# Patient Record
Sex: Male | Born: 1955 | Race: Black or African American | Hispanic: No | Smoking: Current every day smoker
Health system: Southern US, Community
[De-identification: ages and names within clinical notes are randomized; demographics above are authoritative.]

## PROBLEM LIST (undated history)

## (undated) DIAGNOSIS — I509 Heart failure, unspecified: Secondary | ICD-10-CM

## (undated) DIAGNOSIS — I10 Essential (primary) hypertension: Secondary | ICD-10-CM

## (undated) DIAGNOSIS — E119 Type 2 diabetes mellitus without complications: Secondary | ICD-10-CM

## (undated) HISTORY — PX: CARDIAC VALVE REPLACEMENT: SHX585

---

## 2017-05-26 ENCOUNTER — Encounter (HOSPITAL_COMMUNITY): Payer: Self-pay

## 2017-05-26 ENCOUNTER — Other Ambulatory Visit: Payer: Self-pay

## 2017-05-26 ENCOUNTER — Emergency Department (HOSPITAL_COMMUNITY)
Admission: EM | Admit: 2017-05-26 | Discharge: 2017-05-26 | Disposition: A | Discharge status: Home / Self Care | Payer: Medicare Other | Attending: Emergency Medicine | Admitting: Emergency Medicine

## 2017-05-26 DIAGNOSIS — R51 Headache: Secondary | ICD-10-CM | POA: Insufficient documentation

## 2017-05-26 DIAGNOSIS — R112 Nausea with vomiting, unspecified: Secondary | ICD-10-CM | POA: Insufficient documentation

## 2017-05-26 DIAGNOSIS — R197 Diarrhea, unspecified: Secondary | ICD-10-CM | POA: Diagnosis not present

## 2017-05-26 DIAGNOSIS — E876 Hypokalemia: Secondary | ICD-10-CM | POA: Insufficient documentation

## 2017-05-26 DIAGNOSIS — F17210 Nicotine dependence, cigarettes, uncomplicated: Secondary | ICD-10-CM | POA: Diagnosis not present

## 2017-05-26 DIAGNOSIS — E871 Hypo-osmolality and hyponatremia: Secondary | ICD-10-CM

## 2017-05-26 DIAGNOSIS — E119 Type 2 diabetes mellitus without complications: Secondary | ICD-10-CM | POA: Insufficient documentation

## 2017-05-26 DIAGNOSIS — I10 Essential (primary) hypertension: Secondary | ICD-10-CM | POA: Insufficient documentation

## 2017-05-26 DIAGNOSIS — R252 Cramp and spasm: Secondary | ICD-10-CM | POA: Diagnosis not present

## 2017-05-26 DIAGNOSIS — N289 Disorder of kidney and ureter, unspecified: Secondary | ICD-10-CM

## 2017-05-26 DIAGNOSIS — I509 Heart failure, unspecified: Secondary | ICD-10-CM | POA: Insufficient documentation

## 2017-05-26 HISTORY — DX: Heart failure, unspecified: I50.9

## 2017-05-26 HISTORY — DX: Type 2 diabetes mellitus without complications: E11.9

## 2017-05-26 HISTORY — DX: Essential (primary) hypertension: I10

## 2017-05-26 LAB — BASIC METABOLIC PANEL
ANION GAP: 12 (ref 5–15)
BUN: 61 mg/dL — AB (ref 6–20)
CHLORIDE: 92 mmol/L — AB (ref 101–111)
CO2: 25 mmol/L (ref 22–32)
Calcium: 9.7 mg/dL (ref 8.9–10.3)
Creatinine, Ser: 2.37 mg/dL — ABNORMAL HIGH (ref 0.61–1.24)
GFR, EST AFRICAN AMERICAN: 32 mL/min — AB (ref >60)
GFR, EST NON AFRICAN AMERICAN: 28 mL/min — AB (ref >60)
Glucose, Bld: 209 mg/dL — ABNORMAL HIGH (ref 65–99)
POTASSIUM: 5.1 mmol/L (ref 3.5–5.1)
SODIUM: 129 mmol/L — AB (ref 135–145)

## 2017-05-26 LAB — CBC WITH DIFFERENTIAL/PLATELET
BASOS ABS: 0 10*3/uL (ref 0.0–0.1)
Basophils Relative: 0 %
EOS PCT: 3 %
Eosinophils Absolute: 0.2 10*3/uL (ref 0.0–0.7)
HCT: 50 % (ref 39.0–52.0)
HEMOGLOBIN: 17.2 g/dL — AB (ref 13.0–17.0)
LYMPHS PCT: 16 %
Lymphs Abs: 1.3 10*3/uL (ref 0.7–4.0)
MCH: 27.3 pg (ref 26.0–34.0)
MCHC: 34.4 g/dL (ref 30.0–36.0)
MCV: 79.2 fL (ref 78.0–100.0)
Monocytes Absolute: 0.5 10*3/uL (ref 0.1–1.0)
Monocytes Relative: 6 %
NEUTROS PCT: 75 %
Neutro Abs: 6.1 10*3/uL (ref 1.7–7.7)
PLATELETS: 251 10*3/uL (ref 150–400)
RBC: 6.31 MIL/uL — AB (ref 4.22–5.81)
RDW: 14.3 % (ref 11.5–15.5)
WBC: 8.1 10*3/uL (ref 4.0–10.5)

## 2017-05-26 MED ORDER — SODIUM CHLORIDE 0.9 % IV BOLUS
500.0000 mL | Freq: Once | INTRAVENOUS | Status: AC
  Administered 2017-05-26: 500 mL via INTRAVENOUS

## 2017-05-26 MED ORDER — ACETAMINOPHEN 500 MG PO TABS
1000.0000 mg | ORAL_TABLET | Freq: Once | ORAL | Status: AC
  Administered 2017-05-26: 1000 mg via ORAL
  Filled 2017-05-26: qty 2

## 2017-05-26 NOTE — Discharge Instructions (Addendum)
Your sodium was low at 129.  Your creatinine (kidney function) was elevated at 2.37.  You need to make an appointment within the next few days to follow up with your primary care provider at the [facility] and you will need to have these labs rechecked.  Return here if you have any worsening symptoms.

## 2017-05-26 NOTE — ED Triage Notes (Signed)
Per EMS- patient c/o bilateral feet, leg, and hand cramping. Patient had called EMS earlier today and decided not to come to the ED at that time.

## 2017-05-26 NOTE — ED Provider Notes (Signed)
[facility] [place] [place] DEPT Provider Note   CSN: [id number] Arrival date & time: 05/26/17  [place]     History   Chief Complaint Chief Complaint  Patient presents with  . leg cramping    HPI Kyle Newton is a 62 y.o. male.  Patient is a 62 year old male with a history of diabetes, hypertension and CHF who presents with muscle cramping.  He reports a 1 day history of cramping in his lower legs and both of his hands.  He states he has had this before but does not know what led to it.  He denies any leg swelling.  No chest pain or shortness of breath.  He states he had one episode of vomiting and one episode of diarrhea earlier today but no ongoing symptoms.  No fevers.  No cough or cold symptoms.  No abdominal pain.  He does say he has a bifrontal headache which also started today.  He denies any URI symptoms.  No dizziness.  He has not taken anything at home for the pain     Past Medical History:  Diagnosis Date  . CHF (congestive heart failure) ([place])   . Diabetes mellitus without complication ([place])   . Hypertension     There are no active problems to display for this patient.   Past Surgical History:  Procedure Laterality Date  . CARDIAC SURGERY          Home Medications    Prior to Admission medications   Not on File    Family History Family History  Problem Relation Age of Onset  . Heart failure Mother   . Diabetes Mother   . Diabetes Father     Social History Social History   Tobacco Use  . Smoking status: Current Every Day Smoker    Packs/day: 1.00    Types: Cigarettes  . Smokeless tobacco: Never Used  Substance Use Topics  . Alcohol use: Not Currently  . Drug use: Not Currently    Types: Marijuana, Cocaine     Allergies   Patient has no known allergies.   Review of Systems Review of Systems  Constitutional: Negative for chills, diaphoresis, fatigue and fever.  HENT: Negative for congestion, rhinorrhea and sneezing.     Eyes: Negative.   Respiratory: Negative for cough, chest tightness and shortness of breath.   Cardiovascular: Negative for chest pain and leg swelling.  Gastrointestinal: Positive for diarrhea, nausea and vomiting. Negative for abdominal pain and blood in stool.  Genitourinary: Negative for difficulty urinating, flank pain, frequency and hematuria.  Musculoskeletal: Positive for myalgias. Negative for arthralgias and back pain.  Skin: Negative for rash.  Neurological: Positive for headaches. Negative for dizziness, speech difficulty, weakness and numbness.     Physical Exam Updated Vital Signs BP (!) 127/96   Pulse (!) 59   Temp 98 F (36.7 C) (Oral)   Resp 14   Ht 5\' 9"  (1.753 m)   Wt 73.9 kg (163 lb)   SpO2 100%   BMI 24.07 kg/m   Physical Exam  Constitutional: He is oriented to person, place, and time. He appears well-developed and well-nourished.  HENT:  Head: Normocephalic and atraumatic.  Eyes: Pupils are equal, round, and reactive to light.  Neck: Normal range of motion. Neck supple.  Cardiovascular: Normal rate, regular rhythm and normal heart sounds.  Pulmonary/Chest: Effort normal and breath sounds normal. No respiratory distress. He has no wheezes. He has no rales. He exhibits no tenderness.  Abdominal: Soft. Bowel  sounds are normal. There is no tenderness. There is no rebound and no guarding.  Musculoskeletal: Normal range of motion. He exhibits no edema.  Normal color, pedal pulses intact  Lymphadenopathy:    He has no cervical adenopathy.  Neurological: He is alert and oriented to person, place, and time. He has normal strength. No sensory deficit.  Skin: Skin is warm and dry. No rash noted.  Psychiatric: He has a normal mood and affect.     ED Treatments / Results  Labs (all labs ordered are listed, but only abnormal results are displayed) Labs Reviewed  BASIC METABOLIC PANEL - Abnormal; Notable for the following components:      Result Value   Sodium  129 (*)    Chloride 92 (*)    Glucose, Bld 209 (*)    BUN 61 (*)    Creatinine, Ser 2.37 (*)    GFR calc non Af Amer 28 (*)    GFR calc Af Amer 32 (*)    All other components within normal limits  CBC WITH DIFFERENTIAL/PLATELET - Abnormal; Notable for the following components:   RBC 6.31 (*)    Hemoglobin 17.2 (*)    All other components within normal limits    EKG None  Radiology No results found.  Procedures Procedures (including critical care time)  Medications Ordered in ED Medications  sodium chloride 0.9 % bolus 500 mL (0 mLs Intravenous Stopped 05/26/17 2304)  acetaminophen (TYLENOL) tablet 1,000 mg (1,000 mg Oral Given 05/26/17 2123)     Initial Impression / Assessment and Plan / ED Course  I have reviewed the triage vital signs and the nursing notes.  Pertinent labs & imaging results that were available during my care of the patient were reviewed by me and considered in my medical decision making (see chart for details).     Patient is a 62 year old male who presents with cramping of his hands and legs as well as a bifrontal type headache.  His headache completely resolved with a dose of Tylenol.  He was given 500 cc of normal saline.  He does not know what medications that he is on other than Lasix.  He does have hyponatremia with a sodium of 129 and an elevated creatinine at 2.37.  I do not know his baseline values are as he gets all of his care through the [facility] hospital.  Currently he is completely asymptomatic.  He states he only had one episode of vomiting and diarrhea earlier today.  Given this, I would doubt that he has an acute kidney injury.  He does not have a headache anymore.  He denies any recent head trauma.  He is neurologically intact.  He was discharged home in good condition.  He was encouraged to have close follow-up with his PCP.  I did write his lab values down on his discharge papers and advised that he will need to have his closely follow.  Return  precautions were given.  Final Clinical Impressions(s) / ED Diagnoses   Final diagnoses:  Muscle cramps  Hyponatremia  Renal insufficiency    ED Discharge Orders    None       Malvin Johns, MD 05/26/17 2325

## 2017-06-17 ENCOUNTER — Emergency Department (HOSPITAL_COMMUNITY): Payer: Medicare Other

## 2017-06-17 ENCOUNTER — Encounter (HOSPITAL_COMMUNITY): Payer: Self-pay | Admitting: Emergency Medicine

## 2017-06-17 ENCOUNTER — Inpatient Hospital Stay (HOSPITAL_COMMUNITY)
Admission: EM | Admit: 2017-06-17 | Discharge: 2017-06-21 | DRG: 638 | Disposition: A | Discharge status: Home / Self Care | Payer: Medicare Other | Attending: Student in an Organized Health Care Education/Training Program | Admitting: Student in an Organized Health Care Education/Training Program

## 2017-06-17 DIAGNOSIS — R1013 Epigastric pain: Secondary | ICD-10-CM

## 2017-06-17 DIAGNOSIS — R739 Hyperglycemia, unspecified: Secondary | ICD-10-CM | POA: Diagnosis present

## 2017-06-17 DIAGNOSIS — J9811 Atelectasis: Secondary | ICD-10-CM | POA: Diagnosis present

## 2017-06-17 DIAGNOSIS — R1084 Generalized abdominal pain: Secondary | ICD-10-CM

## 2017-06-17 DIAGNOSIS — I4892 Unspecified atrial flutter: Secondary | ICD-10-CM | POA: Diagnosis present

## 2017-06-17 DIAGNOSIS — I110 Hypertensive heart disease with heart failure: Secondary | ICD-10-CM | POA: Diagnosis present

## 2017-06-17 DIAGNOSIS — E1165 Type 2 diabetes mellitus with hyperglycemia: Secondary | ICD-10-CM | POA: Diagnosis not present

## 2017-06-17 DIAGNOSIS — Z79899 Other long term (current) drug therapy: Secondary | ICD-10-CM

## 2017-06-17 DIAGNOSIS — Z7901 Long term (current) use of anticoagulants: Secondary | ICD-10-CM

## 2017-06-17 DIAGNOSIS — I509 Heart failure, unspecified: Secondary | ICD-10-CM | POA: Diagnosis present

## 2017-06-17 DIAGNOSIS — Z950 Presence of cardiac pacemaker: Secondary | ICD-10-CM

## 2017-06-17 DIAGNOSIS — B86 Scabies: Secondary | ICD-10-CM

## 2017-06-17 DIAGNOSIS — Z8249 Family history of ischemic heart disease and other diseases of the circulatory system: Secondary | ICD-10-CM

## 2017-06-17 DIAGNOSIS — G40909 Epilepsy, unspecified, not intractable, without status epilepticus: Secondary | ICD-10-CM | POA: Diagnosis present

## 2017-06-17 DIAGNOSIS — Z794 Long term (current) use of insulin: Secondary | ICD-10-CM

## 2017-06-17 DIAGNOSIS — F17210 Nicotine dependence, cigarettes, uncomplicated: Secondary | ICD-10-CM | POA: Diagnosis present

## 2017-06-17 DIAGNOSIS — E860 Dehydration: Secondary | ICD-10-CM | POA: Diagnosis present

## 2017-06-17 DIAGNOSIS — I4430 Unspecified atrioventricular block: Secondary | ICD-10-CM | POA: Diagnosis present

## 2017-06-17 DIAGNOSIS — L299 Pruritus, unspecified: Secondary | ICD-10-CM | POA: Diagnosis present

## 2017-06-17 DIAGNOSIS — Z590 Homelessness: Secondary | ICD-10-CM

## 2017-06-17 DIAGNOSIS — Z952 Presence of prosthetic heart valve: Secondary | ICD-10-CM

## 2017-06-17 DIAGNOSIS — Z833 Family history of diabetes mellitus: Secondary | ICD-10-CM

## 2017-06-17 DIAGNOSIS — R935 Abnormal findings on diagnostic imaging of other abdominal regions, including retroperitoneum: Secondary | ICD-10-CM

## 2017-06-17 LAB — CBC WITH DIFFERENTIAL/PLATELET
ABS IMMATURE GRANULOCYTES: 0 10*3/uL (ref 0.0–0.1)
Basophils Absolute: 0 10*3/uL (ref 0.0–0.1)
Basophils Relative: 1 %
EOS PCT: 3 %
Eosinophils Absolute: 0.1 10*3/uL (ref 0.0–0.7)
HCT: 51.7 % (ref 39.0–52.0)
HEMOGLOBIN: 17.2 g/dL — AB (ref 13.0–17.0)
Immature Granulocytes: 0 %
LYMPHS PCT: 25 %
Lymphs Abs: 1.2 10*3/uL (ref 0.7–4.0)
MCH: 25.7 pg — AB (ref 26.0–34.0)
MCHC: 33.3 g/dL (ref 30.0–36.0)
MCV: 77.3 fL — ABNORMAL LOW (ref 78.0–100.0)
MONO ABS: 0.5 10*3/uL (ref 0.1–1.0)
MONOS PCT: 11 %
NEUTROS ABS: 2.8 10*3/uL (ref 1.7–7.7)
Neutrophils Relative %: 60 %
Platelets: 193 10*3/uL (ref 150–400)
RBC: 6.69 MIL/uL — ABNORMAL HIGH (ref 4.22–5.81)
RDW: 15.7 % — ABNORMAL HIGH (ref 11.5–15.5)
WBC: 4.7 10*3/uL (ref 4.0–10.5)

## 2017-06-17 LAB — COMPREHENSIVE METABOLIC PANEL
ALK PHOS: 195 U/L — AB (ref 38–126)
ALT: 21 U/L (ref 17–63)
AST: 34 U/L (ref 15–41)
Albumin: 3.2 g/dL — ABNORMAL LOW (ref 3.5–5.0)
Anion gap: 9 (ref 5–15)
BUN: 45 mg/dL — ABNORMAL HIGH (ref 6–20)
CALCIUM: 9.3 mg/dL (ref 8.9–10.3)
CO2: 26 mmol/L (ref 22–32)
CREATININE: 2.51 mg/dL — AB (ref 0.61–1.24)
Chloride: 88 mmol/L — ABNORMAL LOW (ref 101–111)
GFR, EST AFRICAN AMERICAN: 30 mL/min — AB (ref >60)
GFR, EST NON AFRICAN AMERICAN: 26 mL/min — AB (ref >60)
Glucose, Bld: 669 mg/dL (ref 65–99)
Potassium: 5.2 mmol/L — ABNORMAL HIGH (ref 3.5–5.1)
Sodium: 123 mmol/L — ABNORMAL LOW (ref 135–145)
TOTAL PROTEIN: 8.2 g/dL — AB (ref 6.5–8.1)
Total Bilirubin: 0.8 mg/dL (ref 0.3–1.2)

## 2017-06-17 LAB — CBG MONITORING, ED
GLUCOSE-CAPILLARY: 505 mg/dL — AB (ref 65–99)
Glucose-Capillary: 181 mg/dL — ABNORMAL HIGH (ref 65–99)
Glucose-Capillary: 212 mg/dL — ABNORMAL HIGH (ref 65–99)
Glucose-Capillary: 235 mg/dL — ABNORMAL HIGH (ref 65–99)
Glucose-Capillary: 335 mg/dL — ABNORMAL HIGH (ref 65–99)
Glucose-Capillary: 447 mg/dL — ABNORMAL HIGH (ref 65–99)
Glucose-Capillary: >600 mg/dL (ref 65–99)

## 2017-06-17 LAB — BASIC METABOLIC PANEL
Anion gap: 10 (ref 5–15)
BUN: 40 mg/dL — ABNORMAL HIGH (ref 6–20)
CALCIUM: 9.1 mg/dL (ref 8.9–10.3)
CHLORIDE: 95 mmol/L — AB (ref 101–111)
CO2: 22 mmol/L (ref 22–32)
Creatinine, Ser: 2.04 mg/dL — ABNORMAL HIGH (ref 0.61–1.24)
GFR calc Af Amer: 39 mL/min — ABNORMAL LOW (ref >60)
GFR calc non Af Amer: 33 mL/min — ABNORMAL LOW (ref >60)
GLUCOSE: 344 mg/dL — AB (ref 65–99)
Potassium: 4 mmol/L (ref 3.5–5.1)
Sodium: 127 mmol/L — ABNORMAL LOW (ref 135–145)

## 2017-06-17 LAB — MAGNESIUM: Magnesium: 2.2 mg/dL (ref 1.7–2.4)

## 2017-06-17 LAB — I-STAT CHEM 8, ED
BUN: 43 mg/dL — ABNORMAL HIGH (ref 6–20)
CALCIUM ION: 1.13 mmol/L — AB (ref 1.15–1.40)
CHLORIDE: 88 mmol/L — AB (ref 101–111)
CREATININE: 2.2 mg/dL — AB (ref 0.61–1.24)
GLUCOSE: 649 mg/dL — AB (ref 65–99)
HCT: 57 % — ABNORMAL HIGH (ref 39.0–52.0)
Hemoglobin: 19.4 g/dL — ABNORMAL HIGH (ref 13.0–17.0)
POTASSIUM: 5.2 mmol/L — AB (ref 3.5–5.1)
Sodium: 125 mmol/L — ABNORMAL LOW (ref 135–145)
TCO2: 25 mmol/L (ref 22–32)

## 2017-06-17 LAB — RAPID HIV SCREEN (HIV 1/2 AB+AG)
HIV 1/2 Antibodies: NONREACTIVE
HIV-1 P24 ANTIGEN - HIV24: NONREACTIVE

## 2017-06-17 LAB — LIPASE, BLOOD: Lipase: 109 U/L — ABNORMAL HIGH (ref 11–51)

## 2017-06-17 LAB — I-STAT TROPONIN, ED: TROPONIN I, POC: 0 ng/mL (ref 0.00–0.08)

## 2017-06-17 MED ORDER — ACETAMINOPHEN 650 MG RE SUPP: 650.0000 mg | Freq: Four times a day (QID) | RECTAL | Status: DC | PRN

## 2017-06-17 MED ORDER — SODIUM CHLORIDE 0.9 % IV SOLN
INTRAVENOUS | Status: DC
  Administered 2017-06-17: 4.5 [IU]/h via INTRAVENOUS
  Filled 2017-06-17: qty 1

## 2017-06-17 MED ORDER — HYDROCORTISONE 1 % EX CREA
1.0000 | TOPICAL_CREAM | Freq: Every evening | CUTANEOUS | Status: DC | PRN
Start: 2017-06-18 — End: 2017-06-21
  Filled 2017-06-17: qty 28

## 2017-06-17 MED ORDER — ESCITALOPRAM OXALATE 10 MG PO TABS
10.0000 mg | ORAL_TABLET | Freq: Every day | ORAL | Status: DC
  Administered 2017-06-18 – 2017-06-21 (×4): 10 mg via ORAL
  Filled 2017-06-17 (×4): qty 1

## 2017-06-17 MED ORDER — ACETAMINOPHEN 325 MG PO TABS
650.0000 mg | ORAL_TABLET | Freq: Four times a day (QID) | ORAL | Status: DC | PRN
  Administered 2017-06-19 – 2017-06-20 (×2): 650 mg via ORAL
  Filled 2017-06-17 (×2): qty 2

## 2017-06-17 MED ORDER — SODIUM CHLORIDE 0.9 % IV BOLUS
500.0000 mL | Freq: Once | INTRAVENOUS | Status: AC
  Administered 2017-06-17: 500 mL via INTRAVENOUS

## 2017-06-17 MED ORDER — INSULIN GLARGINE 100 UNIT/ML ~~LOC~~ SOLN
10.0000 [IU] | Freq: Every day | SUBCUTANEOUS | Status: DC
  Administered 2017-06-18 (×2): 10 [IU] via SUBCUTANEOUS
  Filled 2017-06-17 (×3): qty 0.1

## 2017-06-17 MED ORDER — LEVETIRACETAM 500 MG PO TABS
500.0000 mg | ORAL_TABLET | Freq: Every day | ORAL | Status: DC
  Administered 2017-06-18 – 2017-06-21 (×4): 500 mg via ORAL
  Filled 2017-06-17 (×4): qty 1

## 2017-06-17 MED ORDER — DIPHENHYDRAMINE HCL 25 MG PO CAPS
25.0000 mg | ORAL_CAPSULE | Freq: Every day | ORAL | Status: DC | PRN
  Administered 2017-06-20: 25 mg via ORAL
  Filled 2017-06-17: qty 1

## 2017-06-17 MED ORDER — SODIUM CHLORIDE 0.9 % IV SOLN: INTRAVENOUS | Status: DC

## 2017-06-17 MED ORDER — SODIUM CHLORIDE 0.9 % IV BOLUS
1000.0000 mL | Freq: Once | INTRAVENOUS | Status: AC
  Administered 2017-06-17: 1000 mL via INTRAVENOUS

## 2017-06-17 MED ORDER — RIVAROXABAN 15 MG PO TABS
15.0000 mg | ORAL_TABLET | Freq: Every day | ORAL | Status: DC
  Administered 2017-06-18 – 2017-06-20 (×4): 15 mg via ORAL
  Filled 2017-06-17 (×4): qty 1

## 2017-06-17 MED ORDER — SENNOSIDES-DOCUSATE SODIUM 8.6-50 MG PO TABS
1.0000 | ORAL_TABLET | Freq: Every evening | ORAL | Status: DC | PRN
Start: 2017-06-17 — End: 2017-06-21

## 2017-06-17 MED ORDER — IOPAMIDOL (ISOVUE-300) INJECTION 61%
INTRAVENOUS | Status: AC
  Filled 2017-06-17: qty 30

## 2017-06-17 NOTE — ED Notes (Signed)
Dr.Long made aware of pt chem8 lab results. ED-Lab.,

## 2017-06-17 NOTE — H&P (Addendum)
Date: 06/17/2017               Patient Name:  Kyle Newton MRN: [id number]  DOB: [birth year] Age / Sex: 62 y.o., male   PCP: Patient, No Pcp Per         Medical Service: Internal Medicine Teaching Service         Attending Physician: Dr. Evette Doffing, Mallie Mussel, *    First Contact: Dr. Maricela Bo Pager: [phone]  Second Contact: Dr. Philipp Ovens Pager: [phone]       After Hours (After 5p/  First Contact Pager: [phone]  weekends / holidays): Second Contact Pager: [phone]   Chief Complaint: Fatigue, Abdominal Discomfort, Elevated Glucose  History of Present Illness: Kyle Newton is a 62 yo M with Hx of CHF, Diabetes, HTN, Seizure,  And S/P Pacemaker placement who presented with 1 day of fatigue, abdominal discomfort and 1 week of hyperglycemia from his home at [facility]. Patient states that he has run out of his Insulin and has not taken this since last Friday. His blood sugar had been in the 300s-400s over the past week and his meter read "high" since yesterday. He also has been experiencing 1 day of fatigue and abdominal discomfort. His pain is described as a waxing and waning, 4-8/10, crampy, non-radiating, periumbilical pain. The pain is relieved following sleep. The pain is not associated with eating. Patient endorses intermittent nausea for the past couple of days with an episode of vomiting yesterday; he also endorses muscle cramps for the past 2 days, and mild intermittent shortness of breath. He denies fevers, chills, chest pain. Of, note patient evidently receives treatment at [facility], in case records need to be obtained.  In the ED, Patient's vitals were stable. CBC showed Hgb 17.2 (same as 3 weeks prior); BMP showed Na 123 (corrects to 132), Cl 88, BUN 45, Cr 2.51, and Glucose 669; Lipase elevated at 109; Mg WNL, HIVE negative, and Troponin Negative. EKG showed Atrial flutter with varied AV block, Nonspecific IVCD, Abnormal T-waves, No previous ECGs available. CXR showed Small right pleural  effusion w/ associated atelectasis. CT showed R pleural effusion (loculated), Heterogenous pancreatic head, and duodenitis. GI was consulted and in the ED and will be seeing the patient in the morning. Patient recieved 1.5L IVF and was placed on Insulin gtt. He is to be admitted for further work up and care.  Meds:  Current Meds  Medication Sig  . atorvastatin (LIPITOR) 10 MG tablet Take 10 mg by mouth at bedtime.  . carvedilol (COREG) 12.5 MG tablet Take 6.25 mg by mouth 2 (two) times daily with a meal.  . diphenhydrAMINE (BENADRYL) 25 MG tablet Take 25 mg by mouth daily as needed for itching.  . escitalopram (LEXAPRO) 10 MG tablet Take 10 mg by mouth daily.  . furosemide (LASIX) 80 MG tablet Take 80 mg by mouth 2 (two) times daily.  . hydrocortisone cream 1 % Apply 1 application topically at bedtime as needed for itching.  . insulin aspart (NOVOLOG FLEXPEN) 100 UNIT/ML FlexPen Inject 4 Units into the skin 3 (three) times daily with meals.  . Insulin Glargine (LANTUS SOLOSTAR) 100 UNIT/ML Solostar Pen Inject 10 Units into the skin at bedtime.  . isosorbide-hydrALAZINE (BIDIL) 20-37.5 MG tablet Take 1 tablet by mouth 3 (three) times daily.  Marland Kitchen levETIRAcetam (KEPPRA) 500 MG tablet Take 500 mg by mouth daily.  . Rivaroxaban (XARELTO) 15 MG TABS tablet Take 15 mg by mouth at bedtime.   Allergies: Allergies  as of 06/17/2017  . (No Known Allergies)   Past Medical History:  Diagnosis Date  . CHF (congestive heart failure) ([place])   . Diabetes mellitus without complication ([place])   . Hypertension     Family History: Family History  Problem Relation Age of Onset  . Heart failure Mother   . Diabetes Mother   . Diabetes Father   - Reviewed on admission  Social History: Social History   Tobacco Use  . Smoking status: Current Every Day Smoker    Packs/day: 1.00    Types: Cigarettes  . Smokeless tobacco: Never Used  Substance Use Topics  . Alcohol use: Not Currently  . Drug use: Not  Currently    Types: Marijuana, Cocaine  - Reviewed on admission  Review of Systems: A complete ROS was negative except as per HPI.  Physical Exam: Blood pressure 109/89, pulse 60, temperature 97.8 F (36.6 C), temperature source Oral, resp. rate 13, SpO2 98 %. Physical Exam  Constitutional: He is oriented to person, place, and time. He appears well-developed and well-nourished.  Mildly distress due to cramping  HENT:  Head: Normocephalic and atraumatic.  Eyes: EOM are normal. Right eye exhibits no discharge. Left eye exhibits no discharge.  Cardiovascular: Normal rate, regular rhythm and intact distal pulses.  Systolic murmur  Pulmonary/Chest: Effort normal. No respiratory distress.  Decreased breath sounds Right Base  Abdominal: Soft. Bowel sounds are normal. He exhibits no distension.  Tender to palpation of periumbilical region  Musculoskeletal: He exhibits no edema or deformity.  Neurological: He is alert and oriented to person, place, and time.  Skin: Skin is warm and dry.  Chronic excoriations of trunk and arms    EKG:   EKG Interpretation  Date/Time:  Saturday Jun 17 2017 14:18:57 EDT Ventricular Rate:  63 PR Interval:    QRS Duration: 187 QT Interval:  478 QTC Calculation: 490 R Axis:   -96 Text Interpretation:  Atrial flutter with varied AV block, Nonspecific IVCD with LAD Consider left ventricular hypertrophy Abnormal T, probable ischemia, lateral leads No previous ECGs available Confirmed by Wandra Arthurs [phone]) on 06/17/2017 2:21:39 PM Also confirmed by Wandra Arthurs [phone]), editor Philomena Doheny [phone])  on 06/17/2017 4:39:38 PM      CXR: personally reviewed my interpretation is R pleural effusion with associated atelectasis, Pacemaker in place, s/p aortic valve replacement.  CT Abdomen/Pelvis: IMPRESSION: - Right lower lobe atelectasis and bronchiectasis with loculated right pleural effusion. - Mild cholelithiasis. - Heterogeneous appearance of the head of  the pancreas. Correlation with serum pancreatic enzymes is recommended. - Irregular mucosal thickening of the second and third portions of the duodenum. This may represent segmental duodenitis, however malignancy cannot be excluded. GI consult may be considered.   Assessment & Plan by Problem: Active Problems:   Hyperglycemia  HHS Diabetes: Patient presented having been off his insulin for 1 week with glucose in the 300-400s in that time with his meter reading "high" for the past 2 days. Endorses increased urination. Has been experiencing cramps for 2 days, likely 2/2 dehydration. In the ED patient was found to have Glucose of 669 and was placed on Insulin gtt and given 1.5L of IVF. He was not acidotic with Bicarb of 26 and has a normal qanion gap at 9.  - Continue Insulin gtt - IVF @ 100cc/hr, want to replete carefully given patient's history of heart failure (and no echo on file) - Once BS < 250, will give home Lantus 10U and  continue gtt for additional hour then discontinue - CBG monitoring - AM CBC  Abdominal Pain: Patient present with periumbilical pain and 2 day hist of nausea with 1 episode of vomiting. He was found to have heterogenous appreance pancreatic head on CT along with mild chololithiasis and Lipase moderately elevated in the ED to 109 (2x ULN). Possibly experiencing mild pancreatitis given lipase and CT findings, though pain is located at umbilicus and is not associated with eating. CT also noted mucosal thickening at duodenum that may represent duodenitis, but malignancy cannot be excluded. - GI consulted in the ED reviewed chart and will see patient in AM. - IVF - Continue to monitor  Pleural Effusion: Patient endorsed recent mild shortness of breath. Saturating well on room air in ED. Effusion noted on CXR and reported as loculated on CT.  Muscle Cramping: Likely 2/2 dehydration from hyperglycemia. Though patient did present with cramps on 5/3 prior to stopping his  insulin.  S/P Aortic valve Replacement: Per patient, valve replaced in 2006 or 2007 at [facility]. He believe the valve is metal - Continue home Xarelto  CHF: History of CHF, no Echo on file here. On Coreg 6.25 BID, Lasix 80 BID, and BiDil 20-37.5 TID at home. - Holding Lasix, Coreg and Bidil in the setting of HHS and Dehydration   HTN: Patient Normotensive here. Takes lasix, BiDil and Coreg at home for CHF. - Holding home meds  Hx of Seizure: Patient reports history of seizure, on Keppra at home. - Continue home Keppra 500mg  Daily  Chronic Itching: - Continue home benadryl and hydrocortisone cream  FEN: 100cc/hr, CM diet VTE ppx: Lovenox Code Status: FULL  Dispo: Admit patient to Observation with expected length of stay less than 2 midnights.  Signed: Neva Seat, MD 06/17/2017, 9:58 PM  Pager: [phone]

## 2017-06-17 NOTE — ED Notes (Signed)
Ordered carb mod tray

## 2017-06-17 NOTE — ED Notes (Signed)
CALLED [place] TO ASK ABOUT DINNER TRAY

## 2017-06-17 NOTE — ED Notes (Signed)
Pt is requesting something to eat

## 2017-06-17 NOTE — ED Provider Notes (Signed)
Emergency Department Provider Note   I have reviewed the triage vital signs and the nursing notes.   HISTORY  Chief Complaint Hyperglycemia and Fatigue   HPI Kyle Newton is a 62 y.o. male with PMH of CHF, pacemaker in place, DM, and HTN resents to the emergency department for evaluation of fatigue with abdominal discomfort, and hyperglycemia.  The patient has been compliant with his insulin except for today.  Patient states he did not take it because he woke up feeling fatigued.  He does notice some mild abdominal discomfort which is worse with pressing in the area.  No change with eating.  No radiation of symptoms.  No other modifying factors.  Patient denies any chest pain.  He has ongoing dyspnea which is not worse than normal.  He denies any unilateral numbness or weakness.   Past Medical History:  Diagnosis Date  . CHF (congestive heart failure) ([place])   . Diabetes mellitus without complication ([place])   . Hypertension     There are no active problems to display for this patient.   Past Surgical History:  Procedure Laterality Date  . CARDIAC SURGERY        Allergies Patient has no known allergies.  Family History  Problem Relation Age of Onset  . Heart failure Mother   . Diabetes Mother   . Diabetes Father     Social History Social History   Tobacco Use  . Smoking status: Current Every Day Smoker    Packs/day: 1.00    Types: Cigarettes  . Smokeless tobacco: Never Used  Substance Use Topics  . Alcohol use: Not Currently  . Drug use: Not Currently    Types: Marijuana, Cocaine    Review of Systems  Constitutional: No fever/chills. Positive generalized fatigue.  Eyes: No visual changes. ENT: No sore throat. Cardiovascular: Denies chest pain. Respiratory: Positive chronic shortness of breath. Gastrointestinal: Positive abdominal pain.  No nausea, no vomiting.  No diarrhea.  No constipation. Genitourinary: Negative for dysuria. Musculoskeletal:  Negative for back pain. Skin: Negative for rash. Neurological: Negative for headaches, focal weakness or numbness.  10-point ROS otherwise negative.  ____________________________________________   PHYSICAL EXAM:  VITAL SIGNS: ED Triage Vitals  Enc Vitals Group     BP 06/17/17 1430 105/84     Pulse Rate 06/17/17 1430 61     Resp 06/17/17 1430 11     Temp 06/17/17 1436 97.8 F (36.6 C)     Temp Source 06/17/17 1436 Oral     SpO2 06/17/17 1430 99 %     Pain Score 06/17/17 1436 0   Constitutional: Alert and oriented. Well appearing but cachectic.  Eyes: Conjunctivae are normal.  Head: Atraumatic. Nose: No congestion/rhinnorhea. Mouth/Throat: Mucous membranes are dry.  Neck: No stridor.   Cardiovascular: Normal rate, regular rhythm. Good peripheral circulation. Grossly normal heart sounds.   Respiratory: Normal respiratory effort.  No retractions. Lungs CTAB. Gastrointestinal: Soft and nontender. No distention.  Musculoskeletal: No lower extremity tenderness nor edema. No gross deformities of extremities. Neurologic:  Normal speech and language. No gross focal neurologic deficits are appreciated.  Skin:  Skin is warm, dry and intact. No rash noted.  ____________________________________________   LABS (all labs ordered are listed, but only abnormal results are displayed)  Labs Reviewed  CBC WITH DIFFERENTIAL/PLATELET - Abnormal; Notable for the following components:      Result Value   RBC 6.69 (*)    Hemoglobin 17.2 (*)    MCV 77.3 (*)  MCH 25.7 (*)    RDW 15.7 (*)    All other components within normal limits  COMPREHENSIVE METABOLIC PANEL - Abnormal; Notable for the following components:   Sodium 123 (*)    Potassium 5.2 (*)    Chloride 88 (*)    Glucose, Bld 669 (*)    BUN 45 (*)    Creatinine, Ser 2.51 (*)    Total Protein 8.2 (*)    Albumin 3.2 (*)    Alkaline Phosphatase 195 (*)    GFR calc non Af Amer 26 (*)    GFR calc Af Amer 30 (*)    All other  components within normal limits  LIPASE, BLOOD - Abnormal; Notable for the following components:   Lipase 109 (*)    All other components within normal limits  I-STAT CHEM 8, ED - Abnormal; Notable for the following components:   Sodium 125 (*)    Potassium 5.2 (*)    Chloride 88 (*)    BUN 43 (*)    Creatinine, Ser 2.20 (*)    Glucose, Bld 649 (*)    Calcium, Ion 1.13 (*)    Hemoglobin 19.4 (*)    HCT 57.0 (*)    All other components within normal limits  CBG MONITORING, ED - Abnormal; Notable for the following components:   Glucose-Capillary >600 (*)    All other components within normal limits  CBG MONITORING, ED - Abnormal; Notable for the following components:   Glucose-Capillary 505 (*)    All other components within normal limits  CBG MONITORING, ED - Abnormal; Notable for the following components:   Glucose-Capillary 447 (*)    All other components within normal limits  MAGNESIUM  RAPID HIV SCREEN (HIV 1/2 AB+AG)  BASIC METABOLIC PANEL  I-STAT TROPONIN, ED   ____________________________________________  EKG   EKG Interpretation  Date/Time:  Saturday Jun 17 2017 14:18:57 EDT Ventricular Rate:  63 PR Interval:    QRS Duration: 187 QT Interval:  478 QTC Calculation: 490 R Axis:   -96 Text Interpretation:  Atrial flutter with varied AV block, Nonspecific IVCD with LAD Consider left ventricular hypertrophy Abnormal T, probable ischemia, lateral leads No previous ECGs available Confirmed by Wandra Arthurs [phone]) on 06/17/2017 2:21:39 PM Also confirmed by Wandra Arthurs [phone]), editor Philomena Doheny [phone])  on 06/17/2017 4:39:38 PM       ____________________________________________  RADIOLOGY  Ct Abdomen Pelvis Wo Contrast  Result Date: 06/17/2017 CLINICAL DATA:  Feeling lethargic.  Congestive heart failure. EXAM: CT ABDOMEN AND PELVIS WITHOUT CONTRAST TECHNIQUE: Multidetector CT imaging of the abdomen and pelvis was performed following the standard protocol without  IV contrast. COMPARISON:  None. FINDINGS: Lower chest: Enlarged heart. Calcific atherosclerotic disease of the coronary arteries. Mitral valve annular calcifications. Partially visualized cardiac pacemaker lead. Right lower lobe bronchiectasis and atelectatic changes. Loculated right pleural effusion Hepatobiliary: Right hepatic lobe cyst. Mild cholelithiasis. No biliary ductal dilation. Pancreas: Heterogeneous appearance of the head of the pancreas. No pancreatic ductal dilatation or surrounding inflammatory changes. Spleen: Normal in size without focal abnormality. Adrenals/Urinary Tract: Adrenal glands are unremarkable. Kidneys are normal, without renal calculi, focal lesion, or hydronephrosis. Bladder is unremarkable. Stomach/Bowel: The stomach is decompressed and grossly unremarkable. There is an irregular mucosal thickening of the second and third portions of the duodenum. Normal appearance of the remainder of the small bowel and colon. Vascular/Lymphatic: Aortic atherosclerosis. No enlarged abdominal or pelvic lymph nodes. Reproductive: Mild enlargement of the prostate gland. Other: Small periumbilical  fat containing anterior abdominal wall hernia. Musculoskeletal: Lumbosacral spine spondylosis. IMPRESSION: Right lower lobe atelectasis and bronchiectasis with loculated right pleural effusion. Mild cholelithiasis. Heterogeneous appearance of the head of the pancreas. Correlation with serum pancreatic enzymes is recommended. Irregular mucosal thickening of the second and third portions of the duodenum. This may represent segmental duodenitis, however malignancy cannot be excluded. GI consult may be considered. Electronically Signed   By: Fidela Salisbury M.D.   On: 06/17/2017 18:41   Dg Chest 2 View  Result Date: 06/17/2017 CLINICAL DATA:  Chest pain, shortness of breath. EXAM: CHEST - 2 VIEW COMPARISON:  None. FINDINGS: The heart size and mediastinal contours are within normal limits. Status post aortic  valve replacement. Left-sided pacemaker is unchanged in position. Left lung is clear. Small right pleural effusion is noted with associated atelectasis or scarring. The visualized skeletal structures are unremarkable. IMPRESSION: Small right pleural effusion is noted with associated subsegmental atelectasis or scarring. Electronically Signed   By: Marijo Conception, M.D.   On: 06/17/2017 14:54    ____________________________________________   PROCEDURES  Procedure(s) performed:   .Critical Care Performed by: Margette Fast, MD Authorized by: Margette Fast, MD   Critical care provider statement:    Critical care time (minutes):  35   Critical care time was exclusive of:  Separately billable procedures and treating other patients and teaching time   Critical care was necessary to treat or prevent imminent or life-threatening deterioration of the following conditions:  Metabolic crisis   Critical care was time spent personally by me on the following activities:  Blood draw for specimens, development of treatment plan with patient or surrogate, discussions with consultants, evaluation of patient's response to treatment, examination of patient, obtaining history from patient or surrogate, ordering and performing treatments and interventions, ordering and review of laboratory studies, ordering and review of radiographic studies, pulse oximetry, re-evaluation of patient's condition and review of old charts   I assumed direction of critical care for this patient from another provider in my specialty: no     ____________________________________________   INITIAL IMPRESSION / [place] / ED COURSE  Pertinent labs & imaging results that were available during my care of the patient were reviewed by me and considered in my medical decision making (see chart for details).  Patient presents to the emergency department for evaluation of generalized fatigue with abdominal discomfort/cramping.   Is describing some dyspnea which is chronic.  Blood sugar has been reading "high" at SNF.  Afebrile here with largely unremarkable vital signs. CXR and CT abdomen/pelvis ordered prior to my evaluation.   Patient with worsening creatinine and hyperkalemia. No DKA but will start IVF and insulin drip at this time for tight glucose control given symptoms. CT imaging reviewed which shows heterogenous area over the pancreas head and duodenal mucosal thickening. Spoke with Dr. Michail Sermon who will consult in the AM but doubts inpatient EGD. CXR and EKG reviewed with no acute findings.   Discussed patient's case with Internal Medicine teaching service to request admission. Patient and family (if present) updated with plan. Care transferred to medicine service.  I reviewed all nursing notes, vitals, pertinent old records, EKGs, labs, imaging (as available).  ____________________________________________  FINAL CLINICAL IMPRESSION(S) / ED DIAGNOSES  Final diagnoses:  Hyperglycemia  Epigastric pain    MEDICATIONS GIVEN DURING THIS VISIT:  Medications  iopamidol (ISOVUE-300) 61 % injection (has no administration in time range)  insulin regular (NOVOLIN R,HUMULIN R) 100 Units in sodium  chloride 0.9 % 100 mL (1 Units/mL) infusion (7.7 Units/hr Intravenous Rate/Dose Change 06/17/17 1920)  sodium chloride 0.9 % bolus 1,000 mL (0 mLs Intravenous Stopped 06/17/17 1543)  sodium chloride 0.9 % bolus 500 mL (0 mLs Intravenous Stopped 06/17/17 1825)    Note:  This document was prepared using Dragon voice recognition software and may include unintentional dictation errors.  Nanda Quinton, MD Emergency Medicine    Afreen Siebels, [facility], MD 06/17/17 2018

## 2017-06-17 NOTE — ED Triage Notes (Signed)
Patient arrived from [facility]). He reports feeling lethargic since last night. His Blood sugar was reading "high" according to house members-per EMS. EMS gave 171ml NS, CGB=353.  Patient appears sleeping but participated in full assessment

## 2017-06-17 NOTE — ED Provider Notes (Signed)
MSE was initiated and I personally evaluated the patient and placed orders (if any) at  2:30 PM on Jun 17, 2017.  The patient appears stable so that the remainder of the MSE may be completed by another provider.  Patient hx of DM, uncompliant with meds, here with weakness, fatigue. Patient states that has been more tired for the last week or so and has been urinating very frequently.  He states that he did not take his insulin regularly and his sugar has been running between 200-300s.  He also has some abdominal pain as well.  Patient was seen in the ED several weeks ago for muscle cramps and was noted to have a creatinine of 2.3 but has no baseline in our system. He appears chronically ill and dehydrated. I ordered CBC, CMP, magnesium, CXR, CT ab/pel. I ordered HIV as well as he appears cachetic    Drenda Freeze, MD 06/17/17 1435

## 2017-06-18 DIAGNOSIS — I4430 Unspecified atrioventricular block: Secondary | ICD-10-CM | POA: Diagnosis present

## 2017-06-18 DIAGNOSIS — E1165 Type 2 diabetes mellitus with hyperglycemia: Principal | ICD-10-CM

## 2017-06-18 DIAGNOSIS — I4892 Unspecified atrial flutter: Secondary | ICD-10-CM | POA: Diagnosis present

## 2017-06-18 DIAGNOSIS — Z79899 Other long term (current) drug therapy: Secondary | ICD-10-CM

## 2017-06-18 DIAGNOSIS — I110 Hypertensive heart disease with heart failure: Secondary | ICD-10-CM | POA: Diagnosis present

## 2017-06-18 DIAGNOSIS — R1013 Epigastric pain: Secondary | ICD-10-CM | POA: Diagnosis not present

## 2017-06-18 DIAGNOSIS — I509 Heart failure, unspecified: Secondary | ICD-10-CM

## 2017-06-18 DIAGNOSIS — Z794 Long term (current) use of insulin: Secondary | ICD-10-CM | POA: Diagnosis not present

## 2017-06-18 DIAGNOSIS — J90 Pleural effusion, not elsewhere classified: Secondary | ICD-10-CM | POA: Diagnosis not present

## 2017-06-18 DIAGNOSIS — F17210 Nicotine dependence, cigarettes, uncomplicated: Secondary | ICD-10-CM

## 2017-06-18 DIAGNOSIS — J9811 Atelectasis: Secondary | ICD-10-CM | POA: Diagnosis present

## 2017-06-18 DIAGNOSIS — Z8249 Family history of ischemic heart disease and other diseases of the circulatory system: Secondary | ICD-10-CM | POA: Diagnosis not present

## 2017-06-18 DIAGNOSIS — B86 Scabies: Secondary | ICD-10-CM | POA: Diagnosis present

## 2017-06-18 DIAGNOSIS — G40909 Epilepsy, unspecified, not intractable, without status epilepticus: Secondary | ICD-10-CM | POA: Diagnosis present

## 2017-06-18 DIAGNOSIS — R935 Abnormal findings on diagnostic imaging of other abdominal regions, including retroperitoneum: Secondary | ICD-10-CM | POA: Diagnosis not present

## 2017-06-18 DIAGNOSIS — E860 Dehydration: Secondary | ICD-10-CM | POA: Diagnosis present

## 2017-06-18 DIAGNOSIS — Z590 Homelessness: Secondary | ICD-10-CM | POA: Diagnosis not present

## 2017-06-18 DIAGNOSIS — Z950 Presence of cardiac pacemaker: Secondary | ICD-10-CM | POA: Diagnosis not present

## 2017-06-18 DIAGNOSIS — R1084 Generalized abdominal pain: Secondary | ICD-10-CM

## 2017-06-18 DIAGNOSIS — L299 Pruritus, unspecified: Secondary | ICD-10-CM | POA: Diagnosis present

## 2017-06-18 DIAGNOSIS — Z952 Presence of prosthetic heart valve: Secondary | ICD-10-CM | POA: Diagnosis not present

## 2017-06-18 DIAGNOSIS — Z7901 Long term (current) use of anticoagulants: Secondary | ICD-10-CM | POA: Diagnosis not present

## 2017-06-18 DIAGNOSIS — Z833 Family history of diabetes mellitus: Secondary | ICD-10-CM | POA: Diagnosis not present

## 2017-06-18 LAB — BASIC METABOLIC PANEL
ANION GAP: 9 (ref 5–15)
Anion gap: 10 (ref 5–15)
BUN: 41 mg/dL — ABNORMAL HIGH (ref 6–20)
BUN: 40 mg/dL — AB (ref 6–20)
CHLORIDE: 102 mmol/L (ref 101–111)
CHLORIDE: 99 mmol/L — AB (ref 101–111)
CO2: 23 mmol/L (ref 22–32)
CO2: 22 mmol/L (ref 22–32)
Calcium: 9.2 mg/dL (ref 8.9–10.3)
Calcium: 8.9 mg/dL (ref 8.9–10.3)
Creatinine, Ser: 2.15 mg/dL — ABNORMAL HIGH (ref 0.61–1.24)
Creatinine, Ser: 2.11 mg/dL — ABNORMAL HIGH (ref 0.61–1.24)
GFR calc Af Amer: 36 mL/min — ABNORMAL LOW (ref >60)
GFR calc Af Amer: 37 mL/min — ABNORMAL LOW (ref >60)
GFR calc non Af Amer: 31 mL/min — ABNORMAL LOW (ref >60)
GFR calc non Af Amer: 32 mL/min — ABNORMAL LOW (ref >60)
GLUCOSE: 127 mg/dL — AB (ref 65–99)
Glucose, Bld: 303 mg/dL — ABNORMAL HIGH (ref 65–99)
POTASSIUM: 4.4 mmol/L (ref 3.5–5.1)
POTASSIUM: 5.1 mmol/L (ref 3.5–5.1)
SODIUM: 131 mmol/L — AB (ref 135–145)
Sodium: 134 mmol/L — ABNORMAL LOW (ref 135–145)

## 2017-06-18 LAB — GLUCOSE, CAPILLARY
GLUCOSE-CAPILLARY: 414 mg/dL — AB (ref 65–99)
GLUCOSE-CAPILLARY: 209 mg/dL — AB (ref 65–99)
Glucose-Capillary: 137 mg/dL — ABNORMAL HIGH (ref 65–99)

## 2017-06-18 LAB — CBC
HEMATOCRIT: 49 % (ref 39.0–52.0)
Hemoglobin: 16.2 g/dL (ref 13.0–17.0)
MCH: 26.2 pg (ref 26.0–34.0)
MCHC: 33.1 g/dL (ref 30.0–36.0)
MCV: 79.2 fL (ref 78.0–100.0)
Platelets: 179 10*3/uL (ref 150–400)
RBC: 6.19 MIL/uL — ABNORMAL HIGH (ref 4.22–5.81)
RDW: 15.8 % — ABNORMAL HIGH (ref 11.5–15.5)
WBC: 6.1 10*3/uL (ref 4.0–10.5)

## 2017-06-18 LAB — CBG MONITORING, ED
GLUCOSE-CAPILLARY: 139 mg/dL — AB (ref 65–99)
GLUCOSE-CAPILLARY: 169 mg/dL — AB (ref 65–99)
GLUCOSE-CAPILLARY: 294 mg/dL — AB (ref 65–99)
Glucose-Capillary: 87 mg/dL (ref 65–99)
Glucose-Capillary: 448 mg/dL — ABNORMAL HIGH (ref 65–99)

## 2017-06-18 MED ORDER — IVERMECTIN 3 MG PO TABS
200.0000 ug/kg | ORAL_TABLET | Freq: Once | ORAL | Status: AC
  Administered 2017-06-18: 15000 ug via ORAL
  Filled 2017-06-18 (×2): qty 5

## 2017-06-18 MED ORDER — INSULIN ASPART 100 UNIT/ML ~~LOC~~ SOLN: 0.0000 [IU] | Freq: Every day | SUBCUTANEOUS | Status: DC

## 2017-06-18 MED ORDER — INSULIN ASPART 100 UNIT/ML ~~LOC~~ SOLN
5.0000 [IU] | Freq: Once | SUBCUTANEOUS | Status: DC
  Filled 2017-06-18: qty 1

## 2017-06-18 MED ORDER — SODIUM CHLORIDE 0.9 % IV BOLUS
500.0000 mL | Freq: Once | INTRAVENOUS | Status: AC
  Administered 2017-06-18: 500 mL via INTRAVENOUS

## 2017-06-18 MED ORDER — INSULIN ASPART 100 UNIT/ML ~~LOC~~ SOLN: 0.0000 [IU] | Freq: Three times a day (TID) | SUBCUTANEOUS | Status: DC

## 2017-06-18 MED ORDER — INSULIN ASPART 100 UNIT/ML ~~LOC~~ SOLN
0.0000 [IU] | Freq: Three times a day (TID) | SUBCUTANEOUS | Status: DC
  Administered 2017-06-18: 15 [IU] via SUBCUTANEOUS

## 2017-06-18 MED ORDER — INSULIN ASPART 100 UNIT/ML ~~LOC~~ SOLN
3.0000 [IU] | Freq: Three times a day (TID) | SUBCUTANEOUS | Status: DC
  Administered 2017-06-18 – 2017-06-21 (×9): 3 [IU] via SUBCUTANEOUS
  Filled 2017-06-18: qty 1

## 2017-06-18 MED ORDER — SODIUM CHLORIDE 0.9 % IV SOLN
INTRAVENOUS | Status: AC
  Administered 2017-06-18: 13:00:00 via INTRAVENOUS

## 2017-06-18 MED ORDER — NICOTINE 21 MG/24HR TD PT24
21.0000 mg | MEDICATED_PATCH | Freq: Every day | TRANSDERMAL | Status: DC
  Administered 2017-06-18 – 2017-06-21 (×4): 21 mg via TRANSDERMAL
  Filled 2017-06-18 (×4): qty 1

## 2017-06-18 MED ORDER — INSULIN ASPART 100 UNIT/ML ~~LOC~~ SOLN
0.0000 [IU] | SUBCUTANEOUS | Status: DC
  Administered 2017-06-18: 2 [IU] via SUBCUTANEOUS
  Administered 2017-06-18: 15 [IU] via SUBCUTANEOUS
  Administered 2017-06-18: 2 [IU] via SUBCUTANEOUS
  Administered 2017-06-18: 5 [IU] via SUBCUTANEOUS
  Administered 2017-06-19: 2 [IU] via SUBCUTANEOUS

## 2017-06-18 NOTE — Progress Notes (Signed)
Patient arrived to 26m08, telebox in place, patient oriented to room, contact precaution initiated. See flow sheet for assessment.

## 2017-06-18 NOTE — ED Notes (Signed)
Pt ate entire breakfast tray.  ?

## 2017-06-18 NOTE — Progress Notes (Signed)
   Subjective: Kyle Newton was seen resting in his bed this morning stating that he was doing well. He was able to eat breakfast without any difficulty. He denied any nausea or vomiting.   Objective:  Vital signs in last 24 hours: Vitals:   06/18/17 0630 06/18/17 0715 06/18/17 0730 06/18/17 0832  BP: 106/83 (!) 145/87 (!) 141/93 115/86  Pulse: (!) 59 (!) 59 61 62  Resp: 16 17  16   Temp:    98.1 F (36.7 C)  TempSrc:    Oral  SpO2: 96% 97% 98% 100%  Weight:   163 lb 2.3 oz (74 kg)   Height:    5\' 9"  (1.753 m)   Physical Exam  Constitutional: He appears well-developed and well-nourished.  HENT:  Head: Normocephalic and atraumatic.  Eyes: Conjunctivae are normal.  Cardiovascular: Normal rate, regular rhythm and normal heart sounds.  Respiratory: Effort normal. No respiratory distress. He has no wheezes. He has rales (left lower lung field).  GI: Soft. Bowel sounds are normal. He exhibits no distension. There is no tenderness.  Musculoskeletal: He exhibits no edema.  Neurological: He is alert.  Skin: Rash (diffuse across body. Upper chest had areas of linear burrowing. Small 1cm raised bumps.) noted.  Psychiatric: He has a normal mood and affect. His behavior is normal. Judgment and thought content normal.   Assessment/Plan:  Ms. [place] is a 62 y.o male with CHF, pacemaker in place, diabetes mellitus, HTN who presented with 1 day of fatigue, abdominal pain, and hyperglycemia to 600s after running out of insulin.   HHS The patient states that he was diagnosed with diabetes mellitus in 1990s and has been on insulin and oral medications. He cannot recall what oral medication he was on in the past. The patient is on lantus 10uqhs and aspart 4u tid at home.   The patient ate breakfast this morning and denies nausea and vomiting. The patient's blood glucose this morning has increased 294-448. Changed from SSI tidwc to SSIiq4hrs.  -Lantus 10u qhs -SSI q4hrs -Aspart 3u tid -Normal saline  49ml/hr for 10hrs, will reassess need for fluids after this infusion  Hypertension  The patient's blood pressure over the past 24 hrs has been ranging 115-145/86-87.   -Holding home coreg 6.25mg  bid  Likely Scabies The patient has a several raised bumps throughout trunk and upper extremities and also several linear areas of burrowing suggestive of a scabies rash. Treating with ivermectin.  -Ivermectin 15,000 mcg  S/p Aortic valve replacement  -Continue xarelto  Seizure disorder -continue keppra 500mg  qd  Dispo: Anticipated discharge in approximately 1-2 day(s).   Lars Mage, MD 06/18/2017, 9:56 AM Pager: [phone]

## 2017-06-18 NOTE — Consult Note (Signed)
Referring Provider: Dr. Evette Doffing Primary Care Physician:  Patient, No Pcp Per Primary Gastroenterologist:  Althia Forts  Reason for Consultation:  Abdominal pain; Abnormal CT  HPI: Kyle Newton is a 62 y.o. male admitted for hyperglycemia, weakness, and abdominal pain and being treated for scabies with Ivermectin. A NON-contrast CT showed a heterogeneous appearance of the head of the pancreas and irregular mucosal thickening of the second and third portion of the duodenum. He reported periumbilical pain and 2 days of N/V to the admitting doctor but he denies any abd pain/N/V to me. He has been eating a large amount of solid food without any difficulty. Denies melena, hematochezia, hematemesis. Glucose 649. Hgb 17.2. ALP 195, Lipase 109, TB/AST/ALT within normal limits. On Xarelto for AVR. Feels fine.  Past Medical History:  Diagnosis Date  . CHF (congestive heart failure) ([place])   . Diabetes mellitus without complication ([place])   . Hypertension     Past Surgical History:  Procedure Laterality Date  . CARDIAC VALVE REPLACEMENT      Prior to Admission medications   Medication Sig Start Date End Date Taking? Authorizing Provider  atorvastatin (LIPITOR) 10 MG tablet Take 10 mg by mouth at bedtime.   Yes [provider]  carvedilol (COREG) 12.5 MG tablet Take 6.25 mg by mouth 2 (two) times daily with a meal.   Yes [provider]  diphenhydrAMINE (BENADRYL) 25 MG tablet Take 25 mg by mouth daily as needed for itching.   Yes [provider]  escitalopram (LEXAPRO) 10 MG tablet Take 10 mg by mouth daily.   Yes [provider]  furosemide (LASIX) 80 MG tablet Take 80 mg by mouth 2 (two) times daily.   Yes [provider]  hydrocortisone cream 1 % Apply 1 application topically at bedtime as needed for itching.   Yes [provider]  insulin aspart (NOVOLOG FLEXPEN) 100 UNIT/ML FlexPen Inject 4 Units into the skin 3 (three) times daily with meals.    Yes [provider]  Insulin Glargine (LANTUS SOLOSTAR) 100 UNIT/ML Solostar Pen Inject 10 Units into the skin at bedtime.   Yes [provider]  isosorbide-hydrALAZINE (BIDIL) 20-37.5 MG tablet Take 1 tablet by mouth 3 (three) times daily.   Yes [provider]  levETIRAcetam (KEPPRA) 500 MG tablet Take 500 mg by mouth daily.   Yes [provider]  Rivaroxaban (XARELTO) 15 MG TABS tablet Take 15 mg by mouth at bedtime.   Yes [provider]    Scheduled Meds: . escitalopram  10 mg Oral Daily  . insulin aspart  0-15 Units Subcutaneous Q4H  . insulin aspart  3 Units Subcutaneous TID WC  . insulin aspart  5 Units Subcutaneous Once  . insulin glargine  10 Units Subcutaneous QHS  . levETIRAcetam  500 mg Oral Daily  . nicotine  21 mg Transdermal Daily  . Rivaroxaban  15 mg Oral QHS   Continuous Infusions: . sodium chloride 75 mL/hr at 06/18/17 1255   PRN Meds:.acetaminophen **OR** acetaminophen, diphenhydrAMINE, hydrocortisone cream, senna-docusate  Allergies as of 06/17/2017  . (No Known Allergies)    Family History  Problem Relation Age of Onset  . Heart failure Mother   . Diabetes Mother   . Diabetes Father     Social History   Socioeconomic History  . Marital status: Widowed    Spouse name: Not on file  . Number of children: Not on file  . Years of education: Not on file  . Highest education  level: Not on file  Occupational History  . Not on file  Social Needs  . Financial resource strain: Not on file  . Food insecurity:    Worry: Not on file    Inability: Not on file  . Transportation needs:    Medical: Not on file    Non-medical: Not on file  Tobacco Use  . Smoking status: Current Every Day Smoker    Packs/day: 1.00    Years: 44.00    Pack years: 44.00    Types: Cigarettes  . Smokeless tobacco: Never Used  Substance and Sexual Activity  . Alcohol use: Not Currently  . Drug use: Not Currently    Types:  Marijuana, Cocaine  . Sexual activity: Not on file  Lifestyle  . Physical activity:    Days per week: Not on file    Minutes per session: Not on file  . Stress: Not on file  Relationships  . Social connections:    Talks on phone: Not on file    Gets together: Not on file    Attends religious service: Not on file    Active member of club or organization: Not on file    Attends meetings of clubs or organizations: Not on file    Relationship status: Not on file  . Intimate partner violence:    Fear of current or ex partner: Not on file    Emotionally abused: Not on file    Physically abused: Not on file    Forced sexual activity: Not on file  Other Topics Concern  . Not on file  Social History Narrative  . Not on file    Review of Systems: All negative except as stated above in HPI.  Physical Exam: Vital signs: Vitals:   06/18/17 0730 06/18/17 0832  BP: (!) 141/93 115/86  Pulse: 61 62  Resp:  16  Temp:  98.1 F (36.7 C)  SpO2: 98% 100%     General:   Lethargic, disheveled, thin, no acute distress Head: normocephalic, atraumatic Eyes: anicteric sclera ENT: oropharynx clear Neck: supple, nontender Lungs:  Clear throughout to auscultation.   No wheezes, crackles, or rhonchi. No acute distress. Heart:  Regular rate and rhythm; no murmurs, clicks, rubs,  or gallops. Abdomen: soft, nontender, nondistended, +BS  Rectal:  Deferred Ext: no edema Skin: excoriations on extremities and trunk  GI:  Lab Results: Recent Labs    06/17/17 1502 06/17/17 1521 06/18/17 0500  WBC 4.7  --  6.1  HGB 17.2* 19.4* 16.2  HCT 51.7 57.0* 49.0  PLT 193  --  179   BMET Recent Labs    06/17/17 2030 06/18/17 0050 06/18/17 0500  NA 127* 134* 131*  K 4.0 4.4 5.1  CL 95* 102 99*  CO2 22 23 22   GLUCOSE 344* 127* 303*  BUN 40* 40* 41*  CREATININE 2.04* 2.15* 2.11*  CALCIUM 9.1 9.2 8.9   LFT Recent Labs    06/17/17 1502  PROT 8.2*  ALBUMIN 3.2*  AST 34  ALT 21  ALKPHOS  195*  BILITOT 0.8   PT/INR No results for input(s): LABPROT, INR in the last 72 hours.   Studies/Results: Ct Abdomen Pelvis Wo Contrast  Result Date: 06/17/2017 CLINICAL DATA:  Feeling lethargic.  Congestive heart failure. EXAM: CT ABDOMEN AND PELVIS WITHOUT CONTRAST TECHNIQUE: Multidetector CT imaging of the abdomen and pelvis was performed following the standard protocol without IV contrast. COMPARISON:  None. FINDINGS: Lower chest: Enlarged heart. Calcific atherosclerotic disease of the  coronary arteries. Mitral valve annular calcifications. Partially visualized cardiac pacemaker lead. Right lower lobe bronchiectasis and atelectatic changes. Loculated right pleural effusion Hepatobiliary: Right hepatic lobe cyst. Mild cholelithiasis. No biliary ductal dilation. Pancreas: Heterogeneous appearance of the head of the pancreas. No pancreatic ductal dilatation or surrounding inflammatory changes. Spleen: Normal in size without focal abnormality. Adrenals/Urinary Tract: Adrenal glands are unremarkable. Kidneys are normal, without renal calculi, focal lesion, or hydronephrosis. Bladder is unremarkable. Stomach/Bowel: The stomach is decompressed and grossly unremarkable. There is an irregular mucosal thickening of the second and third portions of the duodenum. Normal appearance of the remainder of the small bowel and colon. Vascular/Lymphatic: Aortic atherosclerosis. No enlarged abdominal or pelvic lymph nodes. Reproductive: Mild enlargement of the prostate gland. Other: Small periumbilical fat containing anterior abdominal wall hernia. Musculoskeletal: Lumbosacral spine spondylosis. IMPRESSION: Right lower lobe atelectasis and bronchiectasis with loculated right pleural effusion. Mild cholelithiasis. Heterogeneous appearance of the head of the pancreas. Correlation with serum pancreatic enzymes is recommended. Irregular mucosal thickening of the second and third portions of the duodenum. This may represent  segmental duodenitis, however malignancy cannot be excluded. GI consult may be considered. Electronically Signed   By: Fidela Salisbury M.D.   On: 06/17/2017 18:41   Dg Chest 2 View  Result Date: 06/17/2017 CLINICAL DATA:  Chest pain, shortness of breath. EXAM: CHEST - 2 VIEW COMPARISON:  None. FINDINGS: The heart size and mediastinal contours are within normal limits. Status post aortic valve replacement. Left-sided pacemaker is unchanged in position. Left lung is clear. Small right pleural effusion is noted with associated atelectasis or scarring. The visualized skeletal structures are unremarkable. IMPRESSION: Small right pleural effusion is noted with associated subsegmental atelectasis or scarring. Electronically Signed   By: Marijo Conception, M.D.   On: 06/17/2017 14:54    Impression/Plan: 62 yo with hyperglycemia on treatment for scabies seen because of an abnormal CT scan of pancreas and duodenum. CT was non-contrast and no sign of duodenal obstruction with him tolerating POs without any issues. Has a pacemaker so cannot do MRCP to better evaluate pancreas. UGIS to evaluate duodenum closer and if abnormal may need an EGD. Will need a contrasted CT of pancreas when renal function will allow. NPO p MN for UGIS.    LOS: 0 days   [place] C.  06/18/2017, 1:10 PM  Questions please call [phone]

## 2017-06-18 NOTE — ED Notes (Signed)
Pt given sandwich and something to drink per request

## 2017-06-18 NOTE — ED Notes (Signed)
Checked CBG 169, RN Tray informed

## 2017-06-18 NOTE — ED Notes (Signed)
RN attempted to get blood x2

## 2017-06-18 NOTE — ED Notes (Signed)
Carb mod ordered

## 2017-06-18 NOTE — ED Notes (Signed)
Spoke with Admitting MD requested patient Bed be changed to Tele now that he is off insulin drip. Reports she will change the bed request.

## 2017-06-19 ENCOUNTER — Other Ambulatory Visit: Payer: Self-pay

## 2017-06-19 LAB — BASIC METABOLIC PANEL
Anion gap: 9 (ref 5–15)
BUN: 34 mg/dL — ABNORMAL HIGH (ref 6–20)
CHLORIDE: 103 mmol/L (ref 101–111)
CO2: 24 mmol/L (ref 22–32)
Calcium: 8.9 mg/dL (ref 8.9–10.3)
Creatinine, Ser: 1.76 mg/dL — ABNORMAL HIGH (ref 0.61–1.24)
GFR calc Af Amer: 46 mL/min — ABNORMAL LOW (ref >60)
GFR calc non Af Amer: 40 mL/min — ABNORMAL LOW (ref >60)
GLUCOSE: 89 mg/dL (ref 65–99)
POTASSIUM: 4.3 mmol/L (ref 3.5–5.1)
Sodium: 136 mmol/L (ref 135–145)

## 2017-06-19 LAB — HEMOGLOBIN A1C
Hgb A1c MFr Bld: 16.3 % — ABNORMAL HIGH (ref 4.8–5.6)
Mean Plasma Glucose: 421.11 mg/dL

## 2017-06-19 LAB — GLUCOSE, CAPILLARY
GLUCOSE-CAPILLARY: 79 mg/dL (ref 65–99)
GLUCOSE-CAPILLARY: 275 mg/dL — AB (ref 65–99)
Glucose-Capillary: 136 mg/dL — ABNORMAL HIGH (ref 65–99)
Glucose-Capillary: 165 mg/dL — ABNORMAL HIGH (ref 65–99)
Glucose-Capillary: 260 mg/dL — ABNORMAL HIGH (ref 65–99)
Glucose-Capillary: 344 mg/dL — ABNORMAL HIGH (ref 65–99)
Glucose-Capillary: 57 mg/dL — ABNORMAL LOW (ref 65–99)
Glucose-Capillary: 76 mg/dL (ref 65–99)

## 2017-06-19 MED ORDER — DEXTROSE 50 % IV SOLN
INTRAVENOUS | Status: AC
  Administered 2017-06-19: 50 mL
  Filled 2017-06-19: qty 50

## 2017-06-19 MED ORDER — INSULIN ASPART 100 UNIT/ML ~~LOC~~ SOLN
0.0000 [IU] | Freq: Three times a day (TID) | SUBCUTANEOUS | Status: DC
  Administered 2017-06-19: 8 [IU] via SUBCUTANEOUS
  Administered 2017-06-19: 11 [IU] via SUBCUTANEOUS

## 2017-06-19 MED ORDER — INSULIN GLARGINE 100 UNIT/ML ~~LOC~~ SOLN
6.0000 [IU] | Freq: Every day | SUBCUTANEOUS | Status: DC
  Administered 2017-06-19: 6 [IU] via SUBCUTANEOUS
  Filled 2017-06-19 (×2): qty 0.06

## 2017-06-19 MED ORDER — INSULIN ASPART 100 UNIT/ML ~~LOC~~ SOLN
0.0000 [IU] | SUBCUTANEOUS | Status: DC
  Administered 2017-06-19 – 2017-06-20 (×2): 5 [IU] via SUBCUTANEOUS
  Administered 2017-06-20: 1 [IU] via SUBCUTANEOUS
  Administered 2017-06-20: 9 [IU] via SUBCUTANEOUS

## 2017-06-19 MED ORDER — INSULIN ASPART 100 UNIT/ML ~~LOC~~ SOLN: 0.0000 [IU] | SUBCUTANEOUS | Status: DC

## 2017-06-19 NOTE — Discharge Summary (Signed)
Name: Kyle Newton MRN: [id number] DOB: Feb 13, 1955 62 y.o. PCP: Follows at [facility]  Date of Admission: 06/17/2017  2:01 PM Date of Discharge:  06/21/17 Attending Physician: Axel Filler, *  Discharge Diagnosis:  Hyperglycemic hyperosmotic syndrome: Scabies Abdominal pain  Discharge Medications: Allergies as of 06/21/2017   No Known Allergies     Medication List    TAKE these medications   atorvastatin 10 MG tablet Commonly known as:  LIPITOR Take 1 tablet (10 mg total) by mouth at bedtime.   carvedilol 12.5 MG tablet Commonly known as:  COREG Take 6.25 mg by mouth 2 (two) times daily with a meal.   citalopram 40 MG tablet Commonly known as:  CELEXA Take 20 mg by mouth daily.   diphenhydrAMINE 25 MG tablet Commonly known as:  BENADRYL Take 25 mg by mouth daily as needed for itching.   escitalopram 10 MG tablet Commonly known as:  LEXAPRO Take 10 mg by mouth daily.   furosemide 80 MG tablet Commonly known as:  LASIX Take 80 mg by mouth 2 (two) times daily.   hydrocortisone cream 1 % Apply 1 application topically at bedtime as needed for itching.   insulin aspart 100 UNIT/ML FlexPen Commonly known as:  NOVOLOG FLEXPEN Inject 4 Units into the skin 3 (three) times daily with meals.   Insulin Glargine 100 UNIT/ML Solostar Pen Commonly known as:  LANTUS SOLOSTAR Inject 20 Units into the skin at bedtime. What changed:  how much to take   isosorbide-hydrALAZINE 20-37.5 MG tablet Commonly known as:  BIDIL Take 1 tablet by mouth 3 (three) times daily.   levETIRAcetam 500 MG tablet Commonly known as:  KEPPRA Take 1 tablet (500 mg total) by mouth daily.   Rivaroxaban 15 MG Tabs tablet Commonly known as:  XARELTO Take 15 mg by mouth at bedtime.   traZODone 50 MG tablet Commonly known as:  DESYREL Take 50 mg by mouth at bedtime.       Disposition and follow-up:   [name] was discharged from [facility] in  stable condition.  At the hospital follow up visit please address:  1.  HHS: please ensure that the patient is taking Lantus 20u qhs and novolog 4u three times daily with meals. Please review the patient's home blood glucose measurements and ensure that he has not been having any hypoglycemic episodes.   Abdominal pain: Please ensure that the patient gets follow up endoscopy and CT abdomen.   Scabies: please ensure that the patient continues to not have any pruritis and that he is no longer contagious.  2.  Labs / imaging needed at time of follow-up: endoscopy and CT abdomen   3.  Pending labs/ test needing follow-up: none  Follow-up Appointments: Follow-up Information    Cl[facility].   Contact information: [place] [address] [phone]           Hospital Course by problem list:   Hyperglycemic hyperosmotic syndrome: The patient presented with abdominal discomfort, fatigue, and blood glucose in 600s. The patient's HbA1c=16.3 and he did not have anion gap or ketosis. The patient was treated for hyperglycemic hyperosmotic syndrome which was thought to be due to medication noncompliance. The patient was placed on an insulin drip, receiving IV fluids, and close cbg monitoring. The patient's blood glucose normalized. His insulin dose was adjusted to ensure that he is well controlled. He was discharged with lantus 20u qhs and novolog 4u three times  daily with meals and told to follow with his primary care physician at [facility].   Abdominal pain The patient presented with periumbilical abdominal pain, nausea, and fatigue for 1 day. His lipase was 109, normal liver enzymes, elevaed alp 195, t bilirubin of 0.8. CT abdomen showed a heterogenous appearance of head of pancreas and irregular mucosal thickening in the duodenum that prompted upper GI series to be performed. Upper GI series showed smoothly marginated well circumscribed area of  filling defect in second portion of duodenum. GI felt that the patient likely has benign polyps, but recommended endoscopy as an outpatient. He should also get repeat Ct imaging of abdomen ina few weeks.  Scabies The patient presented with diffuse pruritic hyperpigmented areas with excoriations and areas of linear erosions throughout his body which caused concern for scabies. There was not any significant area of scales. The patient was treated with two doses [place] [address]94 mcg ivermectin. The second dose of ivermectin was given earlier than the usual 7-14 day requirement as the patient is homeless and there was concern about whether the patient would get adequate therapy.  Discharge Vitals:   BP 111/83 (BP Location: Left Arm)   Pulse 65   Temp 98.1 F (36.7 C) (Oral)   Resp 18   Ht 5\' 9"  (1.753 m)   Wt 163 lb 2.3 oz (74 kg)   SpO2 98%   BMI 24.09 kg/m   Pertinent Labs, Studies, and Procedures:   CMP Latest Ref Rng & Units 06/20/2017 06/19/2017 06/18/2017  Glucose 65 - 99 mg/dL 168(H) 89 303(H)  BUN 6 - 20 mg/dL 27(H) 34(H) 41(H)  Creatinine 0.61 - 1.24 mg/dL 1.64(H) 1.76(H) 2.11(H)  Sodium 135 - 145 mmol/L 137 136 131(L)  Potassium 3.5 - 5.1 mmol/L 4.4 4.3 5.1  Chloride 101 - 111 mmol/L 110 103 99(L)  CO2 22 - 32 mmol/L 21(L) 24 22  Calcium 8.9 - 10.3 mg/dL 8.8(L) 8.9 8.9  Total Protein 6.5 - 8.1 g/dL - - -  Total Bilirubin 0.3 - 1.2 mg/dL - - -  Alkaline Phos 38 - 126 U/L - - -  AST 15 - 41 U/L - - -  ALT 17 - 63 U/L - - -   CBC Latest Ref Rng & Units 06/18/2017 06/17/2017 06/17/2017  WBC 4.0 - 10.5 K/uL 6.1 - 4.7  Hemoglobin 13.0 - 17.0 g/dL 16.2 19.4(H) 17.2(H)  Hematocrit 39.0 - 52.0 % 49.0 57.0(H) 51.7  Platelets 150 - 400 K/uL 179 - 193   CBG (last 3)  Recent Labs    06/20/17 2227 06/21/17 0740 06/21/17 1131  GLUCAP 387* 184* 225*    Upper GI series (06/20/17): Smoothly marginated and well circumscribed somewhat deformable filling defect in second portion of duodenum. Not  a lipoma, could be a benign polyp. Suggested endoscopic evaluation. Normal appearance of esophagus with small sliding hiatal hernia.   CT Abdomen (06/17/17): Heterogenous appearance of head of pancreas. Irregular mucosal thickening of second and third portions of duodenum. Represents segmental duodenitis, malignancy cannot be excluded.  Discharge Instructions: Discharge Instructions    (HEART FAILURE PATIENTS) Call MD:  Anytime you have any of the following symptoms: 1) 3 pound weight gain in 24 hours or 5 pounds in 1 week 2) shortness of breath, with or without a dry hacking cough 3) swelling in the hands, feet or stomach 4) if you have to sleep on extra pillows at night in order to breathe.   Complete by:  As directed    Call  MD for:  difficulty breathing, headache or visual disturbances   Complete by:  As directed    Call MD for:  extreme fatigue   Complete by:  As directed    Call MD for:  persistant dizziness or light-headedness   Complete by:  As directed    Diet - low sodium heart healthy   Complete by:  As directed    Discharge instructions   Complete by:  As directed    It was a pleasure to take care of you Mr. Jenifer. During your hospitalization you were taken care of for your high blood glucose readings. You were also treated for scabies. Please note that your Lantus dose has been increased from 10 units daily to 20 units daily. Please record your blood sugar levels before and after meals.   You should follow up with gastroenterology regarding your abdominal pain. Please also follow up with your primary care physician regarding your diabetes. Thank you!   Increase activity slowly   Complete by:  As directed       Signed: Lars Mage, MD 06/21/2017, 11:40 AM   Pager: [phone]

## 2017-06-19 NOTE — Progress Notes (Addendum)
   Subjective: Kyle Newton was seen resting in his bed this morning doing well. He denied abdominal pain, nausea, or vomiting.    Objective:  Vital signs in last 24 hours: Vitals:   06/18/17 0832 06/18/17 1851 06/18/17 2039 06/19/17 0418  BP: 115/86 101/75 120/85 134/90  Pulse: 62 61 61 62  Resp: 16 18 16 16   Temp: 98.1 F (36.7 C)  98 F (36.7 C) 97.7 F (36.5 C)  TempSrc: Oral     SpO2: 100% 100% 100% 100%  Weight:      Height: 5\' 9"  (1.753 m)      Physical Exam  Constitutional: He appears well-developed and well-nourished. No distress.  HENT:  Head: Normocephalic and atraumatic.  Eyes: Conjunctivae are normal.  Cardiovascular: Normal rate, regular rhythm and normal heart sounds.  Respiratory: Effort normal and breath sounds normal. No respiratory distress. He has no wheezes.  GI: Soft. Bowel sounds are normal.  Musculoskeletal: He exhibits no edema.  Neurological: He is alert.  Skin: Rash (throughout upper trunk with linear burrows) noted. He is not diaphoretic. No erythema.  Psychiatric: He has a normal mood and affect. His behavior is normal. Judgment and thought content normal.   Assessment/Plan:  Ms. Arther is a 62 y.o male with CHF, pacemaker in place, diabetes mellitus, HTN who presented with 1 day of fatigue, abdominal pain, and hyperglycemia to 600s after running out of insulin.   HHS The patient's blood glucose measurements over the past 24 hrs have been ranging 76-137. The patient was made npo in anticipation of upper GI series today 06/19/17.  -SSI q4hrs changed form moderate to sensitive -Lantus 10u qhs -HbA1c pending -Aspart 3u tid -Attempting to obtain records from [place]  Abdominal pain The patient presented with 2 day history of periumbilical pain along with nausea and vomiting. The patient's lipase level was 109 on admission with normal transaminases. Ct abdomen showed a heterogenous appearance of the head of the pancreas and irregular mucosal thickening  of the 2nd and 3rd portions of the duodenum.   GI evaluated patient and recommended UGIS to examine duodenum. Patient cannot get MRCP due to pacemaker.   -UGIS rescheduled for 5/28 -NPO at midnight 5/28, will adjust insulin to ssi sensitive and decrease lantus to 6u -Appreciate GI following  Hypertension  The patient's blood pressure over the past 24 hrs has been ranging 101-145/75-87  -continue holding home coreg 6.25mg  bid  Likely Scabies The patient has a several raised bumps throughout trunk and upper extremities and also several linear areas of burrowing suggestive of a scabies rash. Treated with ivermectin.  -Ivermectin 15,000 mcg given 5/26 -contact precautions  S/p Aortic valve replacement  -Continue xarelto  Seizure disorder -continue keppra 500mg  qd   Dispo: Anticipated discharge in approximately 1-2 day(s). [facility] but they mentioned to call back tomorrow 5/28 as it is federal holiday.   Lars Mage, MD 06/19/2017, 7:09 AM Pager: [phone]

## 2017-06-19 NOTE — Progress Notes (Signed)
Notification from radiology that patient will not have procedure today. Page to Dr. Ronalee Red for notification with call back. Patient to resume diet and return to NPO p MN tonight. Dorthey Sawyer, RN

## 2017-06-20 ENCOUNTER — Inpatient Hospital Stay (HOSPITAL_COMMUNITY): Payer: Medicare Other

## 2017-06-20 LAB — GLUCOSE, CAPILLARY
GLUCOSE-CAPILLARY: 191 mg/dL — AB (ref 65–99)
GLUCOSE-CAPILLARY: 124 mg/dL — AB (ref 65–99)
GLUCOSE-CAPILLARY: 283 mg/dL — AB (ref 65–99)
GLUCOSE-CAPILLARY: 387 mg/dL — AB (ref 65–99)
Glucose-Capillary: 114 mg/dL — ABNORMAL HIGH (ref 65–99)
Glucose-Capillary: 259 mg/dL — ABNORMAL HIGH (ref 65–99)
Glucose-Capillary: 479 mg/dL — ABNORMAL HIGH (ref 65–99)

## 2017-06-20 LAB — BASIC METABOLIC PANEL
Anion gap: 6 (ref 5–15)
BUN: 27 mg/dL — ABNORMAL HIGH (ref 6–20)
CALCIUM: 8.8 mg/dL — AB (ref 8.9–10.3)
CHLORIDE: 110 mmol/L (ref 101–111)
CO2: 21 mmol/L — AB (ref 22–32)
CREATININE: 1.64 mg/dL — AB (ref 0.61–1.24)
GFR calc non Af Amer: 43 mL/min — ABNORMAL LOW (ref >60)
GFR, EST AFRICAN AMERICAN: 50 mL/min — AB (ref >60)
GLUCOSE: 168 mg/dL — AB (ref 65–99)
Potassium: 4.4 mmol/L (ref 3.5–5.1)
Sodium: 137 mmol/L (ref 135–145)

## 2017-06-20 MED ORDER — INSULIN GLARGINE 100 UNIT/ML ~~LOC~~ SOLN
10.0000 [IU] | Freq: Every day | SUBCUTANEOUS | Status: DC
  Administered 2017-06-20: 10 [IU] via SUBCUTANEOUS
  Filled 2017-06-20 (×2): qty 0.1

## 2017-06-20 MED ORDER — INSULIN ASPART 100 UNIT/ML ~~LOC~~ SOLN: 0.0000 [IU] | Freq: Every day | SUBCUTANEOUS | Status: DC

## 2017-06-20 MED ORDER — INSULIN ASPART 100 UNIT/ML ~~LOC~~ SOLN: 0.0000 [IU] | Freq: Three times a day (TID) | SUBCUTANEOUS | Status: DC

## 2017-06-20 NOTE — Progress Notes (Signed)
Inpatient Diabetes Program Recommendations  AACE/ADA: New Consensus Statement on Inpatient Glycemic Control (2015)  Target Ranges:  Prepandial:   less than 140 mg/dL      Peak postprandial:   less than 180 mg/dL (1-2 hours)      Critically ill patients:  140 - 180 mg/dL   Spoke with patient about diabetes and home regimen for diabetes control. Patient reports that he is able to get his medications and supplies. Patient reports not remembering  To take his medication. When he would check his glucose it was very high. Patient reports having a smart phone but not knowing how to work it.  This Coordinator went to patient's room and programed his smart phone with alarms for 8am meds and insulin, 12 insulin, 6 insulin, 9 meds and insulin to help with compliance.   Spoke with patient about A1c levels this admission 16.3%, and glucose and A1c goals. Patient says he checks his glucose frequently every day. Discussed importance of checking CBGs and maintaining good CBG control to prevent long-term and short-term complications.  Patient verbalized understanding of information discussed and he states that he has no further questions at this time related to diabetes.   Thanks,  Tama Headings RN, MSN, [facility] Inpatient Diabetes Coordinator Team Pager [phone] (8a-5p)

## 2017-06-20 NOTE — Progress Notes (Signed)
   Subjective: [name] was seen resting in his bed this morning doing well. He states that his itching has decreased.   Objective:  Vital signs in last 24 hours: Vitals:   06/19/17 0849 06/19/17 1651 06/19/17 2026 06/20/17 0444  BP: (!) 120/94 (!) 117/98 119/76 96/72  Pulse: 62 64 61 61  Resp: 16 18 18 16   Temp: (!) 97.5 F (36.4 C) 98 F (36.7 C) 98.5 F (36.9 C) 98.5 F (36.9 C)  TempSrc: Oral Oral Oral   SpO2: 100% 100% 100% 99%  Weight:   163 lb (73.9 kg)   Height:       Physical Exam  Constitutional: He appears well-nourished. No distress.  HENT:  Head: Normocephalic and atraumatic.  Eyes: Conjunctivae are normal.  Cardiovascular: Normal rate, regular rhythm and normal heart sounds.  Respiratory: Effort normal and breath sounds normal. No respiratory distress. He has no wheezes.  GI: Soft. Bowel sounds are normal. He exhibits no distension. There is no tenderness.  Musculoskeletal: He exhibits no edema.  Neurological: He is alert.  Skin: Rash (linear areas of hyperpigmentation with burrowing of skin) noted. He is not diaphoretic.  Psychiatric: He has a normal mood and affect. His behavior is normal. Judgment and thought content normal.   Assessment/Plan:  Kyle Newton is a 62 y.o male with CHF, pacemaker in place, diabetes mellitus, HTN who presented with 1 day of fatigue, abdominal pain, and hyperglycemia to 600s after running out of insulin.   HHS The patient's blood glucose measurements over the past 24 hrs have been ranging 124-259. His A1c=16.3. Will educate and counsel patient on importance of medication compliance.   -SSI q4hrs changed form moderate to sensitive -Lantus 10u qhs -Aspart 3u tid  Abdominal pain Ct abdomen showed a heterogenous appearance of the head of the pancreas and irregular mucosal thickening of the 2nd and 3rd portions of the duodenum. Lipase was 109. GI following patient and recommended UGIS. The patient's renal function continues to  improve-cr=1.64 today 5/28.  -UGIS to be done today -Appreciate GI following -faxed records request from [place]  Hypertension  The patient's blood pressure over the past 24 hrs has been ranging 96-120/72-94.  -continue holding home coreg 6.25mg  bid due to blood pressure being low  Scabies The patient has a several raised bumps throughout trunk and upper extremities and also several linear areas of burrowing suggestive of a scabies rash. Treated with ivermectin.  -Ivermectin 15,000 mcg given 5/26 -contact precautions  S/p Aortic valve replacement  -Continue xarelto  Seizure disorder -continue keppra 500mg  qd  Dispo: Anticipated discharge in approximately 1-2 day(s).   Lars Mage, MD 06/20/2017, 11:15 AM Pager: [phone]

## 2017-06-20 NOTE — Care Management Note (Addendum)
Case Management Note  Patient Details  Name: Kyle Newton MRN: [id number] Date of Birth: 02-01-1955  Subjective/Objective: History of CHF, pacemaker in place, diabetes mellitus, HTN; admitted for Hyperglycemia (A1c=16.3) and abdominal pain.  No PCP noted, but patient states he goes to [facility]; PCP is Dr. Allene Dillon, fax [phone].  Action/Plan: Inpatient Diabetes Program-consulted.  GI consulted-Ct abdomen showed a heterogenous appearance of the head of the pancreas and irregular mucosal thickening of the 2nd and 3rd portions of the duodenum.  UGIS to be done today 06/20/17.  NCM will continue to follow for discharge transition needs.  Expected Discharge Date:    06/23/2017              Expected Discharge Plan:  Home/Self Care  In-House Referral:    Inpatient Diabetes Program   Discharge planning Services  CM Consult  Status of Service:  In process, will continue to follow  Kristen Cardinal, RN 06/20/2017, 2:47 PM

## 2017-06-20 NOTE — Plan of Care (Signed)
UGI series reviewed.  Region in small bowel appears benign polyp, does not need urgent endoscopy.  No evidence of bowel obstruction.  Would advise treatment of scabies and advance diet as tolerated.  Will need repeat CT with contrast in the next several weeks, and can consider endoscopy at that time; there is no urgency to this, and would be better from public health perspective to have the scabies eradicated before bring patient down to endoscopy unit for a non-urgent procedure, for fear of exposing other patients and staff.  Patient can have outpatient follow-up arranged with Dr. Michail Sermon or myself once patient is no longer felt to be contagious from his scabies, and we can make these outpatient CT +/- endoscopy arrangements.  If something more urgent comes up, please contact us in the interim.  Thanks for the consultation.   [facility] will sign off.

## 2017-06-20 NOTE — Progress Notes (Signed)
UGI series pending.  So long as no obvious obstruction or significant findings on UGI series, would advise completion of treatment of scabies then repeat his CT once he can receive contrast; if the follow-up CT with contrast shows persistent findings, then would consider endoscopy.  [facility] will follow at a distance.

## 2017-06-21 DIAGNOSIS — B86 Scabies: Secondary | ICD-10-CM

## 2017-06-21 LAB — GLUCOSE, CAPILLARY
GLUCOSE-CAPILLARY: 184 mg/dL — AB (ref 65–99)
Glucose-Capillary: 225 mg/dL — ABNORMAL HIGH (ref 65–99)

## 2017-06-21 MED ORDER — ATORVASTATIN CALCIUM 10 MG PO TABS: 10.0000 mg | ORAL_TABLET | Freq: Every day | ORAL | 0 refills | Status: DC

## 2017-06-21 MED ORDER — IVERMECTIN 3 MG PO TABS
200.0000 ug/kg | ORAL_TABLET | Freq: Once | ORAL | Status: AC
  Administered 2017-06-21: 15000 ug via ORAL
  Filled 2017-06-21: qty 5

## 2017-06-21 MED ORDER — INSULIN ASPART 100 UNIT/ML FLEXPEN: 4.0000 [IU] | PEN_INJECTOR | Freq: Three times a day (TID) | SUBCUTANEOUS | 1 refills | Status: DC

## 2017-06-21 MED ORDER — ISOSORB DINITRATE-HYDRALAZINE 20-37.5 MG PO TABS: 1.0000 | ORAL_TABLET | Freq: Three times a day (TID) | ORAL | 0 refills | Status: DC

## 2017-06-21 MED ORDER — INSULIN ASPART 100 UNIT/ML ~~LOC~~ SOLN: 0.0000 [IU] | Freq: Every day | SUBCUTANEOUS | Status: DC

## 2017-06-21 MED ORDER — INSULIN GLARGINE 100 UNIT/ML SOLOSTAR PEN: 20.0000 [IU] | PEN_INJECTOR | Freq: Every day | SUBCUTANEOUS | 1 refills | Status: DC

## 2017-06-21 MED ORDER — INSULIN ASPART 100 UNIT/ML ~~LOC~~ SOLN
0.0000 [IU] | Freq: Three times a day (TID) | SUBCUTANEOUS | Status: DC
  Administered 2017-06-21: 3 [IU] via SUBCUTANEOUS
  Administered 2017-06-21: 5 [IU] via SUBCUTANEOUS

## 2017-06-21 MED ORDER — LEVETIRACETAM 500 MG PO TABS: 500.0000 mg | ORAL_TABLET | Freq: Every day | ORAL | 0 refills | Status: DC

## 2017-06-21 NOTE — Progress Notes (Signed)
   Subjective:  Kyle Newton was seen resting in his bed this morning. He stated that he is doing well and he denies nausea, vomiting, or abdominal pain.   Objective:  Vital signs in last 24 hours: Vitals:   06/20/17 1636 06/20/17 2042 06/21/17 0326 06/21/17 0741  BP: (!) 110/52 111/80 (!) 128/94 111/83  Pulse: 73 67 70 65  Resp: 20 16 18 18   Temp: 99 F (37.2 C) 98.5 F (36.9 C) 98.4 F (36.9 C) 98.1 F (36.7 C)  TempSrc: Oral Oral Oral Oral  SpO2: 98% 98% 99% 98%  Weight:  163 lb 2.3 oz (74 kg)    Height:       Physical Exam  Constitutional: He appears well-developed and well-nourished. No distress.  HENT:  Head: Normocephalic and atraumatic.  Eyes: Conjunctivae are normal.  Cardiovascular: Normal rate, regular rhythm and normal heart sounds.  Respiratory: Effort normal and breath sounds normal. No respiratory distress. He has no wheezes.  GI: Soft. Bowel sounds are normal. He exhibits no distension. There is no tenderness.  Musculoskeletal: He exhibits no edema.  Neurological: He is alert.  Skin: Rash (hyperpigmented areas throughout body with areas of linear erosion and burrowing) noted. He is not diaphoretic. No erythema.  Psychiatric: He has a normal mood and affect. His behavior is normal. Judgment and thought content normal.   Assessment/Plan:  Kyle Newton is a 62 y.o male with CHF, pacemaker in place, diabetes mellitus, HTN who presented with 1 day of fatigue, abdominal pain, and hyperglycemia to 600s after running out of insulin.   HHS The patient's blood glucose measurements over the past 24 hrs have not been controlled and ranging 300-400s. The patient has been eating 100% of his meals. He received 21u of aspart and 10u glargine yesterday.   -Increased ssi from sensitive to moderate  -Will discharge patient with lantus 20u qhs and novolog 4u during meals.  Abdominal pain Upper GI series showed smoothly marginated and well circumscribed deformable filling defect  in the second portion of the duodenum that is likely indicative of a benign polyp. GI recommended endoscopy outpatient and repeat CT in a few weeks.   Hypertension  The patient's blood pressure over the past 24 hrs has been ranging 110-128/52-94  -Will resume coreg 6.25mg  bid on discharge  Scabies The patient has a several raised bumps throughout trunk and upper extremities and also several linear areas of burrowing suggestive of a scabies rash.   -Ivermectin 15,000 mcggiven 5/26. The patient will get another dose of ivermectin 15055mcg today earlier than the usual due to the patient being homeless and concern for him not getting his medication. -contact precautions  S/p Aortic valve replacement  -Continue xarelto, have not received cardiology records from [place] yet. [facility].  Seizure disorder -continue keppra 500mg  qd  Dispo: Anticipated discharge today.   Lars Mage, MD 06/21/2017, 10:41 AM Pager: [phone]

## 2017-06-21 NOTE — Progress Notes (Signed)
Discharge Summary faxed to Patient primary care provider Dr. Allene Dillon.  NCM received successful confirmation.  Montel Culver, RN Nurse Case Manager

## 2017-06-21 NOTE — Progress Notes (Signed)
Inpatient Diabetes Program Recommendations  AACE/ADA: New Consensus Statement on Inpatient Glycemic Control (2015)  Target Ranges:  Prepandial:   less than 140 mg/dL      Peak postprandial:   less than 180 mg/dL (1-2 hours)      Critically ill patients:  140 - 180 mg/dL   Results for TENG, DECOU (MRN [id number]) as of 06/21/2017 08:03  Ref. Range 06/20/2017 08:19 06/20/2017 12:03 06/20/2017 16:14 06/20/2017 20:42 06/20/2017 22:27 06/21/2017 07:40  Glucose-Capillary Latest Ref Range: 65 - 99 mg/dL 124 (H) 114 (H) 283 (H) 479 (H) 387 (H) 184 (H)   Review of Glycemic Control  Diabetes history: DM 2 Outpatient Diabetes medications: Lantus 10 units qhs, Novolog 4 units tid with meals Current orders for Inpatient glycemic control: Lantus 10 units qhs, Novolog Moderate Correction 0-15 units tid + Novolog HS scale 0-5 units + Novolog 3 units tid meal coverage  Inpatient Diabetes Program Recommendations:    Patient's glucose increased after meals into the 400 range. Consider increasing Novolog meal coverage to 5 units tid if patient consumes at least 50% of meals.  Thanks,  Tama Headings RN, MSN, BC-ADM, [facility] Inpatient Diabetes Coordinator Team Pager [phone] (8a-5p)

## 2017-06-21 NOTE — Progress Notes (Signed)
Patient discharged to [facility] house. Patient AVS reviewed and signed. Patient capable re-verbalizing medications and follow-up appointments. IV removed. Patient belongings sent with patient. Patient educated to return to the ED in the event of SOB, chest pain or dizziness.   De Nurse, RN

## 2017-06-21 NOTE — Discharge Instructions (Signed)
Hyperglycemic Hyperosmolar State Hyperglycemic hyperosmolar state is a serious condition in which you experience an extreme increase in your blood sugar (glucose) level. This makes your body become extremely dehydrated, which can be life-threatening. This condition is a result of uncontrolled or undiagnosed diabetes. It occurs most often in people who have type 2 diabetes (type 2 diabetes mellitus). Certain hormones (insulin and glucagon) control the level of glucose that is in the blood. Insulin lowers blood glucose, and glucagon increases blood glucose. Hyperglycemia can result from having too little insulin in the bloodstream, or from the body not responding normally to insulin. Normally, the body gets rid of excess glucose through urine. If you do not drink enough fluids, or if you drink fluids that contain sugar, your body cannot get rid of excess glucose. This can result in hyperglycemic hyperosmolar state. What are the causes? This condition may be caused by:  Infection.  Medicines that cause you to become dehydrated or cause you to lose fluid.  Certain illnesses.  Not taking your diabetes medicine.  New onset or diagnosis of diabetes.  Cardiovascular disease (CVD).  What increases the risk? The following factors make you more likely to develop this condition:  Older age.  Poor management of diabetes.  Inability to eat or drink normally.  Heart failure.  Infection.  Surgery.  Illness.  What are the signs or symptoms? Symptoms of this condition include:  Extreme or increased thirst. This symptom may gradually disappear.  Needing to urinate more often than usual.  Dry mouth.  Warm, dry skin that does not sweat even in high temperatures.  High fever.  Sleepiness or confusion.  Vision problems or vision loss.  Seeing, hearing, tasting, smelling, or feeling things that are not real (hallucinations).  Weakness.  Weight loss.  Vomiting.  How is this  diagnosed? Hyperglycemic hyperosmolar state is diagnosed based on your medical history, your symptoms, and a blood test to measure your blood glucose level. How is this treated? This condition is treated in the hospital. The goals of treatment are:  To correct dehydration by replacing fluids that you have lost. Fluids will be given through an IV tube.  To improve blood sugar levels using insulin or other medicines as needed.  To treat the cause of hyperglycemia, such as an infection, illness, or newly diagnosed diabetes.  Follow these instructions at home: General instructions  Take over-the-counter and prescription medicines only as told by your health care provider.  Do not use any products that contain nicotine or tobacco, such as cigarettes and e-cigarettes. If you need help quitting, ask your health care provider.  Limit alcohol intake to no more than 1 drink a day for nonpregnant women and 2 drinks a day for men. One drink equals 12 oz of beer, 5 oz of wine, or 1 oz of hard liquor.  Stay hydrated, especially when you exercise, when you get sick, or when you spend time in hot temperatures.  Learn to manage stress. If you need help with this, ask your health care provider.  Keep all follow-up visits as told by your health care provider. This is important. Eating and drinking  Maintain a healthy weight.  Exercise regularly, as directed by your health care provider.  Eat healthy foods, such as: ? Lean proteins. ? Complex carbohydrates. ? Fresh fruits and vegetables. ? Low-fat dairy products. ? Healthy fats.  Drink enough fluid to keep your urine clear or pale yellow. If You Have Diabetes:   Make sure you know  the early signs and symptoms of hyperglycemia.  Follow your diabetes management plan, as told by your health care provider. Make sure you: ? Take your insulin and medicines as directed. ? Follow your exercise plan. ? Follow your meal plan. Eat on time, and do  not skip meals. ? Check your blood glucose as often as directed. Make sure to check your blood glucose before and after exercise. If you exercise longer or in a different way than usual, check your blood glucose more often. ? Follow your sick day plan whenever you cannot eat or drink normally. Make this plan in advance with your health care provider.  Share your diabetes management plan with people in your workplace, school, and household.  Check your urine for ketones when you are ill and as often as told by your health care provider.  Carry a medical alert card or wear medical alert jewelry. Contact a health care provider if:  You cannot eat or drink without throwing up.  You develop a fever. Get help right away if:  You develop symptoms of hyperglycemic hyperosmolar state. These symptoms may represent a serious problem that is an emergency. Do not wait to see if the symptoms will go away. Get medical help right away. Call your local emergency services (911 in the [place] Do not drive yourself to the hospital. Summary  Hyperglycemic hyperosmolar state is a serious condition in which you experience an extreme increase in your blood sugar (glucose) level. This makes your body become extremely dehydrated, which can be life-threatening.  This condition is a result of uncontrolled or undiagnosed diabetes. It occurs most often in people who have type 2 diabetes (type 2 diabetes mellitus).  This condition is treated in the hospital. Treatment may include fluids given through an IV tube and other medicines.  Make sure you know the early signs and symptoms of hyperglycemia.  Follow your diabetes management plan, as told by your health care provider. This information is not intended to replace advice given to you by your health care provider. Make sure you discuss any questions you have with your health care provider. Document Released: 11/13/2003 Document Revised: 01/04/2016 Document Reviewed:  01/04/2016 Elsevier Interactive Patient Education  2018 [place], Adult Scabies is a skin condition that happens when very small insects get under the skin (infestation). This causes a rash and severe itchiness. Scabies can spread from person to person (is contagious). If you get scabies, it is common for others in your household to get scabies too. With proper treatment, symptoms usually go away in 2-4 weeks. Scabies usually does not cause lasting problems. What are the causes? This condition is caused by mites (Sarcoptes scabiei, or human itch mites) that can only be seen with a microscope. The mites get into the top layer of skin and lay eggs. Scabies can spread from person to person through:  Close contact with a person who has scabies.  Contact with infested items, such as towels, bedding, or clothing.  What increases the risk? This condition is more likely to develop in:  People who live in nursing homes and other extended-care facilities.  People who have sexual contact with a partner who has scabies.  Young children who attend child care facilities.  People who care for others who are at increased risk for scabies.  What are the signs or symptoms? Symptoms of this condition may include:  Severe itchiness. This is often worse at night.  A rash that includes tiny  red bumps or blisters. The rash commonly occurs on the wrist, elbow, armpit, fingers, waist, groin, or buttocks. Bumps may form a line (burrow) in some areas.  Skin irritation. This can include scaly patches or sores.  How is this diagnosed? This condition is diagnosed with a physical exam. Your health care provider will look closely at your skin. In some cases, your health care provider may take a sample of your affected skin (skin scraping) and have it examined under a microscope. How is this treated? This condition may be treated with:  Medicated cream or lotion that kills the mites. This is  spread on the entire body and left on for several hours. Usually, one treatment with medicated cream or lotion is enough to kill all of the mites. In severe cases, the treatment may be repeated.  Medicated cream that relieves itching.  Medicines that help to relieve itching.  Medicines that kill the mites. This treatment is rarely used.  Follow these instructions at home:  Medicines  Take or apply over-the-counter and prescription medicines as told by your health care provider.  Apply medicated cream or lotion as told by your health care provider.  Do not wash off the medicated cream or lotion until the necessary amount of time has passed. Skin Care  Avoid scratching your affected skin.  Keep your fingernails closely trimmed to reduce injury from scratching.  Take cool baths or apply cool washcloths to help reduce itching. General instructions  Clean all items that you recently had contact with, including bedding, clothing, and furniture. Do this on the same day that your treatment starts. ? Use hot water when you wash items. ? Place unwashable items into closed, airtight plastic bags for at least 3 days. The mites cannot live for more than 3 days away from human skin. ? Vacuum furniture and mattresses that you use.  Make sure that other people who may have been infested are examined by a health care provider. These include members of your household and anyone who may have had contact with infested items.  Keep all follow-up visits as told by your health care provider. This is important. Contact a health care provider if:  You have itching that does not go away after 4 weeks of treatment.  You continue to develop new bumps or burrows.  You have redness, swelling, or pain in your rash area after treatment.  You have fluid, blood, or pus coming from your rash. This information is not intended to replace advice given to you by your health care provider. Make sure you discuss  any questions you have with your health care provider. Document Released: 10/01/2014 Document Revised: 06/18/2015 Document Reviewed: 08/12/2014 Elsevier Interactive Patient Education  [employer].

## 2017-06-23 ENCOUNTER — Observation Stay (HOSPITAL_COMMUNITY)
Admission: EM | Admit: 2017-06-23 | Discharge: 2017-06-25 | Disposition: A | Discharge status: Home / Self Care | Payer: Medicare Other | Attending: Internal Medicine | Admitting: Internal Medicine

## 2017-06-23 ENCOUNTER — Encounter (HOSPITAL_COMMUNITY): Payer: Self-pay | Admitting: Emergency Medicine

## 2017-06-23 ENCOUNTER — Emergency Department (HOSPITAL_COMMUNITY): Payer: Medicare Other

## 2017-06-23 ENCOUNTER — Other Ambulatory Visit: Payer: Self-pay

## 2017-06-23 DIAGNOSIS — Z79899 Other long term (current) drug therapy: Secondary | ICD-10-CM | POA: Diagnosis not present

## 2017-06-23 DIAGNOSIS — I959 Hypotension, unspecified: Principal | ICD-10-CM | POA: Insufficient documentation

## 2017-06-23 DIAGNOSIS — I110 Hypertensive heart disease with heart failure: Secondary | ICD-10-CM | POA: Diagnosis not present

## 2017-06-23 DIAGNOSIS — E1165 Type 2 diabetes mellitus with hyperglycemia: Secondary | ICD-10-CM | POA: Diagnosis not present

## 2017-06-23 DIAGNOSIS — F17210 Nicotine dependence, cigarettes, uncomplicated: Secondary | ICD-10-CM | POA: Insufficient documentation

## 2017-06-23 DIAGNOSIS — Z7901 Long term (current) use of anticoagulants: Secondary | ICD-10-CM | POA: Insufficient documentation

## 2017-06-23 DIAGNOSIS — I509 Heart failure, unspecified: Secondary | ICD-10-CM | POA: Insufficient documentation

## 2017-06-23 DIAGNOSIS — E875 Hyperkalemia: Secondary | ICD-10-CM | POA: Diagnosis not present

## 2017-06-23 DIAGNOSIS — R2681 Unsteadiness on feet: Secondary | ICD-10-CM | POA: Insufficient documentation

## 2017-06-23 DIAGNOSIS — R55 Syncope and collapse: Secondary | ICD-10-CM | POA: Diagnosis present

## 2017-06-23 DIAGNOSIS — R739 Hyperglycemia, unspecified: Secondary | ICD-10-CM | POA: Diagnosis not present

## 2017-06-23 DIAGNOSIS — Z794 Long term (current) use of insulin: Secondary | ICD-10-CM | POA: Insufficient documentation

## 2017-06-23 LAB — BASIC METABOLIC PANEL
ANION GAP: 9 (ref 5–15)
ANION GAP: 7 (ref 5–15)
BUN: 31 mg/dL — ABNORMAL HIGH (ref 6–20)
BUN: 32 mg/dL — ABNORMAL HIGH (ref 6–20)
CALCIUM: 8.9 mg/dL (ref 8.9–10.3)
CALCIUM: 9.4 mg/dL (ref 8.9–10.3)
CHLORIDE: 100 mmol/L — AB (ref 101–111)
CO2: 21 mmol/L — AB (ref 22–32)
CO2: 26 mmol/L (ref 22–32)
Chloride: 99 mmol/L — ABNORMAL LOW (ref 101–111)
Creatinine, Ser: 1.98 mg/dL — ABNORMAL HIGH (ref 0.61–1.24)
Creatinine, Ser: 1.84 mg/dL — ABNORMAL HIGH (ref 0.61–1.24)
GFR calc Af Amer: 40 mL/min — ABNORMAL LOW (ref >60)
GFR calc Af Amer: 44 mL/min — ABNORMAL LOW (ref >60)
GFR calc non Af Amer: 38 mL/min — ABNORMAL LOW (ref >60)
GFR, EST NON AFRICAN AMERICAN: 34 mL/min — AB (ref >60)
GLUCOSE: 413 mg/dL — AB (ref 65–99)
Glucose, Bld: 291 mg/dL — ABNORMAL HIGH (ref 65–99)
Potassium: 5.7 mmol/L — ABNORMAL HIGH (ref 3.5–5.1)
Potassium: 6.2 mmol/L — ABNORMAL HIGH (ref 3.5–5.1)
Sodium: 129 mmol/L — ABNORMAL LOW (ref 135–145)
Sodium: 133 mmol/L — ABNORMAL LOW (ref 135–145)

## 2017-06-23 LAB — CBC
HEMATOCRIT: 43.6 % (ref 39.0–52.0)
HEMOGLOBIN: 13.8 g/dL (ref 13.0–17.0)
MCH: 25.6 pg — AB (ref 26.0–34.0)
MCHC: 31.7 g/dL (ref 30.0–36.0)
MCV: 80.9 fL (ref 78.0–100.0)
Platelets: 195 10*3/uL (ref 150–400)
RBC: 5.39 MIL/uL (ref 4.22–5.81)
RDW: 16.1 % — AB (ref 11.5–15.5)
WBC: 4.4 10*3/uL (ref 4.0–10.5)

## 2017-06-23 LAB — TROPONIN I
Troponin I: 0.07 ng/mL (ref <0.03)
Troponin I: 0.05 ng/mL (ref <0.03)

## 2017-06-23 LAB — URINALYSIS, ROUTINE W REFLEX MICROSCOPIC
BACTERIA UA: NONE SEEN
Bilirubin Urine: NEGATIVE
Glucose, UA: >=500 mg/dL — AB
Ketones, ur: NEGATIVE mg/dL
Leukocytes, UA: NEGATIVE
NITRITE: NEGATIVE
PROTEIN: 30 mg/dL — AB
SPECIFIC GRAVITY, URINE: 1.007 (ref 1.005–1.030)
pH: 5 (ref 5.0–8.0)

## 2017-06-23 LAB — HEPATIC FUNCTION PANEL
ALBUMIN: 2.9 g/dL — AB (ref 3.5–5.0)
ALT: 41 U/L (ref 17–63)
AST: 43 U/L — AB (ref 15–41)
Alkaline Phosphatase: 157 U/L — ABNORMAL HIGH (ref 38–126)
Bilirubin, Direct: 0.1 mg/dL (ref 0.1–0.5)
Indirect Bilirubin: 0.6 mg/dL (ref 0.3–0.9)
TOTAL PROTEIN: 7.2 g/dL (ref 6.5–8.1)
Total Bilirubin: 0.7 mg/dL (ref 0.3–1.2)

## 2017-06-23 LAB — CBG MONITORING, ED
GLUCOSE-CAPILLARY: 415 mg/dL — AB (ref 65–99)
GLUCOSE-CAPILLARY: 316 mg/dL — AB (ref 65–99)

## 2017-06-23 LAB — GLUCOSE, CAPILLARY: Glucose-Capillary: 439 mg/dL — ABNORMAL HIGH (ref 65–99)

## 2017-06-23 LAB — PROTIME-INR
INR: 1.43
PROTHROMBIN TIME: 17.3 s — AB (ref 11.4–15.2)

## 2017-06-23 LAB — APTT: APTT: 45 s — AB (ref 24–36)

## 2017-06-23 LAB — BRAIN NATRIURETIC PEPTIDE: B Natriuretic Peptide: 315.5 pg/mL — ABNORMAL HIGH (ref 0.0–100.0)

## 2017-06-23 MED ORDER — INSULIN ASPART 100 UNIT/ML ~~LOC~~ SOLN
0.0000 [IU] | Freq: Every day | SUBCUTANEOUS | Status: DC
  Administered 2017-06-24: 3 [IU] via SUBCUTANEOUS

## 2017-06-23 MED ORDER — RIVAROXABAN 15 MG PO TABS
15.0000 mg | ORAL_TABLET | Freq: Every day | ORAL | Status: DC
  Administered 2017-06-24 (×2): 15 mg via ORAL
  Filled 2017-06-23 (×2): qty 1

## 2017-06-23 MED ORDER — ACETAMINOPHEN 325 MG PO TABS: 650.0000 mg | ORAL_TABLET | Freq: Four times a day (QID) | ORAL | Status: DC | PRN

## 2017-06-23 MED ORDER — SENNOSIDES-DOCUSATE SODIUM 8.6-50 MG PO TABS: 1.0000 | ORAL_TABLET | Freq: Every evening | ORAL | Status: DC | PRN

## 2017-06-23 MED ORDER — SODIUM CHLORIDE 0.9 % IV SOLN
INTRAVENOUS | Status: AC
  Administered 2017-06-24: 01:00:00 via INTRAVENOUS

## 2017-06-23 MED ORDER — SODIUM CHLORIDE 0.9 % IV SOLN
Freq: Once | INTRAVENOUS | Status: AC
  Administered 2017-06-23: 14:00:00 via INTRAVENOUS

## 2017-06-23 MED ORDER — INSULIN ASPART 100 UNIT/ML ~~LOC~~ SOLN
10.0000 [IU] | Freq: Once | SUBCUTANEOUS | Status: AC
  Administered 2017-06-23: 10 [IU] via SUBCUTANEOUS

## 2017-06-23 MED ORDER — LEVETIRACETAM 500 MG PO TABS
500.0000 mg | ORAL_TABLET | Freq: Every day | ORAL | Status: DC
  Administered 2017-06-24 – 2017-06-25 (×2): 500 mg via ORAL
  Filled 2017-06-23 (×2): qty 1

## 2017-06-23 MED ORDER — ACETAMINOPHEN 650 MG RE SUPP: 650.0000 mg | Freq: Four times a day (QID) | RECTAL | Status: DC | PRN

## 2017-06-23 MED ORDER — INSULIN GLARGINE 100 UNIT/ML ~~LOC~~ SOLN
15.0000 [IU] | Freq: Every day | SUBCUTANEOUS | Status: DC
  Administered 2017-06-23: 15 [IU] via SUBCUTANEOUS
  Filled 2017-06-23 (×3): qty 0.15

## 2017-06-23 MED ORDER — INSULIN ASPART 100 UNIT/ML ~~LOC~~ SOLN
0.0000 [IU] | Freq: Three times a day (TID) | SUBCUTANEOUS | Status: DC
  Administered 2017-06-24: 8 [IU] via SUBCUTANEOUS
  Administered 2017-06-24: 11 [IU] via SUBCUTANEOUS
  Administered 2017-06-25: 2 [IU] via SUBCUTANEOUS

## 2017-06-23 MED ORDER — ATORVASTATIN CALCIUM 10 MG PO TABS
10.0000 mg | ORAL_TABLET | Freq: Every day | ORAL | Status: DC
  Administered 2017-06-24 (×2): 10 mg via ORAL
  Filled 2017-06-23 (×2): qty 1

## 2017-06-23 MED ORDER — INSULIN ASPART 100 UNIT/ML ~~LOC~~ SOLN
4.0000 [IU] | Freq: Three times a day (TID) | SUBCUTANEOUS | Status: DC
  Administered 2017-06-24 – 2017-06-25 (×5): 4 [IU] via SUBCUTANEOUS

## 2017-06-23 MED ORDER — INSULIN ASPART 100 UNIT/ML ~~LOC~~ SOLN
4.0000 [IU] | Freq: Once | SUBCUTANEOUS | Status: AC
  Administered 2017-06-23: 4 [IU] via SUBCUTANEOUS
  Filled 2017-06-23: qty 1

## 2017-06-23 NOTE — ED Triage Notes (Signed)
Pt BIB EMS from [facility] for near syncopal episode. Pt reports feeling dizzy when he fell in grass, denies hitting head, injury, or LOC. On blood thinners. CBG 384 and BP 72/54 pta. Pt A&Ox4, NAD.  Hx CHF, 500 cc bolus started. EDP made aware. Pt has pacemaker and in paced rhythm.

## 2017-06-23 NOTE — ED Notes (Signed)
ED Provider at bedside. 

## 2017-06-23 NOTE — ED Notes (Signed)
Pt does not remember pacemaker brand. [employer] used for interrogation. Transmission went through at this time. Will reattempt if interrogation unsuccessful.

## 2017-06-23 NOTE — ED Provider Notes (Signed)
[facility] EMERGENCY DEPARTMENT Provider Note   CSN: [id number] Arrival date & time: 06/23/17  1141     History   Chief Complaint Chief Complaint  Patient presents with  . Near Syncope  . Hypotension  . Hyperglycemia    HPI Kyle Newton is a 62 y.o. male presenting for evaluation of dizziness and hyperglycemia.  Patient states that when he woke up this morning, he felt very dizzy whenever he stood up.  He describes it as feeling off balance, it caused him to fall once, landing face first on the grass.  He denies injury or loss of consciousness from this.  He is on blood thinners (?xarelto vs warfarin).  Additionally, patient states that this morning his blood sugars were elevated in the 300s.  This is abnormal for him.  He has not had his insulin today, but he is also not had anything to eat.  His blood sugar was normal last night.  He denies fevers, chills, cough, chest pain, shortness of breath, nausea, vomiting, abdominal pain, urinary symptoms, abnormal bowel movements.  He denies leg pain or swelling.  He states he thinks medications were changed when he was in the hospital 6 days ago, but he is not sure which ones.  Per chart review, patient is on multiple blood pressure lowering medications including carvedilol, Lasix, trazodone, BiDil.  Medical history significant for diabetes, HTN, CHF with pacemaker, aortic valve replacement. He was diagnosed with scabies while in the hospital 6 days ago.   Patient is unsure when his last echo was, states he gets his care from the [facility].  HPI  Past Medical History:  Diagnosis Date  . CHF (congestive heart failure) ([place])   . Diabetes mellitus without complication ([place])   . Hypertension     Patient Active Problem List   Diagnosis Date Noted  . Scabies 06/21/2017  . Hyperglycemia 06/17/2017    Past Surgical History:  Procedure Laterality Date  . CARDIAC VALVE REPLACEMENT          Home Medications    Prior to  Admission medications   Medication Sig Start Date End Date Taking? Authorizing Provider  atorvastatin (LIPITOR) 10 MG tablet Take 1 tablet (10 mg total) by mouth at bedtime. 06/21/17 07/21/17  Lars Mage, MD  carvedilol (COREG) 12.5 MG tablet Take 6.25 mg by mouth 2 (two) times daily with a meal.    [provider]  citalopram (CELEXA) 40 MG tablet Take 20 mg by mouth daily.    [provider]  diphenhydrAMINE (BENADRYL) 25 MG tablet Take 25 mg by mouth daily as needed for itching.    [provider]  escitalopram (LEXAPRO) 10 MG tablet Take 10 mg by mouth daily.    [provider]  furosemide (LASIX) 80 MG tablet Take 80 mg by mouth 2 (two) times daily.    [provider]  hydrocortisone cream 1 % Apply 1 application topically at bedtime as needed for itching.    [provider]  insulin aspart (NOVOLOG FLEXPEN) 100 UNIT/ML FlexPen Inject 4 Units into the skin 3 (three) times daily with meals. 06/21/17   Chundi, Verne Spurr, MD  Insulin Glargine (LANTUS SOLOSTAR) 100 UNIT/ML Solostar Pen Inject 20 Units into the skin at bedtime. 06/21/17 07/21/17  Chundi, Verne Spurr, MD  isosorbide-hydrALAZINE (BIDIL) 20-37.5 MG tablet Take 1 tablet by mouth 3 (three) times daily. 06/21/17 07/21/17  Lars Mage, MD  levETIRAcetam (KEPPRA) 500 MG tablet Take 1 tablet (500 mg total) by mouth  daily. 06/21/17 07/21/17  Lars Mage, MD  Rivaroxaban (XARELTO) 15 MG TABS tablet Take 15 mg by mouth at bedtime.    [provider]  traZODone (DESYREL) 50 MG tablet Take 50 mg by mouth at bedtime.    [provider]    Family History Family History  Problem Relation Age of Onset  . Heart failure Mother   . Diabetes Mother   . Diabetes Father     Social History Social History   Tobacco Use  . Smoking status: Current Every Day Smoker    Packs/day: 1.00    Years: 44.00    Pack years: 44.00    Types: Cigarettes  . Smokeless tobacco: Never Used    Substance Use Topics  . Alcohol use: Not Currently  . Drug use: Not Currently    Types: Marijuana, Cocaine     Allergies   Patient has no known allergies.   Review of Systems Review of Systems  Endocrine: Negative for polydipsia, polyphagia and polyuria.  Neurological: Positive for light-headedness.  Hematological: Bruises/bleeds easily.  All other systems reviewed and are negative.    Physical Exam Updated Vital Signs BP 128/87   Pulse 60   Temp (!) 97.5 F (36.4 C) (Oral)   Resp 15   SpO2 96%   Physical Exam  Constitutional: He is oriented to person, place, and time. He appears well-developed. No distress.  Chronically ill-appearing male in no distress  HENT:  Head: Normocephalic and atraumatic.  Right Ear: Tympanic membrane, external ear and ear canal normal.  Left Ear: Tympanic membrane, external ear and ear canal normal.  Nose: Nose normal.  Mouth/Throat: Uvula is midline, oropharynx is clear and moist and mucous membranes are normal.  Eyes: Pupils are equal, round, and reactive to light. Conjunctivae are normal.  PERRLA.  Neck: Normal range of motion. Neck supple.  Cardiovascular: Normal rate, regular rhythm and intact distal pulses.  Pulmonary/Chest: Effort normal and breath sounds normal. No respiratory distress. He has no wheezes.  Speaking in full sentences.  Clear lung sounds in all fields  Abdominal: Soft. He exhibits no distension and no mass. There is no tenderness. There is no guarding.  Musculoskeletal: Normal range of motion. He exhibits no edema or tenderness.  No leg pain and swelling.  Radial and pedal pulses intact bilaterally.  Strength intact x4.  Sensation intact x4.  Neurological: He is alert and oriented to person, place, and time.  Skin: Skin is warm and dry.  Psychiatric: He has a normal mood and affect.  Nursing note and vitals reviewed.    ED Treatments / Results  Labs (all labs ordered are listed, but only abnormal results are  displayed) Labs Reviewed  BASIC METABOLIC PANEL - Abnormal; Notable for the following components:      Result Value   Sodium 129 (*)    Potassium 5.7 (*)    Chloride 99 (*)    CO2 21 (*)    Glucose, Bld 413 (*)    BUN 31 (*)    Creatinine, Ser 1.98 (*)    GFR calc non Af Amer 34 (*)    GFR calc Af Amer 40 (*)    All other components within normal limits  CBC - Abnormal; Notable for the following components:   MCH 25.6 (*)    RDW 16.1 (*)    All other components within normal limits  URINALYSIS, ROUTINE W REFLEX MICROSCOPIC - Abnormal; Notable for the following components:   Color, Urine STRAW (*)  Glucose, UA >=500 (*)    Hgb urine dipstick SMALL (*)    Protein, ur 30 (*)    All other components within normal limits  BRAIN NATRIURETIC PEPTIDE - Abnormal; Notable for the following components:   B Natriuretic Peptide 315.5 (*)    All other components within normal limits  HEPATIC FUNCTION PANEL - Abnormal; Notable for the following components:   Albumin 2.9 (*)    AST 43 (*)    Alkaline Phosphatase 157 (*)    All other components within normal limits  APTT - Abnormal; Notable for the following components:   aPTT 45 (*)    All other components within normal limits  PROTIME-INR - Abnormal; Notable for the following components:   Prothrombin Time 17.3 (*)    All other components within normal limits  TROPONIN I - Abnormal; Notable for the following components:   Troponin I 0.07 (*)    All other components within normal limits  CBG MONITORING, ED - Abnormal; Notable for the following components:   Glucose-Capillary 415 (*)    All other components within normal limits  CBG MONITORING, ED - Abnormal; Notable for the following components:   Glucose-Capillary 316 (*)    All other components within normal limits    EKG EKG Interpretation  Date/Time:  Friday Jun 23 2017 11:48:44 EDT Ventricular Rate:  64 PR Interval:    QRS Duration: 164 QT Interval:  477 QTC  Calculation: 493 R Axis:   179 Text Interpretation:  new Ventricular-paced rhythm No further analysis attempted due to paced rhythm Confirmed by Blanchie Dessert [phone]) on 06/23/2017 12:08:30 PM   Radiology Ct Head Wo Contrast  Result Date: 06/23/2017 CLINICAL DATA:  Ataxia.  Stroke suspected. EXAM: CT HEAD WITHOUT CONTRAST TECHNIQUE: Contiguous axial images were obtained from the base of the skull through the vertex without intravenous contrast. COMPARISON:  None. FINDINGS: Brain: No acute infarct, hemorrhage, or mass lesion is present. Periventricular white matter changes are mildly advanced for age. 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 Brainstem and cerebellum are otherwise normal. The basal ganglia are intact. Insular ribbon is normal. No focal cortical infarct is present. The ventricles are of normal size. No significant extraaxial fluid collection is present. Vascular: No hyperdense vessel or unexpected calcification. Skull: Calvarium is intact. No focal lytic or blastic lesions are present. No acute soft tissue trauma is present. Sinuses/Orbits: The paranasal sinuses and mastoid air cells are clear. Globes and orbits are within normal limits. IMPRESSION: 1. No acute or focal abnormality to explain ataxia or dizziness. 2. Periventricular white matter changes are mildly advanced for age. The finding is nonspecific but can be seen in the setting of chronic microvascular ischemia, a demyelinating process such as multiple sclerosis, vasculitis, complicated migraine headaches, or as the sequelae of a prior infectious or inflammatory process. 3. No acute trauma. Electronically Signed   By: San Morelle M.D.   On: 06/23/2017 14:12    Procedures Procedures (including critical care time)  Medications Ordered in ED Medications  insulin aspart (novoLOG) injection 4 Units (4 Units Subcutaneous Given 06/23/17 1403)  0.9 %  sodium chloride infusion ( Intravenous New Bag/Given  06/23/17 1415)     Initial Impression / Assessment and Plan / ED Course  I have reviewed the triage vital signs and the nursing notes.  Pertinent labs & imaging results that were available during my care of the patient were reviewed by me and considered in my medical decision making (see  chart for details).     Patient presenting for evaluation of dizziness, hypotension, and hyperglycemia.  Physical exam shows patient who is hypotensive, in no acute distress.  No chest pain or shortness of breath.  Symptom-free at rest.  Positive orthostatics.  Will give small bolus of fluid and maintenance rate, as patient has CHF.  Will obtain labs, urine, EKG, CT head.  Labs show hyperkalemia, hyperglycemia, mildly elevated SCr. Trop elevated at 0.07, likely 2/2 strain. No CP or SOB. Doubt ACS. Will interrogate pacemaker. CT head negative for acute findings. UA without infection. EKG shows paced rhythm.  Will give dose of home insulin, as patient has not had insulin today.  Case discussed with attending, Dr. Maryan Rued evaluated patient.  Symptoms improved with bolus.  Hypotension likely related to medications, as he is on multiple medications that can lower blood pressure.  Patient was very hypotensive and symptomatic, feel this needs to be managed prior to going home.    Discussed with internal medicine teaching service, patient to be admitted.  Final Clinical Impressions(s) / ED Diagnoses   Final diagnoses:  Hypotension, unspecified hypotension type  Hyperkalemia  Hyperglycemia    ED Discharge Orders    None       Franchot Heidelberg, PA-C 06/23/17 2152    Blanchie Dessert, MD 06/25/17 2145

## 2017-06-23 NOTE — H&P (Signed)
Date: 06/23/2017               Patient Name:  Kyle Newton MRN: [id number]  DOB: 11/26/1955 Age / Sex: 62 y.o., male   PCP: Patient, No Pcp Per         Medical Service: Internal Medicine Teaching Service         Attending Physician: Dr. Sid Falcon, MD    First Contact: Dr. Trilby Drummer Pager: [phone]  Second Contact: Dr. Danford Bad Pager: [phone]       After Hours (After 5p/  First Contact Pager: [phone]  weekends / holidays): Second Contact Pager: [phone]   Chief Complaint: dizziness  History of Present Illness:  Kyle Newton is a 62yo male with PMH of HTN, diabetes, CHF with ICD, and aortic valve replacement who presents with dizziness.  He was recently admitted 5/25-5/29 for management of HHS and scabies. He reports that yesterday, he was doing well but did not take his lantus last night because he forgot. He woke up this morning, had a cup of coffee, went to lie back down, and then when he got up again to go outside, he started feeling dizzy. He fell forward and hit his face on the grass, prompting his re-admission. He is not sure why his blood pressure was lower today.  He denies any changes since discharge. Denies recent illness. He states that he is currently living at a shelter and does have access to his medications. He fills his medications on his own in a pill box and cannot say for sure whether he took the correct amount of his medications. He reports that his PO intake is at baseline. He denies abdominal pain, leg swelling, dysuria, chest pain, shortness of breath, N/V, diarrhea, or headaches. He reports his rash and skin itching has improved but is still present.  He smokes ~1ppd, denies alcohol use or other illicit drug use. He typically gets care at the [facility]. He used to live in [place], but recently moved to [place].  ED Course: - BP 92/67, temp 97.5, RR 12, HR 60, O2 99% on RA. Low blood pressures noted when completing orthostatics - CBC unremarkable. BMP with Na  129, K 5.7, bicarb 21, glucose 413, Cr 1.98. UA with 500 glucose, no ketones. AP 157. INR 1.43, trop 0.07, BNP 315 - S/p 4 units Novolog - CT head negative for acute or focal abnormality to explain dizziness.  Meds:  No outpatient medications have been marked as taking for the 06/23/17 encounter [facility] Encounter).  - Atorvastatin 10mg  QHS - Carvedilol 6.25mg  BID - Citalopram 40mg  daily - Benadryl 25mg  daily PRN - escitalopram 10mg  daily - furosemide 80mg  BID - hydrocortisone cream 1% PRN - novolog 4u TID WC - lantus 20u QHS - bidil TID - keppra 500mg  daily - xarelto 15mg  QHS - trazodone 50mg  QHS  Allergies: Allergies as of 06/23/2017  . (No Known Allergies)   Past Medical History:  Diagnosis Date  . CHF (congestive heart failure) ([place])   . Diabetes mellitus without complication ([place])   . Hypertension    Family History:  Family History  Problem Relation Age of Onset  . Heart failure Mother   . Diabetes Mother   . Diabetes Father    Social History:  He smokes ~1ppd, denies alcohol use or other illicit drug use.  Currently living in a shelter He typically gets care at the [facility].  He used to live in [place], but recently moved to [place].  Review  of Systems: A complete ROS was negative except as per HPI.  Physical Exam: Blood pressure (!) 118/91, pulse (!) 59, temperature (!) 97.5 F (36.4 C), temperature source Oral, resp. rate 14, SpO2 100 %. GEN: Well-nourished, alert and oriented. NAD. HENT: [place]/AT. Moist mucous membranes. No visible lesions. EYES: PERRL. Sclera non-icteric. Conjunctiva clear. RESP: Clear to auscultation bilaterally. No wheezes, rales, or rhonchi. No increased work of breathing. CV: Normal rate and regular rhythm. Systolic murmur, best heard at RUSB. No LE edema. ABD: Soft. Non-tender. Non-distended. Normoactive bowel sounds. EXT: No edema. Warm and well perfused. NEURO: Cranial nerves II-XII grossly intact. Able to lift all four extremities  against gravity. No apparent audiovisual hallucinations. Speech fluent and appropriate. PSYCH: Patient is calm and pleasant. Appropriate affect. Well-groomed; speech is appropriate and on-subject. SKIN: Diffuse excoriations and small linear burrows  EKG: personally reviewed my interpretation is ventricular-paced rhythm  CT head 1. No acute or focal abnormality to explain ataxia or dizziness. 2. Periventricular white matter changes are mildly advanced for age. The finding is nonspecific but can be seen in the setting of chronic microvascular ischemia, a demyelinating process such as multiple sclerosis, vasculitis, complicated migraine headaches, or as the sequelae of a prior infectious or inflammatory process. 3. No acute trauma.  Assessment & Plan by Problem: Active Problems:   Orthostatic hypotension  Kyle Newton is a 62yo male with PMH of HTN, diabetes, CHF with ICD, and aortic valve replacement who presents with dizziness leading to a fall, noted to have hyperglycemia.  Hypotension, S/p fall Unclear etiology. Head CT shows no evidence of trauma or etiology for his dizziness. No infectious s/s. He was noted to have an elevated troponin, but denies chest pain or shortness of breath. He does have a history of aortic valve replacement and a previously noted systolic murmur is audible. Sodium noted to be 129 with K 5.7, not on chronic steroids, but will evaluate for adrenal insufficiency. His BP has improved with 500cc IVF. - Telemetry - TSH - TTE - PT/OT eval - CBC, CMP in AM - AM cortisol  Hyperglycemia Diabetes Hyperglycemia likely 2/2 not taking insulin as prescribed. Home regimen includes Lantus 20 units QHS and Novolog 4 units TID WC. - Lantus 15 units QHS - Novolog 4 units TID WC - SSI-M TID WC & QHS - CBG monitoring  Renal dysfunction Cr 1.98 and K 5.7 on admission. Baseline renal function is difficult to assess, as we do not have his previous records, although during last  admission, his Cr remained ~2-2.2. - BMP today - CMP in AM  Scabies Recently treated for scabies with two doses of ivermectin (5/26 and 5/29), however given his persistent symptoms and possibility of new linear erosions, will re-treat. - Can repeat ivermectin 15080mcg on 6/2 - Contact precautions  Troponinemia CHF with ICD, HTN S/p aortic valve replacement Troponin 0.07 although no chest pain or shortness of breath. No records of TTE in our chart, although on last admission, bedside ultrasound suggested moderately reduced EF. Home regimen includes lasix 80mg  BID, carvedilol 6.25mg  BID, and BiDil 20-37.5mg  TID. - Trend troponins - EKG in AM - TTE - Continue rivaroxaban 15mg  QHS - Daily weights, I/Os - Holding home lasix, carvedilol, and BiDil 2/2 hypotension - Continue home atorvastatin 10mg  QHS  Hx of seizures - Continue home keppra 500mg  daily  Diet: CM/HH diet VTE PPx: Lovenox Code Status: Full code Dispo: Admit patient to Inpatient with expected length of stay greater than 2 midnights.  Signed: Ronalee Red,  Anderson Malta, MD 06/23/2017, 7:09 PM  Pager: Mamie Nick [phone]

## 2017-06-23 NOTE — ED Notes (Signed)
Patient transported to CT 

## 2017-06-24 ENCOUNTER — Encounter (HOSPITAL_COMMUNITY): Payer: Self-pay | Admitting: *Deleted

## 2017-06-24 ENCOUNTER — Observation Stay (HOSPITAL_BASED_OUTPATIENT_CLINIC_OR_DEPARTMENT_OTHER): Payer: Medicare Other

## 2017-06-24 DIAGNOSIS — I110 Hypertensive heart disease with heart failure: Secondary | ICD-10-CM | POA: Diagnosis not present

## 2017-06-24 DIAGNOSIS — I959 Hypotension, unspecified: Secondary | ICD-10-CM

## 2017-06-24 DIAGNOSIS — I509 Heart failure, unspecified: Secondary | ICD-10-CM | POA: Diagnosis not present

## 2017-06-24 DIAGNOSIS — F17210 Nicotine dependence, cigarettes, uncomplicated: Secondary | ICD-10-CM | POA: Diagnosis not present

## 2017-06-24 DIAGNOSIS — I951 Orthostatic hypotension: Secondary | ICD-10-CM

## 2017-06-24 DIAGNOSIS — E875 Hyperkalemia: Secondary | ICD-10-CM | POA: Diagnosis not present

## 2017-06-24 LAB — CBC
HEMATOCRIT: 45.9 % (ref 39.0–52.0)
HEMOGLOBIN: 14.6 g/dL (ref 13.0–17.0)
MCH: 25.8 pg — ABNORMAL LOW (ref 26.0–34.0)
MCHC: 31.8 g/dL (ref 30.0–36.0)
MCV: 81.2 fL (ref 78.0–100.0)
Platelets: 186 10*3/uL (ref 150–400)
RBC: 5.65 MIL/uL (ref 4.22–5.81)
RDW: 16 % — AB (ref 11.5–15.5)
WBC: 4.2 10*3/uL (ref 4.0–10.5)

## 2017-06-24 LAB — BASIC METABOLIC PANEL
Anion gap: 7 (ref 5–15)
BUN: 33 mg/dL — AB (ref 6–20)
CHLORIDE: 102 mmol/L (ref 101–111)
CO2: 25 mmol/L (ref 22–32)
CREATININE: 1.97 mg/dL — AB (ref 0.61–1.24)
Calcium: 8.9 mg/dL (ref 8.9–10.3)
GFR calc Af Amer: 40 mL/min — ABNORMAL LOW (ref >60)
GFR calc non Af Amer: 35 mL/min — ABNORMAL LOW (ref >60)
Glucose, Bld: 427 mg/dL — ABNORMAL HIGH (ref 65–99)
Potassium: 6 mmol/L — ABNORMAL HIGH (ref 3.5–5.1)
SODIUM: 134 mmol/L — AB (ref 135–145)

## 2017-06-24 LAB — COMPREHENSIVE METABOLIC PANEL
ALBUMIN: 2.8 g/dL — AB (ref 3.5–5.0)
ALT: 37 U/L (ref 17–63)
ANION GAP: 7 (ref 5–15)
AST: 39 U/L (ref 15–41)
Alkaline Phosphatase: 156 U/L — ABNORMAL HIGH (ref 38–126)
BUN: 31 mg/dL — ABNORMAL HIGH (ref 6–20)
CO2: 22 mmol/L (ref 22–32)
Calcium: 9 mg/dL (ref 8.9–10.3)
Chloride: 105 mmol/L (ref 101–111)
Creatinine, Ser: 1.69 mg/dL — ABNORMAL HIGH (ref 0.61–1.24)
GFR calc Af Amer: 48 mL/min — ABNORMAL LOW (ref >60)
GFR calc non Af Amer: 42 mL/min — ABNORMAL LOW (ref >60)
Glucose, Bld: 265 mg/dL — ABNORMAL HIGH (ref 65–99)
POTASSIUM: 4.5 mmol/L (ref 3.5–5.1)
SODIUM: 134 mmol/L — AB (ref 135–145)
Total Bilirubin: 0.4 mg/dL (ref 0.3–1.2)
Total Protein: 6.7 g/dL (ref 6.5–8.1)

## 2017-06-24 LAB — GLUCOSE, CAPILLARY
GLUCOSE-CAPILLARY: 82 mg/dL (ref 65–99)
GLUCOSE-CAPILLARY: 251 mg/dL — AB (ref 65–99)
Glucose-Capillary: 236 mg/dL — ABNORMAL HIGH (ref 65–99)
Glucose-Capillary: 311 mg/dL — ABNORMAL HIGH (ref 65–99)

## 2017-06-24 LAB — ECHOCARDIOGRAM COMPLETE
HEIGHTINCHES: 69 in
WEIGHTICAEL: 2608 [oz_av]

## 2017-06-24 LAB — CORTISOL-AM, BLOOD: CORTISOL - AM: 10.3 ug/dL (ref 6.7–22.6)

## 2017-06-24 LAB — TSH: TSH: 2.289 u[IU]/mL (ref 0.350–4.500)

## 2017-06-24 LAB — TROPONIN I: Troponin I: 0.04 ng/mL (ref <0.03)

## 2017-06-24 MED ORDER — IVERMECTIN 3 MG PO TABS
200.0000 ug/kg | ORAL_TABLET | Freq: Once | ORAL | Status: AC
  Administered 2017-06-25: 15000 ug via ORAL
  Filled 2017-06-24: qty 5

## 2017-06-24 MED ORDER — ALBUTEROL SULFATE (2.5 MG/3ML) 0.083% IN NEBU
10.0000 mg | INHALATION_SOLUTION | Freq: Once | RESPIRATORY_TRACT | Status: AC
  Administered 2017-06-24: 10 mg via RESPIRATORY_TRACT
  Filled 2017-06-24: qty 12

## 2017-06-24 MED ORDER — SODIUM CHLORIDE 0.9 % IV SOLN
1.0000 g | Freq: Once | INTRAVENOUS | Status: AC
  Administered 2017-06-24: 1 g via INTRAVENOUS
  Filled 2017-06-24: qty 10

## 2017-06-24 MED ORDER — INSULIN GLARGINE 100 UNIT/ML ~~LOC~~ SOLN
20.0000 [IU] | Freq: Every day | SUBCUTANEOUS | Status: DC
  Administered 2017-06-24: 20 [IU] via SUBCUTANEOUS
  Filled 2017-06-24: qty 0.2

## 2017-06-24 MED ORDER — PERFLUTREN LIPID MICROSPHERE
1.0000 mL | INTRAVENOUS | Status: AC | PRN
  Filled 2017-06-24: qty 10

## 2017-06-24 MED ORDER — SODIUM POLYSTYRENE SULFONATE PO POWD
15.0000 g | Freq: Once | ORAL | Status: AC
  Administered 2017-06-24: 15 g via ORAL
  Filled 2017-06-24: qty 15

## 2017-06-24 MED ORDER — DEXTROSE 50 % IV SOLN: 1.0000 | Freq: Once | INTRAVENOUS | Status: DC

## 2017-06-24 MED ORDER — INSULIN REGULAR BOLUS VIA INFUSION: 10.0000 [IU] | Freq: Once | INTRAVENOUS | Status: DC

## 2017-06-24 MED ORDER — ENSURE ENLIVE PO LIQD: 237.0000 mL | Freq: Two times a day (BID) | ORAL | Status: DC

## 2017-06-24 MED ORDER — PERFLUTREN LIPID MICROSPHERE
INTRAVENOUS | Status: AC
  Administered 2017-06-24: 2 mL via INTRAVENOUS
  Filled 2017-06-24: qty 10

## 2017-06-24 NOTE — Progress Notes (Signed)
  Date: 06/24/2017  Patient name: Kyle Newton  Medical record number: [id number]  Date of birth: 08/02/1955   I have seen and evaluated Kyle Newton and discussed their care with the Residency Team. Briefly, Kyle Newton is a 62yo man with PMH of HTN, DM, CHF with ICD in place, AVR who presented with dizziness.  He was recently admitted for HHS and scabies.  He was discharged to a shelter a few days ago.  On the morning of admission, he felt dizzy and fell and hit his face in the grass.  He notes that he might have been taking too much of his medications and cannot remember which ones he took.  His BP was initially 92/67, Na was 129 and his Cr was at baseline.  He was noted to have an elevated K which was new and has received insulin, calcium and kayexalate with resolution to normal.  CT head done in the ED did not show any acute abnormality.    Fam Hx, and/or Soc Hx : FH of CHF, DM.  Current smoker, gets care at [facility]:   06/23/17 2020 06/24/17 0500  BP: 120/90 114/89  Pulse: 61 64  Resp: 18 18  Temp: 97.8 F (36.6 C) (!) 97.4 F (36.3 C)  SpO2: 99% 98%   General: Thin elderly man, NAD Eyes: Anicteric sclerae, no pallor HENT: MMM, NCAT CV: RR, NR, + systolic murmur Pulm: CTAB, no wheezing Abd: Soft, NT, ND, +BS Ext: No edema Skin: diffuse excoriations and burrows Neuro: Exam grossly normal, no weakness, speech appropriate  EKG: V paced.  Unchanged.  Assessment and Plan: I have seen and evaluated the patient as outlined above. I agree with the formulated Assessment and Plan as detailed in the residents' note, with the following changes:   1. Hypotension, Syncope - Given electrolyte abnormalities, would check for hypocortisolemia.  Would also look and see if ivermectin may be a causative factor given recent use.  Home meds do not reveal anything that would raise K.   - TSH normal - TTE pending - PT/OT eval - AM Cortisol pending - Recheck orthostatics  2. Hyperkalemia -  Resolved - AM cortisol  3. CHF with elevated Troponin - EKG shows v pacing, appears unchanged from May in our system - Holding home meds given hypotension.  Once BP improves, would start adding these back to avoid volume overload.   Other issues per resident notes.   Sid Falcon, MD 6/1/201912:07 PM

## 2017-06-24 NOTE — Care Management Obs Status (Signed)
[facility] NOTIFICATION   Patient Details  Name: Kyle Newton MRN: [id number] Date of Birth: 08/22/1955   Medicare Observation Status Notification Given:  Yes    Kristen Cardinal, RN 06/24/2017, 3:46 PM

## 2017-06-24 NOTE — Evaluation (Signed)
Physical Therapy Evaluation Patient Details Name: Kyle Newton MRN: [id number] DOB: [birth year] Today's Date: 06/24/2017   History of Present Illness  Pt is a 62yo male with PMH of HTN, diabetes, CHF with ICD, and aortic valve replacement who presented with dizziness and fall.     Clinical Impression  PT eval complete. Pt is independent with all mobility. Orthostatic BP taken: supine 118/91, sitting 129/87, initial stance 111/83, and standing after 3 minutes 120/94.  No further PT intervention indicated. PT signing off.    Follow Up Recommendations No PT follow up    Equipment Recommendations  None recommended by PT    Recommendations for Other Services       Precautions / Restrictions Precautions Precautions: None      Mobility  Bed Mobility Overal bed mobility: Independent                Transfers Overall transfer level: Independent                  Ambulation/Gait Ambulation/Gait assistance: Independent Ambulation Distance (Feet): 100 Feet Assistive device: None Gait Pattern/deviations: WFL(Within Functional Limits)   Gait velocity interpretation: 1.31 - 2.62 ft/sec, indicative of limited community ambulator General Gait Details: steady gait  Stairs            Wheelchair Mobility    Modified Rankin (Stroke Patients Only)       Balance Overall balance assessment: No apparent balance deficits (not formally assessed)                                           Pertinent Vitals/Pain Pain Assessment: No/denies pain    Home Living Family/patient expects to be discharged to:: Group home                      Prior Function Level of Independence: Independent               Hand Dominance        Extremity/Trunk Assessment   Upper Extremity Assessment Upper Extremity Assessment: Overall WFL for tasks assessed    Lower Extremity Assessment Lower Extremity Assessment: Overall WFL for tasks assessed     Cervical / Trunk Assessment Cervical / Trunk Assessment: Normal  Communication   Communication: No difficulties  Cognition Arousal/Alertness: Awake/alert Behavior During Therapy: WFL for tasks assessed/performed Overall Cognitive Status: Within Functional Limits for tasks assessed                                        General Comments      Exercises     Assessment/Plan    PT Assessment Patent does not need any further PT services  PT Problem List         PT Treatment Interventions      PT Goals (Current goals can be found in the Care Plan section)  Acute Rehab PT Goals Patient Stated Goal: not stated PT Goal Formulation: All assessment and education complete, DC therapy    Frequency     Barriers to discharge        Co-evaluation               AM-PAC PT "6 Clicks" Daily Activity  Outcome Measure Difficulty turning over in bed (including adjusting bedclothes, sheets and  blankets)?: None Difficulty moving from lying on back to sitting on the side of the bed? : None Difficulty sitting down on and standing up from a chair with arms (e.g., wheelchair, bedside commode, etc,.)?: None Help needed moving to and from a bed to chair (including a wheelchair)?: None Help needed walking in hospital room?: None Help needed climbing 3-5 steps with a railing? : None 6 Click Score: 24    End of Session   Activity Tolerance: Patient tolerated treatment well Patient left: in chair;with call bell/phone within reach Nurse Communication: Mobility status PT Visit Diagnosis: Unsteadiness on feet (R26.81)    Time: 4081-4481 PT Time Calculation (min) (ACUTE ONLY): 27 min   Charges:   PT Evaluation $PT Eval Low Complexity: 1 Low PT Treatments $Therapeutic Activity: 8-22 mins   PT G Codes:        Lorrin Goodell, PT  Office # [phone] Pager [phone]   Lorriane Shire 06/24/2017, 10:10 AM

## 2017-06-24 NOTE — Progress Notes (Signed)
   Subjective: Patient seen resting comfortably in his bed this mornings. He states he is feeling well. He has been able to get up and go back and forth to the bathroom without further lightheadedness. He states he feels about back to his normal self now.   Objective:  Vital signs in last 24 hours: Vitals:   06/23/17 1915 06/23/17 1945 06/23/17 2020 06/24/17 0500  BP: (!) 141/102 100/81 120/90 114/89  Pulse: 60  61 64  Resp: 14 20 18 18   Temp:   97.8 F (36.6 C) (!) 97.4 F (36.3 C)  TempSrc:   Oral Oral  SpO2: 99%  99% 98%  Weight:   164 lb 10.9 oz (74.7 kg) 163 lb (73.9 kg)  Height:   5\' 9"  (1.753 m)    Physical Exam  Constitutional: He appears well-developed and well-nourished.  HENT:  Head: Normocephalic and atraumatic.  Eyes: EOM are normal. Right eye exhibits no discharge. Left eye exhibits no discharge.  Cardiovascular: Normal rate, regular rhythm, normal heart sounds and intact distal pulses.  Pulmonary/Chest: Effort normal and breath sounds normal. No respiratory distress.  Abdominal: Soft. Bowel sounds are normal. He exhibits no distension. There is no tenderness.  Musculoskeletal: He exhibits no edema or deformity.  Neurological: He is alert.  Skin: Skin is warm and dry.  Diffuse escoriations   Assessment/Plan: 62yo male with PMH of HTN, diabetes, CHF with ICD, and aortic valve replacement who presents with dizziness leading to a fall, noted to have hyperglycemia.  Hypotension, S/p fall: Unclear etiology, patient may have been taking his medications inappropirately. Head CT show negative for trauma or etiology for his dizziness. No infectious s/s. He was noted to have an elevated troponin, which down trended. He does have a history of aortic valve replacement, murmur on exam. BP improved with 500cc IVF. > TSH WNL > AM cortisol 10.3 (Normal) > PT recommends no follow up - Cardiac Monitoring - TTE - BMP in AM  Hyperglycemia Diabetes: Hyperglycemia likely 2/2 not  taking insulin as prescribed. Home regimen: Lantus 20 units QHS and Novolog 4 units TID WC. > CBGs 200s-300s today - Increase Lantus to 20units QHS - Novolog 4 units TID WC - SSI-M TID WC & QHS - CBG monitoring  Renal dysfunction: Cr 1.98 and K 5.7 on admission. (Baseline Cr unclear as we do not have his records, although during last admission, his Cr remained ~2-2.2. > Cr improving: 1.98 >> 1.69 > K 6.0 overnight > Patient given Albuterol, Ca Gluconate, and Kayexalate (had just received Insulin) >> K improved to 4.5 on morning labs - BMP in AM  Scabies Recently treated for scabies with two doses of ivermectin (5/26 and 5/29), however given his persistent symptoms and possibility of new linear erosions, will re-treat. - Repeat Ivermectin 15089mcg on 6/2 - Contact precautions  Troponinemia CHF, HTN S/p aortic valve replacement Troponin 0.07 although no chest pain or shortness of breath. No TTE in EMR. Home regimen: Lasix 80mg  BID, Carvedilol 6.25mg  BID, and BiDil 20-37.5mg  TID. > Troponin downtrended: 0.07 >> 0.05 >> 0.04 - Continue rivaroxaban 15mg  QHS - Daily weights, I/Os - Holding home lasix, carvedilol, and BiDil 2/2 hypotension - Continue home atorvastatin 10mg  QHS - TTE  Hx of seizures - Continue home keppra 500mg  daily  Diet: CM/HH diet VTE PPx: Lovenox Code Status: Full code  Dispo: Anticipated discharge in 1-2 days  Neva Seat, MD 06/24/2017, 1:42 PM Pager: [phone]

## 2017-06-24 NOTE — Progress Notes (Signed)
Echocardiogram 2D Echocardiogram has been performed.  Kyle Newton 06/24/2017, 3:00 PM

## 2017-06-25 LAB — BASIC METABOLIC PANEL
ANION GAP: 7 (ref 5–15)
BUN: 29 mg/dL — ABNORMAL HIGH (ref 6–20)
CHLORIDE: 104 mmol/L (ref 101–111)
CO2: 25 mmol/L (ref 22–32)
Calcium: 8.8 mg/dL — ABNORMAL LOW (ref 8.9–10.3)
Creatinine, Ser: 1.57 mg/dL — ABNORMAL HIGH (ref 0.61–1.24)
GFR calc Af Amer: 53 mL/min — ABNORMAL LOW (ref >60)
GFR, EST NON AFRICAN AMERICAN: 46 mL/min — AB (ref >60)
Glucose, Bld: 210 mg/dL — ABNORMAL HIGH (ref 65–99)
POTASSIUM: 4.2 mmol/L (ref 3.5–5.1)
Sodium: 136 mmol/L (ref 135–145)

## 2017-06-25 LAB — GLUCOSE, CAPILLARY
Glucose-Capillary: 150 mg/dL — ABNORMAL HIGH (ref 65–99)
Glucose-Capillary: 100 mg/dL — ABNORMAL HIGH (ref 65–99)

## 2017-06-25 MED ORDER — DIPHENHYDRAMINE-ZINC ACETATE 2-0.1 % EX CREA
TOPICAL_CREAM | Freq: Three times a day (TID) | CUTANEOUS | Status: DC | PRN
  Filled 2017-06-25: qty 28

## 2017-06-25 NOTE — Progress Notes (Signed)
Subjective: Patient seen resting comfortably in his bed this morning watching TV. Patient reports feeling well, stating this is "the best he's felt in a longtime." When asked whats different, he stated "everything, I just feel better." Able to ambulate about room without dizziness and has been tolerating meals without issue. Patient feels comfortable with discharge today and a one-time hospital follow-up in the [facility] this week until he's able to see his [place] provider.   Objective:  Vital signs in last 24 hours: Vitals:   06/24/17 0500 06/24/17 1538 06/24/17 2025 06/25/17 0514  BP: 114/89 123/86 (!) 126/94 121/90  Pulse: 64 60 62 63  Resp: 18  (!) 21 16  Temp: (!) 97.4 F (36.3 C) 98.4 F (36.9 C) 99.1 F (37.3 C) 98.4 F (36.9 C)  TempSrc: Oral Oral Oral Oral  SpO2: 98% 98% 100% 99%  Weight: 163 lb (73.9 kg)   164 lb 11.2 oz (74.7 kg)  Height:       Physical Exam  Constitutional: He appears well-developed and well-nourished.  HENT:  Head: Normocephalic and atraumatic.  Eyes: EOM are normal. Right eye exhibits no discharge. Left eye exhibits no discharge.  Cardiovascular: Normal rate, regular rhythm, normal heart sounds and intact distal pulses.  Pulmonary/Chest: Effort normal and breath sounds normal. No respiratory distress.  Abdominal: Soft. Bowel sounds are normal. He exhibits no distension. There is no tenderness.  Musculoskeletal: He exhibits no edema or deformity.  Neurological: He is alert.  Skin: Skin is warm and dry.  Diffuse escoriations   Assessment/Plan: 62yo male with PMH of HTN, diabetes, CHF with ICD, and aortic valve replacement who presents with dizziness, fall and hyperglycemia.   Orthostatic Hypotension, Fall: Likely related to medication overuse. He has improved significantly with minimal IVF and holding several of his medications. His orthostatic hypotension has resolved on repeat testing and has been able to ambulate about the room and hall without  dizziness. Patient worked well with PT per their note and no further PT was recommended.  -DC home, however will continue to hold possible offending medications until hospital follow-up appointment given his stability off of these  Type 2 Diabetes: CBGs much improved on home Lantus 20units QHS and Novolog 4 units TID WC with minimal sliding scale insulin in the past 24-hours. Suspect he would do well with his regular home dose of insulin. He states he has these prescriptions at home and does not need refills.    Renal Insufficiency: Unclear if acute or chronic CKD but suspect some degree of baseline Cr elevation given his trends.  Cr today 1.57 and hyperkalemia has resolved. Will need follow-up renal function at his hospital follow-up appointment.   Scabies Treated again with Ivermectin 6/2. Recently treated for scabies with two doses of ivermectin (5/26 and 5/29).  Troponinemia CHF, HTN S/p aortic valve replacement Patient had minimal troponin elevation in setting of hypotension on admission. Denied any chest pain or SOB, but did have orthostatic hypotension. Unable to [facility] TTE obtained here with mildly reduced EF, and hypokinesis of mid-apical anteroseptal myocardium. He has known CAD as well as CHF with implantable pacer. If patient has not already had an ischemic evaluation at the [place], he will require one. Will include in DC summary which will be sent to PCP.  -Continuing to hold Lasix 80mg  BID [euvolemic], Coreg 6.25mg  BID and Bidil. Will re-evaluate resuming these medications at hospital follow-up.  - Continue home atorvastatin 10mg   QHS  Hx of seizures - Continue home keppra 500mg  daily  Diet: CM/HH diet VTE PPx: Lovenox Code Status: Full code  Dispo: Anticipated discharge today with follow-up in [facility] next week. Have messaged front-desk staff to assist with scheduling.   Sovereign Ramiro, [place]ina, DO 06/25/2017, 12:21 PM Pager:  [phone]

## 2017-06-25 NOTE — Discharge Instructions (Signed)
Mr. Brysten, Reister were admitted for dizziness and low blood pressure. This is most likely related to your medications. We have held several of your home medications with improvement of your symptoms and would like you to STOP TAKING the medications below:  STOP TAKING: 1. Coreg 6.25mg  twice daily 2. Bidil 20-37.5mg  three-times daily 3. Lasix 80mg  twice daily  [facility] will call you tomorrow to schedule a follow-up appointment in our clinic this week. We would like to see how you are feeling and check your blood pressure off of these medications. Of course, you should still follow-up with your physician at the [facility]. Please call the clinic at [phone] if you do not hear from [place] on Monday.

## 2017-06-25 NOTE — Progress Notes (Signed)
  Date: 06/25/2017  Patient name: Kyle Newton  Medical record number: [id number]  Date of birth: [birth year]   This patient's plan of care was discussed with the house staff. Please see Dr. Rober Minion note for complete details. I concur with her findings.   Sid Falcon, MD 06/25/2017, 1:33 PM

## 2017-06-29 NOTE — Progress Notes (Signed)
Name: Kyle Newton MRN: [id number] DOB: 13-Jan-1956 62 y.o. PCP: [facility]  Date of Admission: 06/23/2017 11:41 AM Date of Discharge: 06/25/2017 Attending Physician: Gilles Chiquito, MD  Discharge Diagnosis: 1. Orthostatic Hypotension  Discharge Medications: Allergies as of 06/25/2017   No Known Allergies     Medication List    STOP taking these medications   acetaminophen 325 MG tablet Commonly known as:  TYLENOL   carvedilol 12.5 MG tablet Commonly known as:  COREG   diphenhydrAMINE 25 MG tablet Commonly known as:  BENADRYL   furosemide 80 MG tablet Commonly known as:  LASIX   isosorbide-hydrALAZINE 20-37.5 MG tablet Commonly known as:  BIDIL   traZODone 50 MG tablet Commonly known as:  DESYREL     TAKE these medications   atorvastatin 10 MG tablet Commonly known as:  LIPITOR Take 1 tablet (10 mg total) by mouth at bedtime.   citalopram 40 MG tablet Commonly known as:  CELEXA Take 20 mg by mouth daily.   escitalopram 10 MG tablet Commonly known as:  LEXAPRO Take 10 mg by mouth daily.   hydrocortisone cream 1 % Apply 1 application topically at bedtime as needed for itching.   insulin aspart 100 UNIT/ML FlexPen Commonly known as:  NOVOLOG FLEXPEN Inject 4 Units into the skin 3 (three) times daily with meals. What changed:    how much to take  when to take this   Insulin Glargine 100 UNIT/ML Solostar Pen Commonly known as:  LANTUS SOLOSTAR Inject 20 Units into the skin at bedtime.   levETIRAcetam 500 MG tablet Commonly known as:  KEPPRA Take 1 tablet (500 mg total) by mouth daily.   loratadine 10 MG tablet Commonly known as:  CLARITIN Take 10 mg by mouth daily.   Rivaroxaban 15 MG Tabs tablet Commonly known as:  XARELTO Take 15 mg by mouth at bedtime.       Disposition and follow-up:   [name] was discharged from [facility] in stable condition.  At the hospital follow up visit please address:  1.  Orthostatic hypotension: Please review what medications he is currently taking and assess for continued symptoms of dizziness or weakness. His Coreg, Furosemide and Bidil were held and will be important to reassess if these medications can be continued.  Volume status: His home lasix was held during admission and at discharge due to orthostatic hypotension. He was euvolemic at time of discharge. Please evaluate volume status and appropriateness of resuming this.   2.  Labs / imaging needed at time of follow-up: none  3.  Pending labs/ test needing follow-up: none  Hospital Course by problem list:  Orthostatic Hypotension Patient presented 06/23/17 for evaluation of dizziness and syncope. He was in his usual state of health until day of admission when he experienced dizziness with standing and fell-forward hitting face on grass. Denied changes since his recent discharge, illness, changes in medication and noted he does have access to his medications but cannot say for sure whether or not he might have taken extra of his medications. Positive orthostatic vitals on admission which improved with 500cc bolus. Home lasix, carvedilol and Bidil were held during admission with improvement of symptoms. Orthostatic hypotension resolved and patient was able to ambulate without dizziness. He was discharged home with instructions to continue holding lasix, carvedilol, bidil and follow-up closely with PCP. Message sent to [facility] front desk staff for 1x hospital follow-up visit, if patient not able to see PCP in 1 week.  CKD Hyperkalemia Baseline Cr difficult to ascertain as patient sees the [place] and we were unable to view these records. Cr stable around 2, which seemed to be his baseline during prior admissions. He did have hyperkalemia at 5.7 on admission which was treated with albuterol and insulin.   Scabies Patient continued to have itching and what appeared to be continued scabies lesions and was treated with  Ivermectin 6/2.   Discharge Vitals:   BP 121/90 (BP Location: Left Arm)   Pulse 63   Temp 98.4 F (36.9 C) (Oral)   Resp 16   Ht 5\' 9"  (1.753 m)   Wt 164 lb 11.2 oz (74.7 kg)   SpO2 99%   BMI 24.32 kg/m   Pertinent Labs, Studies, and Procedures:  CT head: no acute pathology  Signed: Carrissa Taitano, DO 06/29/2017, 4:29 PM   Pager: [phone]

## 2017-07-05 NOTE — Discharge Summary (Signed)
Name: Kyle Newton MRN: [id number] DOB: 1955-09-14 62 y.o. PCP: [facility]  Date of Admission: 06/23/2017 11:41 AM Date of Discharge: 06/25/2017 Attending Physician: Gilles Chiquito, MD  Discharge Diagnosis: 1. Orthostatic Hypotension  Discharge Medications: Allergies as of 06/25/2017   No Known Allergies     Medication List    STOP taking these medications   acetaminophen 325 MG tablet Commonly known as:  TYLENOL   carvedilol 12.5 MG tablet Commonly known as:  COREG   diphenhydrAMINE 25 MG tablet Commonly known as:  BENADRYL   furosemide 80 MG tablet Commonly known as:  LASIX   isosorbide-hydrALAZINE 20-37.5 MG tablet Commonly known as:  BIDIL   traZODone 50 MG tablet Commonly known as:  DESYREL     TAKE these medications   atorvastatin 10 MG tablet Commonly known as:  LIPITOR Take 1 tablet (10 mg total) by mouth at bedtime.   citalopram 40 MG tablet Commonly known as:  CELEXA Take 20 mg by mouth daily.   escitalopram 10 MG tablet Commonly known as:  LEXAPRO Take 10 mg by mouth daily.   hydrocortisone cream 1 % Apply 1 application topically at bedtime as needed for itching.   insulin aspart 100 UNIT/ML FlexPen Commonly known as:  NOVOLOG FLEXPEN Inject 4 Units into the skin 3 (three) times daily with meals. What changed:    how much to take  when to take this   Insulin Glargine 100 UNIT/ML Solostar Pen Commonly known as:  LANTUS SOLOSTAR Inject 20 Units into the skin at bedtime.   levETIRAcetam 500 MG tablet Commonly known as:  KEPPRA Take 1 tablet (500 mg total) by mouth daily.   loratadine 10 MG tablet Commonly known as:  CLARITIN Take 10 mg by mouth daily.   Rivaroxaban 15 MG Tabs tablet Commonly known as:  XARELTO Take 15 mg by mouth at bedtime.       Disposition and follow-up:   Kyle Newton was discharged from [facility] in stable condition.  At the hospital follow up visit please address:  1.  Orthostatic hypotension: Please review what medications he is currently taking and assess for continued symptoms of dizziness or weakness. His Coreg, Furosemide and Bidil were held and will be important to reassess if these medications can be continued.  Volume status: His home lasix was held during admission and at discharge due to orthostatic hypotension. He was euvolemic at time of discharge. Please evaluate volume status and appropriateness of resuming this.   2.  Labs / imaging needed at time of follow-up: none  3.  Pending labs/ test needing follow-up: none  Hospital Course by problem list:  Orthostatic Hypotension Patient presented 06/23/17 for evaluation of dizziness and syncope. He was in his usual state of health until day of admission when he experienced dizziness with standing and fell-forward hitting face on grass. Denied changes since his recent discharge, illness, changes in medication and noted he does have access to his medications but cannot say for sure whether or not he might have taken extra of his medications. Positive orthostatic vitals on admission which improved with 500cc bolus. Home lasix, carvedilol and Bidil were held during admission with improvement of symptoms. Orthostatic hypotension resolved and patient was able to ambulate without dizziness. He was discharged home with instructions to continue holding lasix, carvedilol, bidil and follow-up closely with PCP. Message sent to [facility] front desk staff for 1x hospital follow-up visit, if patient not able to see PCP in 1 week.  CKD Hyperkalemia Baseline Cr difficult to ascertain as patient sees the [place] and we were unable to view these records. Cr stable around 2, which seemed to be his baseline during prior admissions. He did have hyperkalemia at 5.7 on admission which was treated with albuterol and insulin.   Scabies Patient continued to have itching and what appeared to be continued scabies lesions and was treated with  Ivermectin 6/2.   Discharge Vitals:   BP 121/90 (BP Location: Left Arm)   Pulse 63   Temp 98.4 F (36.9 C) (Oral)   Resp 16   Ht 5\' 9"  (1.753 m)   Wt 164 lb 11.2 oz (74.7 kg)   SpO2 99%   BMI 24.32 kg/m   Pertinent Labs, Studies, and Procedures:  CT head: no acute pathology  Signed: Lastacia Solum, DO 06/29/2017, 4:29 PM   Pager: [phone]

## 2018-11-02 ENCOUNTER — Other Ambulatory Visit: Payer: Self-pay

## 2018-11-02 DIAGNOSIS — Z20822 Contact with and (suspected) exposure to covid-19: Secondary | ICD-10-CM

## 2018-11-04 LAB — NOVEL CORONAVIRUS, NAA: SARS-CoV-2, NAA: NOT DETECTED

## 2019-03-21 ENCOUNTER — Other Ambulatory Visit: Payer: Self-pay

## 2019-03-21 DIAGNOSIS — Z20822 Contact with and (suspected) exposure to covid-19: Secondary | ICD-10-CM

## 2019-03-22 LAB — NOVEL CORONAVIRUS, NAA: SARS-CoV-2, NAA: NOT DETECTED

## 2019-04-26 ENCOUNTER — Emergency Department (HOSPITAL_COMMUNITY): Payer: Medicare Other

## 2019-04-26 ENCOUNTER — Inpatient Hospital Stay (HOSPITAL_COMMUNITY)
Admission: EM | Admit: 2019-04-26 | Discharge: 2019-05-01 | DRG: 291 | Disposition: A | Discharge status: Home Health | Payer: Medicare Other | Attending: Internal Medicine | Admitting: Internal Medicine

## 2019-04-26 ENCOUNTER — Other Ambulatory Visit: Payer: Self-pay

## 2019-04-26 DIAGNOSIS — F17210 Nicotine dependence, cigarettes, uncomplicated: Secondary | ICD-10-CM | POA: Diagnosis present

## 2019-04-26 DIAGNOSIS — I248 Other forms of acute ischemic heart disease: Secondary | ICD-10-CM | POA: Diagnosis present

## 2019-04-26 DIAGNOSIS — Z9114 Patient's other noncompliance with medication regimen: Secondary | ICD-10-CM

## 2019-04-26 DIAGNOSIS — I5033 Acute on chronic diastolic (congestive) heart failure: Secondary | ICD-10-CM | POA: Diagnosis not present

## 2019-04-26 DIAGNOSIS — Z23 Encounter for immunization: Secondary | ICD-10-CM

## 2019-04-26 DIAGNOSIS — I4819 Other persistent atrial fibrillation: Secondary | ICD-10-CM | POA: Diagnosis not present

## 2019-04-26 DIAGNOSIS — I480 Paroxysmal atrial fibrillation: Secondary | ICD-10-CM | POA: Diagnosis present

## 2019-04-26 DIAGNOSIS — Z638 Other specified problems related to primary support group: Secondary | ICD-10-CM | POA: Diagnosis not present

## 2019-04-26 DIAGNOSIS — Z951 Presence of aortocoronary bypass graft: Secondary | ICD-10-CM | POA: Diagnosis not present

## 2019-04-26 DIAGNOSIS — Z20822 Contact with and (suspected) exposure to covid-19: Secondary | ICD-10-CM | POA: Diagnosis present

## 2019-04-26 DIAGNOSIS — Z953 Presence of xenogenic heart valve: Secondary | ICD-10-CM

## 2019-04-26 DIAGNOSIS — N1831 Chronic kidney disease, stage 3a: Secondary | ICD-10-CM | POA: Diagnosis present

## 2019-04-26 DIAGNOSIS — Z7901 Long term (current) use of anticoagulants: Secondary | ICD-10-CM

## 2019-04-26 DIAGNOSIS — Z79899 Other long term (current) drug therapy: Secondary | ICD-10-CM

## 2019-04-26 DIAGNOSIS — I130 Hypertensive heart and chronic kidney disease with heart failure and stage 1 through stage 4 chronic kidney disease, or unspecified chronic kidney disease: Principal | ICD-10-CM | POA: Diagnosis present

## 2019-04-26 DIAGNOSIS — Z87898 Personal history of other specified conditions: Secondary | ICD-10-CM

## 2019-04-26 DIAGNOSIS — E118 Type 2 diabetes mellitus with unspecified complications: Secondary | ICD-10-CM | POA: Diagnosis present

## 2019-04-26 DIAGNOSIS — I482 Chronic atrial fibrillation, unspecified: Secondary | ICD-10-CM | POA: Diagnosis present

## 2019-04-26 DIAGNOSIS — Z952 Presence of prosthetic heart valve: Secondary | ICD-10-CM | POA: Diagnosis not present

## 2019-04-26 DIAGNOSIS — I5043 Acute on chronic combined systolic (congestive) and diastolic (congestive) heart failure: Secondary | ICD-10-CM | POA: Diagnosis present

## 2019-04-26 DIAGNOSIS — Z95810 Presence of automatic (implantable) cardiac defibrillator: Secondary | ICD-10-CM | POA: Diagnosis not present

## 2019-04-26 DIAGNOSIS — B86 Scabies: Secondary | ICD-10-CM | POA: Diagnosis present

## 2019-04-26 DIAGNOSIS — N179 Acute kidney failure, unspecified: Secondary | ICD-10-CM

## 2019-04-26 DIAGNOSIS — Z794 Long term (current) use of insulin: Secondary | ICD-10-CM

## 2019-04-26 DIAGNOSIS — J189 Pneumonia, unspecified organism: Secondary | ICD-10-CM | POA: Diagnosis not present

## 2019-04-26 DIAGNOSIS — R7989 Other specified abnormal findings of blood chemistry: Secondary | ICD-10-CM | POA: Diagnosis present

## 2019-04-26 DIAGNOSIS — R778 Other specified abnormalities of plasma proteins: Secondary | ICD-10-CM | POA: Diagnosis present

## 2019-04-26 DIAGNOSIS — I509 Heart failure, unspecified: Secondary | ICD-10-CM

## 2019-04-26 DIAGNOSIS — I4891 Unspecified atrial fibrillation: Secondary | ICD-10-CM | POA: Diagnosis present

## 2019-04-26 DIAGNOSIS — I4892 Unspecified atrial flutter: Secondary | ICD-10-CM | POA: Diagnosis present

## 2019-04-26 DIAGNOSIS — I5023 Acute on chronic systolic (congestive) heart failure: Secondary | ICD-10-CM | POA: Diagnosis not present

## 2019-04-26 DIAGNOSIS — R062 Wheezing: Secondary | ICD-10-CM | POA: Diagnosis not present

## 2019-04-26 DIAGNOSIS — E1122 Type 2 diabetes mellitus with diabetic chronic kidney disease: Secondary | ICD-10-CM | POA: Diagnosis present

## 2019-04-26 LAB — MAGNESIUM: Magnesium: 1.8 mg/dL (ref 1.7–2.4)

## 2019-04-26 LAB — BASIC METABOLIC PANEL
Anion gap: 12 (ref 5–15)
BUN: 32 mg/dL — ABNORMAL HIGH (ref 8–23)
CO2: 20 mmol/L — ABNORMAL LOW (ref 22–32)
Calcium: 9.2 mg/dL (ref 8.9–10.3)
Chloride: 107 mmol/L (ref 98–111)
Creatinine, Ser: 3.04 mg/dL — ABNORMAL HIGH (ref 0.61–1.24)
GFR calc Af Amer: 24 mL/min — ABNORMAL LOW (ref >60)
GFR calc non Af Amer: 21 mL/min — ABNORMAL LOW (ref >60)
Glucose, Bld: 103 mg/dL — ABNORMAL HIGH (ref 70–99)
Potassium: 4.9 mmol/L (ref 3.5–5.1)
Sodium: 139 mmol/L (ref 135–145)

## 2019-04-26 LAB — PROTIME-INR
INR: 1.4 — ABNORMAL HIGH (ref 0.8–1.2)
Prothrombin Time: 17.1 seconds — ABNORMAL HIGH (ref 11.4–15.2)

## 2019-04-26 LAB — CBC WITH DIFFERENTIAL/PLATELET
Abs Immature Granulocytes: 0.02 10*3/uL (ref 0.00–0.07)
Basophils Absolute: 0.1 10*3/uL (ref 0.0–0.1)
Basophils Relative: 1 %
Eosinophils Absolute: 0.1 10*3/uL (ref 0.0–0.5)
Eosinophils Relative: 2 %
HCT: 43.9 % (ref 39.0–52.0)
Hemoglobin: 13.6 g/dL (ref 13.0–17.0)
Immature Granulocytes: 0 %
Lymphocytes Relative: 14 %
Lymphs Abs: 0.8 10*3/uL (ref 0.7–4.0)
MCH: 28.2 pg (ref 26.0–34.0)
MCHC: 31 g/dL (ref 30.0–36.0)
MCV: 90.9 fL (ref 80.0–100.0)
Monocytes Absolute: 0.7 10*3/uL (ref 0.1–1.0)
Monocytes Relative: 12 %
Neutro Abs: 4.1 10*3/uL (ref 1.7–7.7)
Neutrophils Relative %: 71 %
Platelets: 175 10*3/uL (ref 150–400)
RBC: 4.83 MIL/uL (ref 4.22–5.81)
RDW: 15.8 % — ABNORMAL HIGH (ref 11.5–15.5)
WBC: 5.8 10*3/uL (ref 4.0–10.5)
nRBC: 0 % (ref 0.0–0.2)

## 2019-04-26 LAB — HEPARIN LEVEL (UNFRACTIONATED): Heparin Unfractionated: 0.72 IU/mL — ABNORMAL HIGH (ref 0.30–0.70)

## 2019-04-26 LAB — BRAIN NATRIURETIC PEPTIDE: B Natriuretic Peptide: 780.7 pg/mL — ABNORMAL HIGH (ref 0.0–100.0)

## 2019-04-26 LAB — TROPONIN I (HIGH SENSITIVITY)
Troponin I (High Sensitivity): 73 ng/L — ABNORMAL HIGH (ref <18)
Troponin I (High Sensitivity): 71 ng/L — ABNORMAL HIGH (ref <18)

## 2019-04-26 LAB — MRSA PCR SCREENING: MRSA by PCR: NEGATIVE

## 2019-04-26 LAB — PROCALCITONIN: Procalcitonin: <0.1 ng/mL

## 2019-04-26 LAB — TSH: TSH: 2.723 u[IU]/mL (ref 0.350–4.500)

## 2019-04-26 LAB — GLUCOSE, CAPILLARY: Glucose-Capillary: 145 mg/dL — ABNORMAL HIGH (ref 70–99)

## 2019-04-26 LAB — APTT: aPTT: 177 seconds (ref 24–36)

## 2019-04-26 LAB — SARS CORONAVIRUS 2 (TAT 6-24 HRS): SARS Coronavirus 2: NEGATIVE

## 2019-04-26 MED ORDER — HYDRALAZINE HCL 20 MG/ML IJ SOLN
5.0000 mg | INTRAMUSCULAR | Status: DC | PRN
  Administered 2019-04-26: 23:00:00 5 mg via INTRAVENOUS
  Filled 2019-04-26: qty 1

## 2019-04-26 MED ORDER — SODIUM CHLORIDE 0.9% FLUSH
3.0000 mL | Freq: Two times a day (BID) | INTRAVENOUS | Status: DC
  Administered 2019-04-26 – 2019-05-01 (×10): 3 mL via INTRAVENOUS

## 2019-04-26 MED ORDER — INSULIN ASPART 100 UNIT/ML ~~LOC~~ SOLN
0.0000 [IU] | Freq: Three times a day (TID) | SUBCUTANEOUS | Status: DC
  Administered 2019-04-28 – 2019-05-01 (×4): 1 [IU] via SUBCUTANEOUS

## 2019-04-26 MED ORDER — ONDANSETRON HCL 4 MG/2ML IJ SOLN: 4.0000 mg | Freq: Four times a day (QID) | INTRAMUSCULAR | Status: DC | PRN

## 2019-04-26 MED ORDER — SODIUM CHLORIDE 0.9 % IV SOLN
1.0000 g | Freq: Once | INTRAVENOUS | Status: AC
  Administered 2019-04-26: 18:00:00 1 g via INTRAVENOUS
  Filled 2019-04-26: qty 10

## 2019-04-26 MED ORDER — NICOTINE 21 MG/24HR TD PT24
21.0000 mg | MEDICATED_PATCH | Freq: Every day | TRANSDERMAL | Status: DC
  Administered 2019-04-26 – 2019-05-01 (×6): 21 mg via TRANSDERMAL
  Filled 2019-04-26 (×6): qty 1

## 2019-04-26 MED ORDER — FUROSEMIDE 10 MG/ML IJ SOLN
40.0000 mg | Freq: Two times a day (BID) | INTRAMUSCULAR | Status: DC
  Administered 2019-04-26 – 2019-04-30 (×8): 40 mg via INTRAVENOUS
  Filled 2019-04-26 (×8): qty 4

## 2019-04-26 MED ORDER — GUAIFENESIN ER 600 MG PO TB12
600.0000 mg | ORAL_TABLET | Freq: Two times a day (BID) | ORAL | Status: DC
  Administered 2019-04-26 – 2019-05-01 (×10): 600 mg via ORAL
  Filled 2019-04-26 (×10): qty 1

## 2019-04-26 MED ORDER — MAGNESIUM OXIDE 400 (241.3 MG) MG PO TABS
800.0000 mg | ORAL_TABLET | Freq: Once | ORAL | Status: AC
  Administered 2019-04-26: 800 mg via ORAL
  Filled 2019-04-26: qty 2

## 2019-04-26 MED ORDER — SODIUM CHLORIDE 0.9% FLUSH: 3.0000 mL | INTRAVENOUS | Status: DC | PRN

## 2019-04-26 MED ORDER — DOXYCYCLINE HYCLATE 100 MG PO TABS
100.0000 mg | ORAL_TABLET | Freq: Two times a day (BID) | ORAL | Status: DC
  Administered 2019-04-26 – 2019-04-27 (×2): 100 mg via ORAL
  Filled 2019-04-26 (×2): qty 1

## 2019-04-26 MED ORDER — HEPARIN BOLUS VIA INFUSION
5000.0000 [IU] | Freq: Once | INTRAVENOUS | Status: AC
  Administered 2019-04-26: 18:00:00 5000 [IU] via INTRAVENOUS
  Filled 2019-04-26: qty 5000

## 2019-04-26 MED ORDER — HEPARIN SODIUM (PORCINE) 5000 UNIT/ML IJ SOLN: 5000.0000 [IU] | Freq: Three times a day (TID) | INTRAMUSCULAR | Status: DC

## 2019-04-26 MED ORDER — FUROSEMIDE 10 MG/ML IJ SOLN
40.0000 mg | Freq: Once | INTRAMUSCULAR | Status: AC
  Administered 2019-04-26: 15:00:00 40 mg via INTRAVENOUS
  Filled 2019-04-26: qty 4

## 2019-04-26 MED ORDER — PERMETHRIN 5 % EX CREA
TOPICAL_CREAM | Freq: Once | CUTANEOUS | Status: AC
  Filled 2019-04-26: qty 60

## 2019-04-26 MED ORDER — ACETAMINOPHEN 325 MG PO TABS
650.0000 mg | ORAL_TABLET | ORAL | Status: DC | PRN
  Administered 2019-04-27 – 2019-04-29 (×2): 650 mg via ORAL
  Filled 2019-04-26 (×2): qty 2

## 2019-04-26 MED ORDER — HEPARIN (PORCINE) 25000 UT/250ML-% IV SOLN
1550.0000 [IU]/h | INTRAVENOUS | Status: DC
  Administered 2019-04-26: 1400 [IU]/h via INTRAVENOUS
  Filled 2019-04-26 (×2): qty 250

## 2019-04-26 MED ORDER — SODIUM CHLORIDE 0.9 % IV SOLN
250.0000 mL | INTRAVENOUS | Status: DC | PRN
  Administered 2019-04-26: 250 mL via INTRAVENOUS

## 2019-04-26 NOTE — ED Provider Notes (Signed)
[facility] EMERGENCY DEPARTMENT Provider Note   CSN: [id number] Arrival date & time: 04/26/19  1315     History Chief Complaint  Patient presents with  . Shortness of Breath    Kyle Newton is a 64 y.o. male.  Patient is a 64 year old gentleman with past medical history of CHF, diabetes, hypertension presenting to the emergency department for shortness of breath.  Reports worsening symptoms over the last month associated with increased leg swelling, orthopnea and dyspnea on exertion.  Denies any fever, chills.  He reports occasional chest pain but none today.  He reports that about 1 year ago he any and all this medication and has not seen a primary care doctor.  Reports he does not like to take his medication.        Past Medical History:  Diagnosis Date  . CHF (congestive heart failure) ([place])   . Diabetes mellitus without complication ([place])   . Hypertension     Patient Active Problem List   Diagnosis Date Noted  . Acute exacerbation of CHF (congestive heart failure) ([place]) 04/26/2019  . 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 History of seizures 04/26/2019  . Diabetes mellitus type 2 with complications ([place]) 96/29/5284  . Community acquired pneumonia 04/26/2019  . Acute renal failure superimposed on stage 3a chronic kidney disease ([place]) 04/26/2019  . Atrial fibrillation ([place]) 04/26/2019  . Elevated troponin 04/26/2019  . Hyperkalemia   . Hypotension 06/23/2017  . Scabies 06/21/2017  . Hyperglycemia 06/17/2017    Past Surgical History:  Procedure Laterality Date  . CARDIAC VALVE REPLACEMENT         Family History  Problem Relation Age of Onset  . Heart failure Mother   . Diabetes Mother   . Diabetes Father     Social History   Tobacco Use  . Smoking status: Current Every Day Smoker    Packs/day: 1.00    Years: 44.00    Pack years: 44.00    Types: Cigarettes  . Smokeless tobacco: Never Used  Substance  Use Topics  . Alcohol use: Not Currently  . Drug use: Not Currently    Types: Marijuana, Cocaine    Home Medications Prior to Admission medications   Medication Sig Start Date End Date Taking? Authorizing Provider  apixaban (ELIQUIS) 5 MG TABS tablet Take 1 tablet (5 mg total) by mouth 2 (two) times daily. 05/01/19 07/30/19  [place] Donnamarie Poag, DO  atorvastatin (LIPITOR) 10 MG tablet Take 1 tablet (10 mg total) by mouth daily. 05/01/19 04/30/20  [place] Donnamarie Poag, DO  carvedilol (COREG) 3.125 MG tablet Take 1 tablet (3.125 mg total) by mouth 2 (two) times daily with a meal. 05/01/19 07/30/19  [place] Donnamarie Poag, DO  furosemide (LASIX) 40 MG tablet Take 1 tablet (40 mg total) by mouth 2 (two) times daily. 05/01/19 07/30/19  [place] Donnamarie Poag, DO  glipiZIDE (GLUCOTROL) 5 MG tablet Take 1 tablet (5 mg total) by mouth daily before breakfast. 05/01/19 07/30/19  [place] Donnamarie Poag, DO  hydrALAZINE (APRESOLINE) 25 MG tablet Take 1 tablet (25 mg total) by mouth every 8 (eight) hours. 05/01/19 07/30/19  [place] Donnamarie Poag, DO  isosorbide mononitrate (IMDUR) 30 MG 24 hr tablet Take 1 tablet (30 mg total) by mouth daily. 05/02/19 07/31/19  [place] Donnamarie Poag, DO  Semaglutide,0.25 or 0.5MG /DOS, 2 MG/1.5ML SOPN Inject 0.5 mg into the skin once a week.    [provider]    Allergies    Patient has no  known allergies.  Review of Systems   Review of Systems  Constitutional: Negative for appetite change, chills and fever.  HENT: Negative.  Negative for ear pain and sore throat.   Eyes: Negative for pain and visual disturbance.  Respiratory: Positive for cough and shortness of breath.   Cardiovascular: Positive for chest pain and leg swelling. Negative for palpitations.  Gastrointestinal: Negative for abdominal pain, nausea and vomiting.  Genitourinary: Negative for dysuria and hematuria.  Musculoskeletal: Negative for arthralgias and back pain.  Skin: Positive for rash. Negative for color change.  Neurological: Negative for dizziness,  seizures and syncope.  All other systems reviewed and are negative.   Physical Exam Updated Vital Signs BP (!) 138/95   Pulse 62   Temp 97.7 F (36.5 C) (Oral)   Resp 20   Ht 5\' 9"  (1.753 m)   Wt 80.5 kg   SpO2 99%   BMI 26.21 kg/m   Physical Exam Vitals and nursing note reviewed.  Constitutional:      Appearance: Normal appearance.  HENT:     Head: Normocephalic.  Eyes:     Conjunctiva/sclera: Conjunctivae normal.     Pupils: Pupils are equal, round, and reactive to light.  Cardiovascular:     Rate and Rhythm: Normal rate and regular rhythm.  Pulmonary:     Effort: Pulmonary effort is normal.     Breath sounds: Wheezing and rales present.  Chest:     Chest wall: No mass or deformity.  Abdominal:     Palpations: Abdomen is soft.  Musculoskeletal:     Right lower leg: Edema present.     Left lower leg: Edema present.     Comments: Significant BLE pitting edema up to the belly  Skin:    General: Skin is dry.     Findings: Rash (scabies appearing rash with old excoriations) present.  Neurological:     Mental Status: He is alert.  Psychiatric:        Mood and Affect: Mood normal.     ED Results / Procedures / Treatments   Labs (all labs ordered are listed, but only abnormal results are displayed) Labs Reviewed  BASIC METABOLIC PANEL - Abnormal; Notable for the following components:      Result Value   CO2 20 (*)    Glucose, Bld 103 (*)    BUN 32 (*)    Creatinine, Ser 3.04 (*)    GFR calc non Af Amer 21 (*)    GFR calc Af Amer 24 (*)    All other components within normal limits  CBC WITH DIFFERENTIAL/PLATELET - Abnormal; Notable for the following components:   RDW 15.8 (*)    All other components within normal limits  BRAIN NATRIURETIC PEPTIDE - Abnormal; Notable for the following components:   B Natriuretic Peptide 780.7 (*)    All other components within normal limits  PROTIME-INR - Abnormal; Notable for the following components:   Prothrombin Time  17.1 (*)    INR 1.4 (*)    All other components within normal limits  APTT - Abnormal; Notable for the following components:   aPTT 177 (*)    All other components within normal limits  HEPARIN LEVEL (UNFRACTIONATED) - Abnormal; Notable for the following components:   Heparin Unfractionated 0.72 (*)    All other components within normal limits  BASIC METABOLIC PANEL - Abnormal; Notable for the following components:   Glucose, Bld 202 (*)    BUN 38 (*)  Creatinine, Ser 2.97 (*)    GFR calc non Af Amer 21 (*)    GFR calc Af Amer 25 (*)    All other components within normal limits  PROTIME-INR - Abnormal; Notable for the following components:   Prothrombin Time 16.3 (*)    INR 1.3 (*)    All other components within normal limits  HEPARIN LEVEL (UNFRACTIONATED) - Abnormal; Notable for the following components:   Heparin Unfractionated 0.13 (*)    All other components within normal limits  APTT - Abnormal; Notable for the following components:   aPTT 59 (*)    All other components within normal limits  CBC - Abnormal; Notable for the following components:   RDW 15.9 (*)    All other components within normal limits  HEMOGLOBIN A1C - Abnormal; Notable for the following components:   Hgb A1c MFr Bld 8.8 (*)    All other components within normal limits  GLUCOSE, CAPILLARY - Abnormal; Notable for the following components:   Glucose-Capillary 145 (*)    All other components within normal limits  HEPARIN LEVEL (UNFRACTIONATED) - Abnormal; Notable for the following components:   Heparin Unfractionated 0.28 (*)    All other components within normal limits  URINALYSIS, ROUTINE W REFLEX MICROSCOPIC - Abnormal; Notable for the following components:   Hgb urine dipstick SMALL (*)    Protein, ur 100 (*)    Bacteria, UA RARE (*)    All other components within normal limits  GLUCOSE, CAPILLARY - Abnormal; Notable for the following components:   Glucose-Capillary 109 (*)    All other  components within normal limits  BASIC METABOLIC PANEL - Abnormal; Notable for the following components:   Glucose, Bld 125 (*)    BUN 40 (*)    Creatinine, Ser 2.62 (*)    Calcium 8.7 (*)    GFR calc non Af Amer 25 (*)    GFR calc Af Amer 29 (*)    All other components within normal limits  CBC - Abnormal; Notable for the following components:   RDW 15.8 (*)    All other components within normal limits  GLUCOSE, CAPILLARY - Abnormal; Notable for the following components:   Glucose-Capillary 178 (*)    All other components within normal limits  GLUCOSE, CAPILLARY - Abnormal; Notable for the following components:   Glucose-Capillary 164 (*)    All other components within normal limits  GLUCOSE, CAPILLARY - Abnormal; Notable for the following components:   Glucose-Capillary 117 (*)    All other components within normal limits  BASIC METABOLIC PANEL - Abnormal; Notable for the following components:   Glucose, Bld 114 (*)    BUN 41 (*)    Creatinine, Ser 2.51 (*)    Calcium 8.7 (*)    GFR calc non Af Amer 26 (*)    GFR calc Af Amer 30 (*)    All other components within normal limits  CBC - Abnormal; Notable for the following components:   WBC 3.6 (*)    RDW 15.7 (*)    All other components within normal limits  GLUCOSE, CAPILLARY - Abnormal; Notable for the following components:   Glucose-Capillary 236 (*)    All other components within normal limits  GLUCOSE, CAPILLARY - Abnormal; Notable for the following components:   Glucose-Capillary 105 (*)    All other components within normal limits  GLUCOSE, CAPILLARY - Abnormal; Notable for the following components:   Glucose-Capillary 181 (*)    All other components  within normal limits  GLUCOSE, CAPILLARY - Abnormal; Notable for the following components:   Glucose-Capillary 194 (*)    All other components within normal limits  GLUCOSE, CAPILLARY - Abnormal; Notable for the following components:   Glucose-Capillary 105 (*)    All  other components within normal limits  BASIC METABOLIC PANEL - Abnormal; Notable for the following components:   Glucose, Bld 118 (*)    BUN 46 (*)    Creatinine, Ser 2.38 (*)    Calcium 8.7 (*)    GFR calc non Af Amer 28 (*)    GFR calc Af Amer 32 (*)    All other components within normal limits  CBC - Abnormal; Notable for the following components:   WBC 3.6 (*)    RDW 15.9 (*)    All other components within normal limits  GLUCOSE, CAPILLARY - Abnormal; Notable for the following components:   Glucose-Capillary 226 (*)    All other components within normal limits  GLUCOSE, CAPILLARY - Abnormal; Notable for the following components:   Glucose-Capillary 112 (*)    All other components within normal limits  GLUCOSE, CAPILLARY - Abnormal; Notable for the following components:   Glucose-Capillary 179 (*)    All other components within normal limits  GLUCOSE, CAPILLARY - Abnormal; Notable for the following components:   Glucose-Capillary 153 (*)    All other components within normal limits  BASIC METABOLIC PANEL - Abnormal; Notable for the following components:   Glucose, Bld 162 (*)    BUN 47 (*)    Creatinine, Ser 2.30 (*)    Calcium 8.7 (*)    GFR calc non Af Amer 29 (*)    GFR calc Af Amer 34 (*)    All other components within normal limits  CBC - Abnormal; Notable for the following components:   WBC 3.6 (*)    RDW 15.9 (*)    Platelets 149 (*)    All other components within normal limits  GLUCOSE, CAPILLARY - Abnormal; Notable for the following components:   Glucose-Capillary 158 (*)    All other components within normal limits  GLUCOSE, CAPILLARY - Abnormal; Notable for the following components:   Glucose-Capillary 156 (*)    All other components within normal limits  GLUCOSE, CAPILLARY - Abnormal; Notable for the following components:   Glucose-Capillary 130 (*)    All other components within normal limits  TROPONIN I (HIGH SENSITIVITY) - Abnormal; Notable for the  following components:   Troponin I (High Sensitivity) 73 (*)    All other components within normal limits  TROPONIN I (HIGH SENSITIVITY) - Abnormal; Notable for the following components:   Troponin I (High Sensitivity) 71 (*)    All other components within normal limits  SARS CORONAVIRUS 2 (TAT 6-24 HRS)  MRSA PCR SCREENING  CREATININE, URINE, RANDOM  SODIUM, URINE, RANDOM  MAGNESIUM  PROCALCITONIN  TSH  HIV ANTIBODY (ROUTINE TESTING W REFLEX)  GLUCOSE, CAPILLARY  GLUCOSE, CAPILLARY    EKG EKG Interpretation  Date/Time:  Friday April 26 2019 13:57:30 EDT Ventricular Rate:  60 PR Interval:    QRS Duration: 170 QT Interval:  543 QTC Calculation: 543 R Axis:   -87 Text Interpretation: Atrial flutter with predominant 4:1 AV block Nonspecific IVCD with LAD LVH with secondary repolarization abnormality Confirmed by Dene Gentry [phone]) on 04/26/2019 3:02:34 PM   Radiology No results found.  Procedures Procedures (including critical care time)  Medications Ordered in ED Medications  furosemide (LASIX) injection 40 mg (  40 mg Intravenous Given 04/26/19 1514)  cefTRIAXone (ROCEPHIN) 1 g in sodium chloride 0.9 % 100 mL IVPB (1 g Intravenous New Bag/Given 04/26/19 1817)  permethrin (ELIMITE) 5 % cream ( Topical Given 04/26/19 1831)  heparin bolus via infusion 5,000 Units (5,000 Units Intravenous Bolus from Bag 04/26/19 1829)  magnesium oxide (MAG-OX) tablet 800 mg (800 mg Oral Given 04/26/19 1831)  influenza vac split quadrivalent PF (FLUARIX) injection 0.5 mL (0.5 mLs Intramuscular Given 04/28/19 1018)  potassium chloride SA (KLOR-CON) CR tablet 40 mEq (40 mEq Oral Given 04/27/19 1045)  pneumococcal 23 valent vaccine (PNEUMOVAX-23) injection 0.5 mL (0.5 mLs Intramuscular Given 05/01/19 3903)    ED Course  I have reviewed the triage vital signs and the nursing notes.  Pertinent labs & imaging results that were available during my care of the patient were reviewed by me and considered in my  medical decision making (see chart for details).  Clinical Course as of May 03 2033  Fri Apr 26, 2019  1602 Patient with hx diabets, HTN, CHF who self discontinued all medications 1 year ago presenting with SOB, significant LE edema, orthopnea. Found on workup with have CHF exaceration based on physical exam and BNP over 700. Also with RLL pneumonia and AKI. Previous creatinine in the 1.5s and is 3.04 today. Patient afebrile and stable. Given 40IV lasix and rocephin for CHF and CAP. Consult pending for admission   [KM]    Clinical Course User Index [KM] Kristine Royal   MDM Rules/Calculators/A&P                      The patient appears reasonably stabilized for admission considering the current resources, flow, and capabilities available in the ED at this time, and I doubt any other [facility] requiring further screening and/or treatment in the ED prior to admission.  Final Clinical Impression(s) / ED Diagnoses Final diagnoses:  Community acquired pneumonia of right lower lobe of lung  Acute on chronic congestive heart failure, unspecified heart failure type ([place])  AKI (acute kidney injury) ([place])    Rx / DC Orders ED Discharge Orders         Ordered    isosorbide mononitrate (IMDUR) 30 MG 24 hr tablet  Daily     05/01/19 1128    carvedilol (COREG) 3.125 MG tablet  2 times daily with meals     05/01/19 1128    furosemide (LASIX) 40 MG tablet  2 times daily     05/01/19 1128    hydrALAZINE (APRESOLINE) 25 MG tablet  Every 8 hours     05/01/19 1128    apixaban (ELIQUIS) 5 MG TABS tablet  2 times daily,   Status:  Discontinued     05/01/19 1128    atorvastatin (LIPITOR) 10 MG tablet  Daily     05/01/19 1128    Increase activity slowly     05/01/19 1128    Diet - low sodium heart healthy     05/01/19 1128    Call MD for:  temperature >100.4     05/01/19 1128    Call MD for:  persistant nausea and vomiting     05/01/19 1128    Call MD for:  severe uncontrolled pain      05/01/19 1128    Call MD for:  difficulty breathing, headache or visual disturbances     05/01/19 1128    Call MD for:  persistant dizziness or light-headedness  05/01/19 1128    Call MD for:  extreme fatigue     05/01/19 1128    (HEART FAILURE PATIENTS) Call MD:  Anytime you have any of the following symptoms: 1) 3 pound weight gain in 24 hours or 5 pounds in 1 week 2) shortness of breath, with or without a dry hacking cough 3) swelling in the hands, feet or stomach 4) if you have to sleep on extra pillows at night in order to breathe.     05/01/19 1128    glipiZIDE (GLUCOTROL) 5 MG tablet  Daily before breakfast     05/01/19 1128    apixaban (ELIQUIS) 5 MG TABS tablet  2 times daily     05/01/19 1259           Kristine Royal 05/04/19 2035    Valarie Merino, MD 05/07/19 [phone]

## 2019-04-26 NOTE — ED Notes (Signed)
Pt returned from XR at this time. Placed pt back on monitor, pt placed in position of comfort and advised of wait status.

## 2019-04-26 NOTE — ED Triage Notes (Signed)
Pt presents from home for SOB, lower limb edema, difficulty taking deep breath, abd distentio, orthopnea x1 week. Denies CP, fever, body aches. H/o CHF, has not been seen by MD since last hospitalization last year, does not take prescribed daily meds "that make me pee."   EMS exam : diminished bases, paced rhythm

## 2019-04-26 NOTE — ED Notes (Signed)
Pt transported to XR via stretcher at this time.   

## 2019-04-26 NOTE — Progress Notes (Signed)
Patient attempted to transfer self to bathroom. Educated patient on importance of calling before getting up. Bed alarm is on. Will continue to monitor.

## 2019-04-26 NOTE — H&P (Addendum)
History and Physical    Kyle Newton GYF:[id number] DOB: Jun 05, 1955 DOA: 04/26/2019  Referring MD/NP/PA: Carmelia Bake, PA-C PCP: Patient, No Pcp Per  Patient coming from: Home  Chief Complaint: Shortness of breath  I have personally briefly reviewed patient's old medical records in [place]   HPI: Kyle Newton is a 64 y.o. male with medical history significant of HTN, DM type II, systolic CHF last EF 91-63%, s/p ICD,  s/p aortic valve replacement, tobacco abuse, and seizure disorder presents with complaints of progressively worsening shortness of breath. Symptoms have been present for over a week. He complains of having associated symptoms of lower extremity swelling, abdominal distention, orthopnea, productive cough, itching, and rash. He has not been on any medications since last hospitalized back in 2019. Denies having any seizures, fever, nausea, vomiting, or diarrhea symptoms. He has had chest pains before, but not recently.  ED Course: Upon admission into the emergency department patient had been seen to be follow-up blood pressures elevated up to 169/111. Labs significant for BUN 32, creatinine 3.04, BNP 780.7, troponin 73. Chest x-ray significant for cardiomegaly with right-sided pleural effusion and right basilar infiltrate. Patient was given 40 mg of Lasix IV and started on Rocephin.  Review of Systems  Constitutional: Positive for malaise/fatigue.  HENT: Negative for ear pain and nosebleeds.   Eyes: Negative for pain and discharge.  Respiratory: Positive for cough, sputum production and shortness of breath.   Cardiovascular: Positive for chest pain, orthopnea and leg swelling.  Gastrointestinal: Negative for diarrhea, nausea and vomiting.  Genitourinary: Negative for dysuria and hematuria.  Musculoskeletal: Negative for falls.  Skin: Positive for itching and rash.  Neurological: Negative for speech change and seizures.  Psychiatric/Behavioral: Positive for substance  abuse. Negative for memory loss.    Past Medical History:  Diagnosis Date  . CHF (congestive heart failure) ([place])   . Diabetes mellitus without complication ([place])   . Hypertension     Past Surgical History:  Procedure Laterality Date  . CARDIAC VALVE REPLACEMENT       reports that he has been smoking cigarettes. He has a 44.00 pack-year smoking history. He has never used smokeless tobacco. He reports previous alcohol use. He reports previous drug use. Drugs: Marijuana and Cocaine.  No Known Allergies  Family History  Problem Relation Age of Onset  . Heart failure Mother   . Diabetes Mother   . Diabetes Father     Prior to Admission medications   Medication Sig Start Date End Date Taking? Authorizing Provider  atorvastatin (LIPITOR) 10 MG tablet Take 10 mg by mouth daily.   Yes [provider]  citalopram (CELEXA) 40 MG tablet Take 20 mg by mouth daily.   Yes [provider]  escitalopram (LEXAPRO) 10 MG tablet Take 10 mg by mouth daily.   Yes [provider]  hydrocortisone cream 1 % Apply 1 application topically at bedtime as needed for itching.   Yes [provider]  insulin aspart (NOVOLOG FLEXPEN) 100 UNIT/ML FlexPen Inject 4 Units into the skin 3 (three) times daily with meals. 06/21/17  Yes Chundi, Vahini, MD  insulin glargine (LANTUS) 100 UNIT/ML Solostar Pen Inject 20 Units into the skin at bedtime.   Yes [provider]  levETIRAcetam (KEPPRA) 500 MG tablet Take 500 mg by mouth daily.   Yes [provider]  loratadine (CLARITIN) 10 MG tablet Take 10 mg by mouth daily.   Yes [provider]  Rivaroxaban (XARELTO) 15 MG TABS tablet  Take 15 mg by mouth at bedtime.   Yes [provider]  atorvastatin (LIPITOR) 10 MG tablet Take 1 tablet (10 mg total) by mouth at bedtime. 06/21/17 07/21/17  Lars Mage, MD  Insulin Glargine (LANTUS SOLOSTAR) 100 UNIT/ML Solostar Pen Inject 20 Units into the skin at  bedtime. 06/21/17 07/21/17  Lars Mage, MD  levETIRAcetam (KEPPRA) 500 MG tablet Take 1 tablet (500 mg total) by mouth daily. 06/21/17 07/21/17  Lars Mage, MD    Physical Exam:  Constitutional: NAD, calm, comfortable Vitals:   04/26/19 1325 04/26/19 1400  BP: (!) 169/111 (!) 157/104  Pulse: 65 60  Resp: 20 12  Temp: 98.8 F (37.1 C)   TempSrc: Oral   SpO2: 100% 97%   Eyes: PERRL, lids and conjunctivae normal ENMT: Mucous membranes are moist. Posterior pharynx clear of any exudate or lesions.  Neck: normal, supple, no masses, no thyromegaly. JVD present. Respiratory: Coarse breath sounds with positive crackles heard in the mid to lower lung field. O2 saturation maintained on room air. Cardiovascular: Irregular rhythm. No murmurs / rubs / gallops. +2 pitting lower extremity edema to thighs. 2+ pedal pulses. No carotid bruits.  Abdomen: no tenderness, no masses palpated. No hepatosplenomegaly. Bowel sounds positive.  Musculoskeletal: no clubbing / cyanosis. No joint deformity upper and lower extremities. Good ROM, no contractures. Normal muscle tone.  Skin: no rashes, lesions, ulcers. No induration Neurologic: CN 2-12 grossly intact. Sensation intact, DTR normal. Strength 5/5 in all 4.  Psychiatric: Normal judgment and insight. Alert and oriented x 3. Normal mood.     Labs on Admission: I have personally reviewed following labs and imaging studies  CBC: Recent Labs  Lab 04/26/19 1414  WBC 5.8  NEUTROABS 4.1  HGB 13.6  HCT 43.9  MCV 90.9  PLT 254   Basic Metabolic Panel: Recent Labs  Lab 04/26/19 1414  NA 139  K 4.9  CL 107  CO2 20*  GLUCOSE 103*  BUN 32*  CREATININE 3.04*  CALCIUM 9.2   GFR: CrCl cannot be calculated (Unknown ideal weight.). Liver Function Tests: No results for input(s): AST, ALT, ALKPHOS, BILITOT, PROT, ALBUMIN in the last 168 hours. No results for input(s): LIPASE, AMYLASE in the last 168 hours. No results for input(s): AMMONIA in the  last 168 hours. Coagulation Profile: No results for input(s): INR, PROTIME in the last 168 hours. Cardiac Enzymes: No results for input(s): CKTOTAL, CKMB, CKMBINDEX, TROPONINI in the last 168 hours. BNP (last 3 results) No results for input(s): PROBNP in the last 8760 hours. HbA1C: No results for input(s): HGBA1C in the last 72 hours. CBG: No results for input(s): GLUCAP in the last 168 hours. Lipid Profile: No results for input(s): CHOL, HDL, LDLCALC, TRIG, CHOLHDL, LDLDIRECT in the last 72 hours. Thyroid Function Tests: No results for input(s): TSH, T4TOTAL, FREET4, T3FREE, THYROIDAB in the last 72 hours. Anemia Panel: No results for input(s): VITAMINB12, FOLATE, FERRITIN, TIBC, IRON, RETICCTPCT in the last 72 hours. Urine analysis:    Component Value Date/Time   COLORURINE STRAW (A) 06/23/2017 [place] 06/23/2017 1208   LABSPEC 1.007 06/23/2017 1208   PHURINE 5.0 06/23/2017 1208   GLUCOSEU >=500 (A) 06/23/2017 1208   HGBUR SMALL (A) 06/23/2017 [place] 06/23/2017 [place] 06/23/2017 1208   PROTEINUR 30 (A) 06/23/2017 1208   NITRITE NEGATIVE 06/23/2017 1208   LEUKOCYTESUR NEGATIVE 06/23/2017 1208   Sepsis Labs: No results found for this or any previous visit (  from the past 240 hour(s)).   Radiological Exams on Admission: DG Chest 2 View  Result Date: 04/26/2019 CLINICAL DATA:  Shortness of breath. EXAM: CHEST - 2 VIEW COMPARISON:  06/17/2017. FINDINGS: Prior CABG. Cardiac pacer with lead tip over the right atrium and right ventricle. Prior cardiac valve replacement. Cardiomegaly. No pulmonary venous congestion. Right base infiltrate. Moderate right pleural effusion. No pneumothorax. IMPRESSION: 1. Prior CABG. Cardiac pacer with lead tip over the right atrium right ventricle. Cardiomegaly. No pulmonary venous congestion. 2.  Right base infiltrate.  Moderate right pleural effusion. Electronically Signed   By: Marcello Moores  Register    On: 04/26/2019 14:23    EKG: Independently reviewed. Atrial flutter at 69 bpm with PVCs QTC 543  Assessment/Plan Systolic congestive heart failure exacerbation: Acute. Patient presents with complaints of shortness of breath along with lower extremity and abdominal swelling. Chest x-ray noted cardiomegaly with moderate right-sided pleural effusion and right base infiltrate. BNP elevated at 780.7 -Admit to a telemetry bed -Heart failure orders set  initiated  -Continuous pulse oximetry with nasal cannula oxygen as needed to keep O2 saturations >92% -Strict I&Os and daily weights -Check TSH -Elevate lower extremities -Lasix 40 mg IV Bid -Reassess in a.m. and adjust diuresis as needed. -Check echocardiogram -No ACE-I due to renal insufficiency and no beta-blocker due to acute decompensation  Community-acquired pneumonia:  Acute. Patient reports having a productive cough. Chest x-ray noting signs of right lower lobe pneumonia. -Check procalcitonin -Continue Rocephin and doxycycline -Mucinex   Acute renal failure superimposed on chronic kidney disease stage III: Patient presents with creatinine elevated up to 3.04 with BUN 32.  Creatinine previously noted to be around 1.57 back in 2019. Suspect secondary to poor cardiac output -Check urinalysis  -Continue to monitor kidney function with diuresis  Elevated troponin: Acute. High-sensitivity troponins mildly elevated 73->71. Patient without complaints of chest pain at this time. Suspect this is secondary to demand in the setting of fluid overload and CKD.  Atrial fibrillation: Patient has been off of Xarelto. Appears currently rate controlled. -Heparin per pharmacy  -Consider restarting patient on Xarelto if he agrees  Status post AICD, history of aortic valve replacement: Thought to be in [place] fear [facility] in 2006 or 2007. -ICD to be interrogated  Diabetes mellitus type 2: On admission glucose 103. Last hemoglobin A1c was 16.3 in  05/2017.  -Hypoglycemic protocols -Check hemoglobin A1c -CBGs before every meal with very sensitive SSI -Adjust insulin regimen as needed  Scabies: Present on admission. Patient on multiple scabs and lesions is all body. -Contact precautions -Permethrin cream  Essential hypertension: Blood pressures elevated up to 169/111. -Hydralazine IV as needed  History of seizures: Patient reports that he has been off of Keppra since 2019. Previously taking Keppra 500 mg daily. -Consider appropriate to restart  Tobacco abuse: Patient admits to smoking a pack cigarettes per day on average. -Counseled the patient on the need cessation of tobacco use. -Nicotine patch offered  DVT prophylaxis: Heparin Code Status: Full Family Communication: Attempted to call patient's wife at the number provided [phone] with 3 attempts without any answer Disposition Plan: To be determined Consults called: Cards Admission status: Inpatient  Norval Morton MD [facility] Pager [phone]   If 7PM-7AM, please contact night-coverage [email or web address] Password [facility]  04/26/2019, 4:06 PM

## 2019-04-26 NOTE — Progress Notes (Signed)
ANTICOAGULATION CONSULT NOTE - Initial Consult  Pharmacy Consult for Heparin Indication: atrial fibrillation  No Known Allergies  Patient Measurements:   Heparin Dosing Weight: 98.4 kg  Vital Signs: Temp: 98.8 F (37.1 C) (04/02 1325) Temp Source: Oral (04/02 1325) BP: 170/112 (04/02 1600) Pulse Rate: 62 (04/02 1600)  Labs: Recent Labs    04/26/19 1414  HGB 13.6  HCT 43.9  PLT 175  CREATININE 3.04*  TROPONINIHS 73*    CrCl cannot be calculated (Unknown ideal weight.).   Medical History: Past Medical History:  Diagnosis Date  . CHF (congestive heart failure) ([place])   . Diabetes mellitus without complication ([place])   . Hypertension     Assessment: 64 yo M with hx of atrial fibrillation with no AC PTA presents with SOB. Pt supposed to be on Xarelto but states he has not been taking the medication. [facility]17[facility]19. CBC wnl. No overt bleeding noted. Pharmacy consulted to start heparin.  Goal of Therapy:  Heparin level 0.3-0.7 units/ml Monitor platelets by anticoagulation protocol: Yes   Plan:  - Heparin 5000 unit bolus x1 - Start Heparin IV gtt at 1400 units/hr - Check baseline at 8-hr HL and aPTT - Daily CBC and HL and aPTT until levels correlate - Monitor for signs/symptoms of bleeding  Richardine Service, PharmD PGY1 Pharmacy Resident Phone: [phone] 04/26/2019  5:46 PM  Please check [email or web address] for unit-specific pharmacy phone numbers.

## 2019-04-26 NOTE — Progress Notes (Signed)
CRITICAL VALUE STICKER  CRITICAL VALUE: APTT 177  RECEIVER (on-site recipient of call): Shanae Luo RN  DATE & TIME NOTIFIED: 2021  MESSENGER (representative from lab):  MD NOTIFIED: [place]  TIME OF NOTIFICATION: 2027  RESPONSE: Pending

## 2019-04-27 ENCOUNTER — Inpatient Hospital Stay (HOSPITAL_COMMUNITY): Payer: Medicare Other

## 2019-04-27 DIAGNOSIS — I5033 Acute on chronic diastolic (congestive) heart failure: Secondary | ICD-10-CM

## 2019-04-27 DIAGNOSIS — I509 Heart failure, unspecified: Secondary | ICD-10-CM

## 2019-04-27 LAB — PROTIME-INR
INR: 1.3 — ABNORMAL HIGH (ref 0.8–1.2)
Prothrombin Time: 16.3 seconds — ABNORMAL HIGH (ref 11.4–15.2)

## 2019-04-27 LAB — CBC
HCT: 44.4 % (ref 39.0–52.0)
Hemoglobin: 14.1 g/dL (ref 13.0–17.0)
MCH: 28 pg (ref 26.0–34.0)
MCHC: 31.8 g/dL (ref 30.0–36.0)
MCV: 88.3 fL (ref 80.0–100.0)
Platelets: 171 10*3/uL (ref 150–400)
RBC: 5.03 MIL/uL (ref 4.22–5.81)
RDW: 15.9 % — ABNORMAL HIGH (ref 11.5–15.5)
WBC: 5 10*3/uL (ref 4.0–10.5)
nRBC: 0 % (ref 0.0–0.2)

## 2019-04-27 LAB — URINALYSIS, ROUTINE W REFLEX MICROSCOPIC
Bilirubin Urine: NEGATIVE
Glucose, UA: NEGATIVE mg/dL
Ketones, ur: NEGATIVE mg/dL
Leukocytes,Ua: NEGATIVE
Nitrite: NEGATIVE
Protein, ur: 100 mg/dL — AB
Specific Gravity, Urine: 1.009 (ref 1.005–1.030)
pH: 5 (ref 5.0–8.0)

## 2019-04-27 LAB — BASIC METABOLIC PANEL
Anion gap: 11 (ref 5–15)
BUN: 38 mg/dL — ABNORMAL HIGH (ref 8–23)
CO2: 22 mmol/L (ref 22–32)
Calcium: 9 mg/dL (ref 8.9–10.3)
Chloride: 105 mmol/L (ref 98–111)
Creatinine, Ser: 2.97 mg/dL — ABNORMAL HIGH (ref 0.61–1.24)
GFR calc Af Amer: 25 mL/min — ABNORMAL LOW (ref >60)
GFR calc non Af Amer: 21 mL/min — ABNORMAL LOW (ref >60)
Glucose, Bld: 202 mg/dL — ABNORMAL HIGH (ref 70–99)
Potassium: 4 mmol/L (ref 3.5–5.1)
Sodium: 138 mmol/L (ref 135–145)

## 2019-04-27 LAB — GLUCOSE, CAPILLARY
Glucose-Capillary: 91 mg/dL (ref 70–99)
Glucose-Capillary: 109 mg/dL — ABNORMAL HIGH (ref 70–99)
Glucose-Capillary: 178 mg/dL — ABNORMAL HIGH (ref 70–99)

## 2019-04-27 LAB — APTT: aPTT: 59 seconds — ABNORMAL HIGH (ref 24–36)

## 2019-04-27 LAB — HEMOGLOBIN A1C
Hgb A1c MFr Bld: 8.8 % — ABNORMAL HIGH (ref 4.8–5.6)
Mean Plasma Glucose: 205.86 mg/dL

## 2019-04-27 LAB — ECHOCARDIOGRAM COMPLETE
Height: 69 in
Weight: 3249.6 oz

## 2019-04-27 LAB — HIV ANTIBODY (ROUTINE TESTING W REFLEX): HIV Screen 4th Generation wRfx: NONREACTIVE

## 2019-04-27 LAB — CREATININE, URINE, RANDOM: Creatinine, Urine: 33.1 mg/dL

## 2019-04-27 LAB — HEPARIN LEVEL (UNFRACTIONATED)
Heparin Unfractionated: 0.13 IU/mL — ABNORMAL LOW (ref 0.30–0.70)
Heparin Unfractionated: 0.28 IU/mL — ABNORMAL LOW (ref 0.30–0.70)

## 2019-04-27 LAB — SODIUM, URINE, RANDOM: Sodium, Ur: 125 mmol/L

## 2019-04-27 MED ORDER — APIXABAN 5 MG PO TABS
5.0000 mg | ORAL_TABLET | Freq: Two times a day (BID) | ORAL | Status: DC
  Administered 2019-04-27 – 2019-05-01 (×9): 5 mg via ORAL
  Filled 2019-04-27 (×9): qty 1

## 2019-04-27 MED ORDER — POTASSIUM CHLORIDE CRYS ER 20 MEQ PO TBCR
40.0000 meq | EXTENDED_RELEASE_TABLET | Freq: Once | ORAL | Status: AC
  Administered 2019-04-27: 11:00:00 40 meq via ORAL
  Filled 2019-04-27: qty 2

## 2019-04-27 MED ORDER — INFLUENZA VAC SPLIT QUAD 0.5 ML IM SUSY
0.5000 mL | PREFILLED_SYRINGE | INTRAMUSCULAR | Status: AC
  Administered 2019-04-28: 10:00:00 0.5 mL via INTRAMUSCULAR
  Filled 2019-04-27: qty 0.5

## 2019-04-27 NOTE — Progress Notes (Signed)
ANTICOAGULATION CONSULT NOTE  Pharmacy Consult for Heparin Indication: atrial fibrillation  No Known Allergies  Patient Measurements: Height: 5\' 9"  (175.3 cm) Weight: 91.9 kg (202 lb 8 oz) IBW/kg (Calculated) : 70.7 Heparin Dosing Weight: 98.4 kg  Vital Signs: Temp: 98.4 F (36.9 C) (04/03 0000) Temp Source: Oral (04/03 0000) BP: 148/97 (04/03 0000) Pulse Rate: 59 (04/03 0000)  Labs: Recent Labs    04/26/19 1414 04/26/19 1620 04/26/19 1835 04/27/19 0215  HGB 13.6  --   --   --   HCT 43.9  --   --   --   PLT 175  --   --   --   APTT  --   --  177*  --   LABPROT  --   --  17.1*  --   INR  --   --  1.4*  --   HEPARINUNFRC  --   --  0.72* 0.13*  CREATININE 3.04*  --   --   --   TROPONINIHS 73* 71*  --   --     Estimated Creatinine Clearance: 27.5 mL/min (A) (by C-G formula based on SCr of 3.04 mg/dL (H)).   Medical History: Past Medical History:  Diagnosis Date  . CHF (congestive heart failure) ([place])   . Diabetes mellitus without complication ([place])   . Hypertension     Assessment: 64 yo M with hx of atrial fibrillation with no AC PTA presents with SOB. Pt supposed to be on Xarelto but states he has not been taking the medication. [facility] 30-day supply on 03/17/19. CBC wnl. No overt bleeding noted. Pharmacy consulted to start heparin.  Initial heparin level low at 0.13. RN reports having issues with occlusion of IV line earlier and ultimately lost IV access at 2025 with drip resumed at 2308. Will continue at same rate for now and re-time heparin level for 8 hours after drip resumed. aPTT does not appear accurate - will d/c and follow heparin levels only. CBC wnl. No bleeding issues per discussion with RN.  Goal of Therapy:  Heparin level 0.3-0.7 units/ml Monitor platelets by anticoagulation protocol: Yes   Plan:  Continue heparin IV at 1400 units/hr for now F/u 8-hr HL from when drip was restarted earlier Monitor daily heparin level and  CBC, s/sx bleeding   Arturo Morton, PharmD, BCPS Please check AMION for all [place] contact numbers Clinical Pharmacist 04/27/2019 3:07 AM

## 2019-04-27 NOTE — Progress Notes (Signed)
  Echocardiogram 2D Echocardiogram has been performed.  Quintyn Dombek G Trinidad Ingle 04/27/2019, 11:06 AM

## 2019-04-27 NOTE — Progress Notes (Signed)
  Echocardiogram 2D Echocardiogram has been attempted. Patient receiving personal care. Will reattempt at later time.  Randa Lynn Rhonda Linan 04/27/2019, 9:17 AM

## 2019-04-27 NOTE — Progress Notes (Addendum)
PROGRESS NOTE    Kyle Newton  IRC:[id number] DOB: 1955-11-09 DOA: 04/26/2019 PCP: Patient, No Pcp Per  Brief Narrative: 64 year old noncompliant male with history of chronic systolic CHF, type 2 diabetes mellitus, history of aortic valve replacement, ICD, remote history of seizure disorder, has not seen a doctor or taken any of his cardiac medications for well over a year presented to the ED with progressive dyspnea on exertion, orthopnea and lower extremity edema for 1 week. -Work-up in the emergency room noted elevated blood pressure, creatinine of 3.0, BNP of 780, chest x-ray with cardiomegaly, pleural effusion, questionable right basilar infiltrate.  Assessment & Plan:   Acute on chronic systolic congestive heart failure  History of AVR -Noncompliance with all cardiac meds for over 1 year -Continue IV Lasix today -Check 2D echocardiogram, prior ECHo in 2019 with EF of 45% -TSH is unremarkable -Monitor I's/O, daily weights, BMP in a.m.  Questionable right lower lobe infiltrate -Clinically I think this is all CHF related, he is afebrile white count is normal and procalcitonin is less than 0.1 -Discontinue antibiotics and monitor  Acute renal failure superimposed on chronic kidney disease stage III:  -Patient presents with creatinine elevated up to 3.04 with BUN 32.  Creatinine previously noted to be around 1.57 back in 2019.  -Recent baseline unknown -Monitor with diuresis -Check urinalysis for proteinuria  Elevated troponin: High-sensitivity troponins mildly elevated 73->71, suspect this is secondary to demand in the setting of CHF and CKD, No symptoms of ACS, monitor  Paroxysmal atrial fibrillation: Patient has been off of Xarelto. Appears currently rate controlled. -Transition to Eliquis given renal insufficiency  Status post AICD, history of aortic valve replacement: Thought to be in [place] [facility] in 2006 or 2007.  Diabetes mellitus type 2:  - Last  hemoglobin A1c was 16.3 in 05/2017.  -Hemoglobin A1c is 8.8, CBG stable now continue sliding scale insulin -Depending on where kidney function stabilizes will decide regarding long-term insulin  Recent scabies: . Patient on multiple scabs and lesions is all body. -Previously treated for same, I do not see active lesions now, evidence of a lot of postinflammatory hyperpigmentation seen -Continue contact precautions and permethrin cream   Essential hypertension: Blood pressures elevated up to 169/111.-anticipate improvement with diuresis -FU echo -Hydralazine IV as needed  History of seizures: Patient reports that he has been off of [place] since 2019. Previously taking Keppra 500 mg daily. -No history of seizures recently, has not taken Keppra in 1 to 2 years -Will defer this to PCP  Tobacco abuse: Patient admits to smoking a pack cigarettes per day on average. -Counseled the patient on the need cessation of tobacco use. -Nicotine patch offered  DVT prophylaxis: Heparin Code Status: Full Family Communication:  Admitting MD attempted to call patient's wife at the number provided [phone] with 3 attempts without any answer, will attempt later today Disposition Plan:  Home pending improvement in volume status, CHF     Procedures:   Antimicrobials:    Subjective: -Starting to feel little better, breathing improving overall Objective: Vitals:   04/26/19 2300 04/27/19 0000 04/27/19 0400 04/27/19 0450  BP: (!) 151/93 (!) 148/97 (!) 131/104   Pulse: (!) 59 (!) 59 60   Resp: 16 19 19    Temp:  98.4 F (36.9 C) 97.7 F (36.5 C) 97.7 F (36.5 C)  TempSrc:  Oral Oral Oral  SpO2: 99% 100% 100%   Weight:   92.1 kg   Height:  Intake/Output Summary (Last 24 hours) at 04/27/2019 1225 Last data filed at 04/27/2019 0900 Gross per 24 hour  Intake 922.24 ml  Output 1250 ml  Net -327.76 ml   Filed Weights   04/26/19 1815 04/27/19 0400  Weight: 91.9 kg 92.1 kg     Examination:  General exam: Pleasant male appears much older than stated age, AAOx3 Respiratory system: Few basilar rales, otherwise clear Cardiovascular system: S1 & S2 heard, RRR.  Click heard  gastrointestinal system: Abdomen is nondistended, soft and nontender.Normal bowel sounds heard. Central nervous system: Alert and oriented. No focal neurological deficits. Extremities: 1+ edema Skin: Extensive hyperpigmented lesions noted throughout his arms and legs especially abdominal wall Psychiatry: Judgement and insight appear normal. Mood & affect appropriate.     Data Reviewed:   CBC: Recent Labs  Lab 04/26/19 1414 04/27/19 0215  WBC 5.8 5.0  NEUTROABS 4.1  --   HGB 13.6 14.1  HCT 43.9 44.4  MCV 90.9 88.3  PLT 175 676   Basic Metabolic Panel: Recent Labs  Lab 04/26/19 1414 04/26/19 1620 04/27/19 0215  NA 139  --  138  K 4.9  --  4.0  CL 107  --  105  CO2 20*  --  22  GLUCOSE 103*  --  202*  BUN 32*  --  38*  CREATININE 3.04*  --  2.97*  CALCIUM 9.2  --  9.0  MG  --  1.8  --    GFR: Estimated Creatinine Clearance: 28.2 mL/min (A) (by C-G formula based on SCr of 2.97 mg/dL (H)). Liver Function Tests: No results for input(s): AST, ALT, ALKPHOS, BILITOT, PROT, ALBUMIN in the last 168 hours. No results for input(s): LIPASE, AMYLASE in the last 168 hours. No results for input(s): AMMONIA in the last 168 hours. Coagulation Profile: Recent Labs  Lab 04/26/19 1835 04/27/19 0215  INR 1.4* 1.3*   Cardiac Enzymes: No results for input(s): CKTOTAL, CKMB, CKMBINDEX, TROPONINI in the last 168 hours. BNP (last 3 results) No results for input(s): PROBNP in the last 8760 hours. HbA1C: Recent Labs    04/27/19 0215  HGBA1C 8.8*   CBG: Recent Labs  Lab 04/26/19 2134 04/27/19 0613  GLUCAP 145* 91   Lipid Profile: No results for input(s): CHOL, HDL, LDLCALC, TRIG, CHOLHDL, LDLDIRECT in the last 72 hours. Thyroid Function Tests: Recent Labs    04/26/19 1900   TSH 2.723   Anemia Panel: No results for input(s): VITAMINB12, FOLATE, FERRITIN, TIBC, IRON, RETICCTPCT in the last 72 hours. Urine analysis:    Component Value Date/Time   COLORURINE STRAW (A) 06/23/2017 [place] 06/23/2017 1208   LABSPEC 1.007 06/23/2017 1208   PHURINE 5.0 06/23/2017 1208   GLUCOSEU >=500 (A) 06/23/2017 1208   HGBUR SMALL (A) 06/23/2017 [place] 06/23/2017 [place] 06/23/2017 1208   PROTEINUR 30 (A) 06/23/2017 1208   NITRITE NEGATIVE 06/23/2017 1208   LEUKOCYTESUR NEGATIVE 06/23/2017 1208   Sepsis Labs: @LABRCNTIP (procalcitonin:4,lacticidven:4)  ) Recent Results (from the past 240 hour(s))  SARS CORONAVIRUS 2 (TAT 6-24 HRS) Nasopharyngeal Nasopharyngeal Swab     Status: None   Collection Time: 04/26/19  2:53 PM   Specimen: Nasopharyngeal Swab  Result Value Ref Range Status   SARS Coronavirus 2 NEGATIVE NEGATIVE Final    Comment: (NOTE) SARS-CoV-2 target nucleic acids are NOT DETECTED. The SARS-CoV-2 RNA is generally detectable in upper and lower respiratory specimens during the acute phase of infection. Negative results do  not preclude SARS-CoV-2 infection, do not rule out co-infections with other pathogens, and should not be used as the sole basis for treatment or other patient management decisions. Negative results must be combined with clinical observations, patient history, and epidemiological information. The expected result is Negative. Fact Sheet for Patients: SugarRoll.be Fact Sheet for Healthcare Providers: https://www.woods-mathews.com/ This test is not yet approved or cleared by the [place] FDA and  has been authorized for detection and/or diagnosis of SARS-CoV-2 by FDA under an Emergency Use Authorization (EUA). This EUA will remain  in effect (meaning this test can be used) for the duration of the COVID-19 declaration under Section 56  4(b)(1) of the Act, 21 U.S.C. section 360bbb-3(b)(1), unless the authorization is terminated or revoked sooner. Performed at [facility], [place] [address], [place], [place] [address]   MRSA PCR Screening     Status: None   Collection Time: 04/26/19  6:55 PM   Specimen: Nasopharyngeal  Result Value Ref Range Status   MRSA by PCR NEGATIVE NEGATIVE Final    Comment:        The GeneXpert MRSA Assay (FDA approved for NASAL specimens only), is one component of a comprehensive MRSA colonization surveillance program. It is not intended to diagnose MRSA infection nor to guide or monitor treatment for MRSA infections. Performed at [facility], [place] [address], [place],  [address]      Radiology Studies: DG Chest 2 View  Result Date: 04/26/2019 CLINICAL DATA:  Shortness of breath. EXAM: CHEST - 2 VIEW COMPARISON:  06/17/2017. FINDINGS: Prior CABG. Cardiac pacer with lead tip over the right atrium and right ventricle. Prior cardiac valve replacement. Cardiomegaly. No pulmonary venous congestion. Right base infiltrate. Moderate right pleural effusion. No pneumothorax. IMPRESSION: 1. Prior CABG. Cardiac pacer with lead tip over the right atrium right ventricle. Cardiomegaly. No pulmonary venous congestion. 2.  Right base infiltrate.  Moderate right pleural effusion. Electronically Signed   By: Marcello Moores  Register   On: 04/26/2019 14:23    Scheduled Meds: . apixaban  5 mg Oral BID  . doxycycline  100 mg Oral Q12H  . furosemide  40 mg Intravenous BID  . guaiFENesin  600 mg Oral BID  . [START ON 04/28/2019] influenza vac split quadrivalent PF  0.5 mL Intramuscular Tomorrow-1000  . insulin aspart  0-6 Units Subcutaneous TID WC  . nicotine  21 mg Transdermal Daily  . sodium chloride flush  3 mL Intravenous Q12H   Continuous Infusions: . sodium chloride 250 mL (04/26/19 1816)     LOS: 1 day    Time spent: 85min  Domenic Polite, MD [facility] 04/27/2019, 12:25 PM

## 2019-04-27 NOTE — Discharge Instructions (Signed)

## 2019-04-27 NOTE — Plan of Care (Signed)
  Problem: Education: Goal: Knowledge of General Education information will improve Description: Including pain rating scale, medication(s)/side effects and non-pharmacologic comfort measures Outcome: Progressing   Problem: Clinical Measurements: Goal: Ability to maintain clinical measurements within normal limits will improve Outcome: Progressing Goal: Respiratory complications will improve Outcome: Progressing Goal: Cardiovascular complication will be avoided Outcome: Progressing   Problem: Activity: Goal: Risk for activity intolerance will decrease Outcome: Progressing   Problem: Elimination: Goal: Will not experience complications related to bowel motility Outcome: Progressing

## 2019-04-27 NOTE — Progress Notes (Addendum)
[place] for Heparin >> Eliquis Indication: atrial fibrillation  No Known Allergies  Patient Measurements: Height: 5\' 9"  (175.3 cm) Weight: 92.1 kg (203 lb 1.6 oz) IBW/kg (Calculated) : 70.7 Heparin Dosing Weight: 89.4 kg  Vital Signs: Temp: 97.7 F (36.5 C) (04/03 0450) Temp Source: Oral (04/03 0450) BP: 131/104 (04/03 0400) Pulse Rate: 60 (04/03 0400)  Labs: Recent Labs    04/26/19 1414 04/26/19 1620 04/26/19 1835 04/27/19 0215 04/27/19 0727  HGB 13.6  --   --  14.1  --   HCT 43.9  --   --  44.4  --   PLT 175  --   --  171  --   APTT  --   --  177* 59*  --   LABPROT  --   --  17.1* 16.3*  --   INR  --   --  1.4* 1.3*  --   HEPARINUNFRC  --   --  0.72* 0.13* 0.28*  CREATININE 3.04*  --   --  2.97*  --   TROPONINIHS 73* 71*  --   --   --     Estimated Creatinine Clearance: 28.2 mL/min (A) (by C-G formula based on SCr of 2.97 mg/dL (H)).   Medical History: Past Medical History:  Diagnosis Date  . CHF (congestive heart failure) ([place])   . Diabetes mellitus without complication ([place])   . Hypertension     Assessment: 64 yo M with hx of atrial fibrillation with no AC PTA presents with SOB. Pt supposed to be on Xarelto but states he has not been taking the medication. [facility] on [facility]/17/19. Pharmacy consulted to start heparin.  Heparin level (0.28) remains low on drip rate 1400 units/hr. CBC remains stable. Scr still elevated at 2.97 (BL ~1.6). Confirmed with RN that heparin was not turned off overnight and did not have any further line/access issues. No overt bleeding noted.  Goal of Therapy:  Heparin level 0.3-0.7 units/ml Monitor platelets by anticoagulation protocol: Yes   Plan:  Increase IV heparin gtt to 1550 units/hr Check 8-hr HL  Daily HL, CBC, s/sx bleeding  Richardine Service, PharmD PGY1 Pharmacy Resident Phone: [phone] 04/27/2019  8:02 AM  Please check [email or web address] for  unit-specific pharmacy phone numbers.  ========================================= 0900 Update: Switching Heparin to Eliquis 5mg  BID (>60 kg, <46 years of age). Start Eliquis at the time of heparin discontinuation.

## 2019-04-28 DIAGNOSIS — E118 Type 2 diabetes mellitus with unspecified complications: Secondary | ICD-10-CM

## 2019-04-28 DIAGNOSIS — N179 Acute kidney failure, unspecified: Secondary | ICD-10-CM

## 2019-04-28 DIAGNOSIS — R778 Other specified abnormalities of plasma proteins: Secondary | ICD-10-CM

## 2019-04-28 DIAGNOSIS — Z95810 Presence of automatic (implantable) cardiac defibrillator: Secondary | ICD-10-CM

## 2019-04-28 DIAGNOSIS — I4819 Other persistent atrial fibrillation: Secondary | ICD-10-CM

## 2019-04-28 DIAGNOSIS — N1831 Chronic kidney disease, stage 3a: Secondary | ICD-10-CM

## 2019-04-28 LAB — BASIC METABOLIC PANEL
Anion gap: 11 (ref 5–15)
BUN: 40 mg/dL — ABNORMAL HIGH (ref 8–23)
CO2: 22 mmol/L (ref 22–32)
Calcium: 8.7 mg/dL — ABNORMAL LOW (ref 8.9–10.3)
Chloride: 105 mmol/L (ref 98–111)
Creatinine, Ser: 2.62 mg/dL — ABNORMAL HIGH (ref 0.61–1.24)
GFR calc Af Amer: 29 mL/min — ABNORMAL LOW (ref >60)
GFR calc non Af Amer: 25 mL/min — ABNORMAL LOW (ref >60)
Glucose, Bld: 125 mg/dL — ABNORMAL HIGH (ref 70–99)
Potassium: 4.2 mmol/L (ref 3.5–5.1)
Sodium: 138 mmol/L (ref 135–145)

## 2019-04-28 LAB — CBC
HCT: 41.2 % (ref 39.0–52.0)
Hemoglobin: 13.1 g/dL (ref 13.0–17.0)
MCH: 27.9 pg (ref 26.0–34.0)
MCHC: 31.8 g/dL (ref 30.0–36.0)
MCV: 87.8 fL (ref 80.0–100.0)
Platelets: 157 10*3/uL (ref 150–400)
RBC: 4.69 MIL/uL (ref 4.22–5.81)
RDW: 15.8 % — ABNORMAL HIGH (ref 11.5–15.5)
WBC: 4.6 10*3/uL (ref 4.0–10.5)
nRBC: 0 % (ref 0.0–0.2)

## 2019-04-28 LAB — GLUCOSE, CAPILLARY
Glucose-Capillary: 98 mg/dL (ref 70–99)
Glucose-Capillary: 164 mg/dL — ABNORMAL HIGH (ref 70–99)
Glucose-Capillary: 117 mg/dL — ABNORMAL HIGH (ref 70–99)
Glucose-Capillary: 236 mg/dL — ABNORMAL HIGH (ref 70–99)

## 2019-04-28 MED ORDER — ISOSORBIDE MONONITRATE ER 30 MG PO TB24
30.0000 mg | ORAL_TABLET | Freq: Every day | ORAL | Status: DC
  Administered 2019-04-28 – 2019-05-01 (×4): 30 mg via ORAL
  Filled 2019-04-28 (×4): qty 1

## 2019-04-28 MED ORDER — PNEUMOCOCCAL VAC POLYVALENT 25 MCG/0.5ML IJ INJ
0.5000 mL | INJECTION | INTRAMUSCULAR | Status: AC
  Administered 2019-05-01: 08:00:00 0.5 mL via INTRAMUSCULAR
  Filled 2019-04-28: qty 0.5

## 2019-04-28 MED ORDER — HYDRALAZINE HCL 25 MG PO TABS
25.0000 mg | ORAL_TABLET | Freq: Three times a day (TID) | ORAL | Status: DC
  Administered 2019-04-28 – 2019-05-01 (×9): 25 mg via ORAL
  Filled 2019-04-28 (×9): qty 1

## 2019-04-28 NOTE — Progress Notes (Signed)
MEWS score 2 due to respirations. Patient was repositioning in bed, the exertion from which caused an increase into the low 20s. Manually verified respirations when patient became settled into bed at 15, congruous with the value obtained by the bedside monitor. Will continue to monitor.

## 2019-04-28 NOTE — Consult Note (Addendum)
CONSULTATION NOTE   Patient Name: Kyle Newton Date of Encounter: 04/28/2019 Cardiologist: No primary care provider on file.  Chief Complaint   Short of breath  Patient Profile   64 yo male with history of systolic CHF, admitted with acute on chronic systolic CHF and reduced LVEF on echo, prior AICD, history of diabetes and hypertension  HPI   Kyle Newton is a 64 y.o. male who is being seen today for the evaluation of CHF at the request of Dr. Broadus John. This is a pleasant 64 year old male with a past medical history significant for congestive heart failure with previous LVEF of 45 to 50% by echo in June 2019 and history of bioprosthetic aortic valve replacement.  He appears to be a new patient to cardiology in our area.  He had previously had care through the [facility] and [place] and that his cardiologist was in [place].  Those records were not immediately available.  He now presents with shortness of breath, lower extremity edema, abdominal distention orthopnea and productive cough.  He reports he has not been taking any medication since he was hospitalized in 2019.  Personally reviewed an echocardiogram performed yesterday which shows an acute decline in LVEF to 25 to 30% with global hypokinesis and septal, distal anteroseptal and apical dyskinesis.  An AICD is noted.  There is mild RV dysfunction valve is bioprosthetic with a mean gradient of 14 mmHg.  Moderate biatrial enlargement is noted.  Pertinent studies on admission show that blood pressure was elevated 169/111, creatinine was 3.04, BNP was 780 and troponin elevated at 73.  X-ray showed cardiomegaly with right-sided pleural effusion.  He was given Lasix with good response and is about 1.3 L negative.  Creatinine has improved and now is 2.62 today.  He does report some improvement in breathing.  PMHx   Past Medical History:  Diagnosis Date  . CHF (congestive heart failure) ([place])   . Diabetes mellitus without  complication ([place])   . Hypertension     Past Surgical History:  Procedure Laterality Date  . CARDIAC VALVE REPLACEMENT      FAMHx   Family History  Problem Relation Age of Onset  . Heart failure Mother   . Diabetes Mother   . Diabetes Father     SOCHx    reports that he has been smoking cigarettes. He has a 44.00 pack-year smoking history. He has never used smokeless tobacco. He reports previous alcohol use. He reports previous drug use. Drugs: Marijuana and Cocaine.  Outpatient Medications   No current facility-administered medications on file prior to encounter.   Current Outpatient Medications on File Prior to Encounter  Medication Sig Dispense Refill  . atorvastatin (LIPITOR) 10 MG tablet Take 1 tablet (10 mg total) by mouth at bedtime. 30 tablet 0  . atorvastatin (LIPITOR) 10 MG tablet Take 10 mg by mouth daily.    . citalopram (CELEXA) 40 MG tablet Take 20 mg by mouth daily.    Marland Kitchen escitalopram (LEXAPRO) 10 MG tablet Take 10 mg by mouth daily.    . hydrocortisone cream 1 % Apply 1 application topically at bedtime as needed for itching.    . insulin aspart (NOVOLOG FLEXPEN) 100 UNIT/ML FlexPen Inject 4 Units into the skin 3 (three) times daily with meals. (Patient not taking: Reported on 04/26/2019) 15 mL 1  . Insulin Glargine (LANTUS SOLOSTAR) 100 UNIT/ML Solostar Pen Inject 20 Units into the skin at bedtime. 15 mL 1  . levETIRAcetam (KEPPRA) 500 MG  tablet Take 1 tablet (500 mg total) by mouth daily. 30 tablet 0  . levETIRAcetam (KEPPRA) 500 MG tablet Take 500 mg by mouth daily.    Marland Kitchen loratadine (CLARITIN) 10 MG tablet Take 10 mg by mouth daily.    . Rivaroxaban (XARELTO) 15 MG TABS tablet Take 15 mg by mouth at bedtime.    . Semaglutide,0.25 or 0.5MG /DOS, 2 MG/1.5ML SOPN Inject 0.5 mg into the skin once a week.      Inpatient Medications    Scheduled Meds: . apixaban  5 mg Oral BID  . furosemide  40 mg Intravenous BID  . guaiFENesin  600 mg Oral BID  . influenza vac  split quadrivalent PF  0.5 mL Intramuscular Tomorrow-1000  . insulin aspart  0-6 Units Subcutaneous TID WC  . nicotine  21 mg Transdermal Daily  . sodium chloride flush  3 mL Intravenous Q12H    Continuous Infusions: . sodium chloride 250 mL (04/26/19 1816)    PRN Meds: sodium chloride, acetaminophen, hydrALAZINE, ondansetron (ZOFRAN) IV, sodium chloride flush   ALLERGIES   No Known Allergies  ROS   Pertinent items noted in HPI and remainder of comprehensive ROS otherwise negative.  Vitals   Vitals:   04/28/19 0400 04/28/19 0500 04/28/19 0519 04/28/19 0540  BP: (!) 145/102     Pulse: 62 81    Resp: 16 (!) 32  15  Temp: 97.8 F (36.6 C)     TempSrc: Oral     SpO2: 99% 100%    Weight:   89.3 kg   Height:        Intake/Output Summary (Last 24 hours) at 04/28/2019 0910 Last data filed at 04/28/2019 0500 Gross per 24 hour  Intake 1183 ml  Output 1900 ml  Net -717 ml   Filed Weights   04/26/19 1815 04/27/19 0400 04/28/19 0519  Weight: 91.9 kg 92.1 kg 89.3 kg    Physical Exam   General appearance: alert and no distress Neck: JVD - several cm above sternal notch, no adenopathy, supple, symmetrical, trachea midline and thyroid not enlarged, symmetric, no tenderness/mass/nodules Lungs: diminished breath sounds RLL and RML Heart: regular rate and rhythm, S1, S2 normal and systolic murmur: systolic ejection 2/6, crescendo at 2nd right intercostal space Abdomen: soft, non-tender; bowel sounds normal; no masses,  no organomegaly Extremities: edema trace pedal Pulses: 2+ and symmetric Skin: multiple papules/macules and excoriations Neurologic: Mental status: Alert, oriented, thought content appropriate Psych: Pleasant, appears desheveled  Labs   Results for orders placed or performed during the hospital encounter of 04/26/19 (from the past 48 hour(s))  Basic metabolic panel     Status: Abnormal   Collection Time: 04/26/19  2:14 PM  Result Value Ref Range   Sodium 139  135 - 145 mmol/L   Potassium 4.9 3.5 - 5.1 mmol/L   Chloride 107 98 - 111 mmol/L   CO2 20 (L) 22 - 32 mmol/L   Glucose, Bld 103 (H) 70 - 99 mg/dL    Comment: Glucose reference range applies only to samples taken after fasting for at least 8 hours.   BUN 32 (H) 8 - 23 mg/dL   Creatinine, Ser 3.04 (H) 0.61 - 1.24 mg/dL   Calcium 9.2 8.9 - 10.3 mg/dL   GFR calc non Af Amer 21 (L) >60 mL/min   GFR calc Af Amer 24 (L) >60 mL/min   Anion gap 12 5 - 15    Comment: Performed at [facility] [address]  [place], [place] [address]  CBC with Differential     Status: Abnormal   Collection Time: 04/26/19  2:14 PM  Result Value Ref Range   WBC 5.8 4.0 - 10.5 K/uL   RBC 4.83 4.22 - 5.81 MIL/uL   Hemoglobin 13.6 13.0 - 17.0 g/dL   HCT 43.9 39.0 - 52.0 %   MCV 90.9 80.0 - 100.0 fL   MCH 28.2 26.0 - 34.0 pg   MCHC 31.0 30.0 - 36.0 g/dL   RDW 15.8 (H) 11.5 - 15.5 %   Platelets 175 150 - 400 K/uL   nRBC 0.0 0.0 - 0.2 %   Neutrophils Relative % 71 %   Neutro Abs 4.1 1.7 - 7.7 K/uL   Lymphocytes Relative 14 %   Lymphs Abs 0.8 0.7 - 4.0 K/uL   Monocytes Relative 12 %   Monocytes Absolute 0.7 0.1 - 1.0 K/uL   Eosinophils Relative 2 %   Eosinophils Absolute 0.1 0.0 - 0.5 K/uL   Basophils Relative 1 %   Basophils Absolute 0.1 0.0 - 0.1 K/uL   Immature Granulocytes 0 %   Abs Immature Granulocytes 0.02 0.00 - 0.07 K/uL    Comment: Performed at [facility] [address] [place], [place] [address]  Brain natriuretic peptide     Status: Abnormal   Collection Time: 04/26/19  2:14 PM  Result Value Ref Range   B Natriuretic Peptide 780.7 (H) 0.0 - 100.0 pg/mL    Comment: Performed at [facility] [address], [place], [place] [address]  Troponin I (High Sensitivity)     Status: Abnormal   Collection Time: 04/26/19  2:14 PM  Result Value Ref Range   Troponin I (High Sensitivity) 73 (H) <18 ng/L    Comment: (NOTE) Elevated high sensitivity troponin I (hsTnI) values and  significant  changes across serial measurements may suggest ACS but many other  chronic and acute conditions are known to elevate hsTnI results.  Refer to the "Links" section for chest pain algorithms and additional  guidance. Performed at [facility], [place] [address] [place], [place] [address]   SARS CORONAVIRUS 2 (TAT 6-24 HRS) Nasopharyngeal Nasopharyngeal Swab     Status: None   Collection Time: 04/26/19  2:53 PM   Specimen: Nasopharyngeal Swab  Result Value Ref Range   SARS Coronavirus 2 NEGATIVE NEGATIVE    Comment: (NOTE) SARS-CoV-2 target nucleic acids are NOT DETECTED. The SARS-CoV-2 RNA is generally detectable in upper and lower respiratory specimens during the acute phase of infection. Negative results do not preclude SARS-CoV-2 infection, do not rule out co-infections with other pathogens, and should not be used as the sole basis for treatment or other patient management decisions. Negative results must be combined with clinical observations, patient history, and epidemiological information. The expected result is Negative. Fact Sheet for Patients: SugarRoll.be Fact Sheet for Healthcare Providers: https://www.woods-mathews.com/ This test is not yet approved or cleared by the [place] FDA and  has been authorized for detection and/or diagnosis of SARS-CoV-2 by FDA under an Emergency Use Authorization (EUA). This EUA will remain  in effect (meaning this test can be used) for the duration of the COVID-19 declaration under Section 56 4(b)(1) of the Act, 21 U.S.C. section 360bbb-3(b)(1), unless the authorization is terminated or revoked sooner. Performed at [facility], [place] [address], [place], [place] [address]   Magnesium     Status: None   Collection Time: 04/26/19  4:20 PM  Result  Value Ref Range   Magnesium 1.8 1.7 - 2.4 mg/dL    Comment: Performed at [facility], [place] [address] [place],  [place] [address]  Troponin I (High Sensitivity)     Status: Abnormal   Collection Time: 04/26/19  4:20 PM  Result Value Ref Range   Troponin I (High Sensitivity) 71 (H) <18 ng/L    Comment: (NOTE) Elevated high sensitivity troponin I (hsTnI) values and significant  changes across serial measurements may suggest ACS but many other  chronic and acute conditions are known to elevate hsTnI results.  Refer to the "Links" section for chest pain algorithms and additional  guidance. Performed at [facility], [place] [address] [place], [place] [address]   Protime-INR     Status: Abnormal   Collection Time: 04/26/19  6:35 PM  Result Value Ref Range   Prothrombin Time 17.1 (H) 11.4 - 15.2 seconds   INR 1.4 (H) 0.8 - 1.2    Comment: (NOTE) INR goal varies based on device and disease states. Performed at [facility], [place] [address] [place], [place] [address]   APTT     Status: Abnormal   Collection Time: 04/26/19  6:35 PM  Result Value Ref Range   aPTT 177 (HH) 24 - 36 seconds    Comment:        IF BASELINE aPTT IS ELEVATED, SUGGEST PATIENT RISK ASSESSMENT BE USED TO DETERMINE APPROPRIATE ANTICOAGULANT THERAPY. REPEATED TO Dian Situ RN 2022 [id number] K FORSYTH Performed at [facility], [place] [address], [place], [place] [address]   Heparin level (unfractionated)     Status: Abnormal   Collection Time: 04/26/19  6:35 PM  Result Value Ref Range   Heparin Unfractionated 0.72 (H) 0.30 - 0.70 IU/mL    Comment: (NOTE) If heparin results are below expected values, and patient dosage has  been confirmed, suggest follow up testing of antithrombin III levels. Performed at [facility], [place] [address] [place], [place] [address]   Procalcitonin - Baseline     Status: None   Collection Time: 04/26/19  6:35 PM  Result Value Ref Range   Procalcitonin <0.10 ng/mL    Comment:        Interpretation: PCT (Procalcitonin) <= 0.5 ng/mL: Systemic infection (sepsis) is not  likely. Local bacterial infection is possible. (NOTE)       Sepsis PCT Algorithm           Lower Respiratory Tract                                      Infection PCT Algorithm    ----------------------------     ----------------------------         PCT < 0.25 ng/mL                PCT < 0.10 ng/mL         Strongly encourage             Strongly discourage   discontinuation of antibiotics    initiation of antibiotics    ----------------------------     -----------------------------       PCT 0.25 - 0.50 ng/mL            PCT 0.10 - 0.25 ng/mL               OR       >  80% decrease in PCT            Discourage initiation of                                            antibiotics      Encourage discontinuation           of antibiotics    ----------------------------     -----------------------------         PCT >= 0.50 ng/mL              PCT 0.26 - 0.50 ng/mL               AND        <80% decrease in PCT             Encourage initiation of                                             antibiotics       Encourage continuation           of antibiotics    ----------------------------     -----------------------------        PCT >= 0.50 ng/mL                  PCT > 0.50 ng/mL               AND         increase in PCT                  Strongly encourage                                      initiation of antibiotics    Strongly encourage escalation           of antibiotics                                     -----------------------------                                           PCT <= 0.25 ng/mL                                                 OR                                        > 80% decrease in PCT                                     Discontinue / Do not initiate  antibiotics Performed at [facility], [place] [address] [place], [place] [address]   MRSA PCR Screening     Status: None   Collection Time: 04/26/19  6:55 PM   Specimen:  Nasopharyngeal  Result Value Ref Range   MRSA by PCR NEGATIVE NEGATIVE    Comment:        The GeneXpert MRSA Assay (FDA approved for NASAL specimens only), is one component of a comprehensive MRSA colonization surveillance program. It is not intended to diagnose MRSA infection nor to guide or monitor treatment for MRSA infections. Performed at [facility], [place] [address], [place], [place] [address]   TSH     Status: None   Collection Time: 04/26/19  7:00 PM  Result Value Ref Range   TSH 2.723 0.350 - 4.500 uIU/mL    Comment: Performed by a 3rd Generation assay with a functional sensitivity of <=0.01 uIU/mL. Performed at [facility], [place] [address], [place], [place] [address]   Glucose, capillary     Status: Abnormal   Collection Time: 04/26/19  9:34 PM  Result Value Ref Range   Glucose-Capillary 145 (H) 70 - 99 mg/dL    Comment: Glucose reference range applies only to samples taken after fasting for at least 8 hours.  Basic metabolic panel     Status: Abnormal   Collection Time: 04/27/19  2:15 AM  Result Value Ref Range   Sodium 138 135 - 145 mmol/L   Potassium 4.0 3.5 - 5.1 mmol/L    Comment: NO VISIBLE HEMOLYSIS   Chloride 105 98 - 111 mmol/L   CO2 22 22 - 32 mmol/L   Glucose, Bld 202 (H) 70 - 99 mg/dL    Comment: Glucose reference range applies only to samples taken after fasting for at least 8 hours.   BUN 38 (H) 8 - 23 mg/dL   Creatinine, Ser 2.97 (H) 0.61 - 1.24 mg/dL   Calcium 9.0 8.9 - 10.3 mg/dL   GFR calc non Af Amer 21 (L) >60 mL/min   GFR calc Af Amer 25 (L) >60 mL/min   Anion gap 11 5 - 15    Comment: Performed at [facility] [address], [place], [place] [address]  Protime-INR     Status: Abnormal   Collection Time: 04/27/19  2:15 AM  Result Value Ref Range   Prothrombin Time 16.3 (H) 11.4 - 15.2 seconds   INR 1.3 (H) 0.8 - 1.2    Comment: (NOTE) INR goal varies based on device and disease states. Performed at [facility], [place] [address], [place], [place] [address]   Heparin level (unfractionated)     Status: Abnormal   Collection Time: 04/27/19  2:15 AM  Result Value Ref Range   Heparin Unfractionated 0.13 (L) 0.30 - 0.70 IU/mL    Comment: (NOTE) If heparin results are below expected values, and patient dosage has  been confirmed, suggest follow up testing of antithrombin III levels. Performed at [facility], [place] [address], [place], [place] [address]   APTT     Status: Abnormal   Collection Time: 04/27/19  2:15 AM  Result Value Ref Range   aPTT 59 (H) 24 - 36 seconds    Comment:        IF BASELINE aPTT IS ELEVATED, SUGGEST PATIENT RISK ASSESSMENT BE USED TO DETERMINE APPROPRIATE ANTICOAGULANT THERAPY. Performed at [facility], [place] [address] [place], [place] [address]  CBC     Status: Abnormal   Collection Time: 04/27/19  2:15 AM  Result Value Ref Range   WBC 5.0 4.0 - 10.5 K/uL   RBC 5.03 4.22 - 5.81 MIL/uL   Hemoglobin 14.1 13.0 - 17.0 g/dL   HCT 44.4 39.0 - 52.0 %   MCV 88.3 80.0 - 100.0 fL   MCH 28.0 26.0 - 34.0 pg   MCHC 31.8 30.0 - 36.0 g/dL   RDW 15.9 (H) 11.5 - 15.5 %   Platelets 171 150 - 400 K/uL   nRBC 0.0 0.0 - 0.2 %    Comment: Performed at [facility], [place] [address], [place], [place] [address]  Hemoglobin A1c     Status: Abnormal   Collection Time: 04/27/19  2:15 AM  Result Value Ref Range   Hgb A1c MFr Bld 8.8 (H) 4.8 - 5.6 %    Comment: (NOTE) Pre diabetes:          5.7%-6.4% Diabetes:              >6.4% Glycemic control for   <7.0% adults with diabetes    Mean Plasma Glucose 205.86 mg/dL    Comment: Performed at [facility] [address], [place], [place] [address]  HIV Antibody (routine testing w rflx)     Status: None   Collection Time: 04/27/19  2:15 AM  Result Value Ref Range   HIV Screen 4th Generation wRfx NON REACTIVE NON REACTIVE    Comment: Performed at [facility] [address] [place], [place]  [address]  Creatinine, urine, random     Status: None   Collection Time: 04/27/19  4:12 AM  Result Value Ref Range   Creatinine, Urine 33.10 mg/dL    Comment: Performed at [facility] [address], [place], [place] [address]  Sodium, urine, random     Status: None   Collection Time: 04/27/19  4:12 AM  Result Value Ref Range   Sodium, Ur 125 mmol/L    Comment: Performed at [facility] [address], [place], [place] [address]  Glucose, capillary     Status: None   Collection Time: 04/27/19  6:13 AM  Result Value Ref Range   Glucose-Capillary 91 70 - 99 mg/dL    Comment: Glucose reference range applies only to samples taken after fasting for at least 8 hours.  Heparin level (unfractionated)     Status: Abnormal   Collection Time: 04/27/19  7:27 AM  Result Value Ref Range   Heparin Unfractionated 0.28 (L) 0.30 - 0.70 IU/mL    Comment: (NOTE) If heparin results are below expected values, and patient dosage has  been confirmed, suggest follow up testing of antithrombin III levels. Performed at [facility], [place] [address] [place], [place] [address]   Glucose, capillary     Status: Abnormal   Collection Time: 04/27/19  4:52 PM  Result Value Ref Range   Glucose-Capillary 109 (H) 70 - 99 mg/dL    Comment: Glucose reference range applies only to samples taken after fasting for at least 8 hours.  Urinalysis, Routine w reflex microscopic     Status: Abnormal   Collection Time: 04/27/19  5:42 PM  Result Value Ref Range   Color, Urine YELLOW YELLOW   APPearance CLEAR CLEAR   Specific Gravity, Urine 1.009 1.005 - 1.030   pH 5.0 5.0 - 8.0   Glucose, UA NEGATIVE NEGATIVE mg/dL   Hgb  urine dipstick SMALL (A) NEGATIVE   Bilirubin Urine NEGATIVE NEGATIVE   Ketones, ur NEGATIVE NEGATIVE mg/dL   Protein, ur 100 (A) NEGATIVE mg/dL   Nitrite NEGATIVE NEGATIVE   Leukocytes,Ua NEGATIVE NEGATIVE   RBC / HPF 0-5 0 - 5 RBC/hpf   WBC, UA 0-5 0 - 5 WBC/hpf   Bacteria, UA RARE  (A) NONE SEEN    Comment: Performed at [facility] [address] [place], [place] [address]  Glucose, capillary     Status: Abnormal   Collection Time: 04/27/19  9:23 PM  Result Value Ref Range   Glucose-Capillary 178 (H) 70 - 99 mg/dL    Comment: Glucose reference range applies only to samples taken after fasting for at least 8 hours.   Comment 1 Notify RN    Comment 2 Document in Chart   Basic metabolic panel     Status: Abnormal   Collection Time: 04/28/19  4:26 AM  Result Value Ref Range   Sodium 138 135 - 145 mmol/L   Potassium 4.2 3.5 - 5.1 mmol/L   Chloride 105 98 - 111 mmol/L   CO2 22 22 - 32 mmol/L   Glucose, Bld 125 (H) 70 - 99 mg/dL    Comment: Glucose reference range applies only to samples taken after fasting for at least 8 hours.   BUN 40 (H) 8 - 23 mg/dL   Creatinine, Ser 2.62 (H) 0.61 - 1.24 mg/dL   Calcium 8.7 (L) 8.9 - 10.3 mg/dL   GFR calc non Af Amer 25 (L) >60 mL/min   GFR calc Af Amer 29 (L) >60 mL/min   Anion gap 11 5 - 15    Comment: Performed at [facility] [address], [place], [place] [address]  CBC     Status: Abnormal   Collection Time: 04/28/19  4:26 AM  Result Value Ref Range   WBC 4.6 4.0 - 10.5 K/uL   RBC 4.69 4.22 - 5.81 MIL/uL   Hemoglobin 13.1 13.0 - 17.0 g/dL   HCT 41.2 39.0 - 52.0 %   MCV 87.8 80.0 - 100.0 fL   MCH 27.9 26.0 - 34.0 pg   MCHC 31.8 30.0 - 36.0 g/dL   RDW 15.8 (H) 11.5 - 15.5 %   Platelets 157 150 - 400 K/uL   nRBC 0.0 0.0 - 0.2 %    Comment: Performed at [facility], [place] [address] [place], [place] [address]  Glucose, capillary     Status: None   Collection Time: 04/28/19  6:32 AM  Result Value Ref Range   Glucose-Capillary 98 70 - 99 mg/dL    Comment: Glucose reference range applies only to samples taken after fasting for at least 8 hours.   Comment 1 Notify RN    Comment 2 Document in Chart     ECG   Atrial flutter with paced beats - Personally Reviewed  Telemetry   V paced in the  70's - Personally Reviewed  Radiology   DG Chest 2 View  Result Date: 04/26/2019 CLINICAL DATA:  Shortness of breath. EXAM: CHEST - 2 VIEW COMPARISON:  06/17/2017. FINDINGS: Prior CABG. Cardiac pacer with lead tip over the right atrium and right ventricle. Prior cardiac valve replacement. Cardiomegaly. No pulmonary venous congestion. Right base infiltrate. Moderate right pleural effusion. No pneumothorax. IMPRESSION: 1. Prior CABG. Cardiac pacer with lead tip over the right atrium right ventricle. Cardiomegaly. No pulmonary venous congestion. 2.  Right base infiltrate.  Moderate  right pleural effusion. Electronically Signed   By: Marcello Moores  Register   On: 04/26/2019 14:23   ECHOCARDIOGRAM COMPLETE  Result Date: 04/27/2019    ECHOCARDIOGRAM REPORT   Patient Name:   Kyle Newton Date of Exam: 04/27/2019 Medical Rec #:  [id number]     Height:       69.0 in Accession #:    [id number]    Weight:       203.1 lb Date of Birth:  1955/09/12    BSA:          2.080 m Patient Age:    59 years      BP:           156/102 mmHg Patient Gender: M             HR:           60 bpm. Exam Location:  Inpatient Procedure: 2D Echo, Cardiac Doppler and Color Doppler Indications:    I50.33 Acute on chronic diastolic (congestive) heart failure  History:        Patient has prior history of Echocardiogram examinations, most                 recent 06/24/2017. Defibrillator, Aortic Valve Disease; Risk                 Factors:Hypertension and Diabetes.                 Aortic Valve: bioprosthetic valve is present in the aortic                 position.  Sonographer:    Tiffany Dance Referring Phys: [id number] RONDELL A SMITH IMPRESSIONS  1. Left ventricular ejection fraction, by estimation, is 25 to 30%. The left ventricle has severely decreased function. The left ventricle demonstrates global hypokinesis. There is mild left ventricular hypertrophy. Septal, distal anteroseptal and apical dyskinesis.  2. Right ventricular systolic function is mildly  reduced. The right ventricular size is moderately enlarged. An AICD wire is visualized. There is moderately elevated pulmonary artery systolic pressure.  3. The mitral valve is abnormal. Trivial mitral valve regurgitation.  4. The tricuspid valve is abnormal. Tricuspid valve regurgitation is moderate.  5. The aortic valve has been repaired/replaced. Aortic valve regurgitation is not visualized. Moderate aortic valve stenosis. There is a bioprosthetic valve present in the aortic position. Aortic valve mean gradient measures 14.0 mmHg.  6. Left atrial size was moderately dilated.  7. Right atrial size was moderately dilated. Comparison(s): Changes from prior study are noted. 06/24/2017: LVEF 45-50%. FINDINGS  Left Ventricle: Left ventricular ejection fraction, by estimation, is 25 to 30%. The left ventricle has severely decreased function. The left ventricle demonstrates global hypokinesis. The left ventricular internal cavity size was normal in size. There is mild left ventricular hypertrophy. Left ventricular diastolic parameters are consistent with Grade III diastolic dysfunction (restrictive). Elevated left ventricular end-diastolic pressure. Right Ventricle: The right ventricular size is moderately enlarged. No increase in right ventricular wall thickness. Right ventricular systolic function is mildly reduced. There is moderately elevated pulmonary artery systolic pressure. The tricuspid regurgitant velocity is 2.52 m/s, and with an assumed right atrial pressure of 15 mmHg, the estimated right ventricular systolic pressure is 37.4 mmHg. Left Atrium: Left atrial size was moderately dilated. Right Atrium: Right atrial size was moderately dilated. Pericardium: Trivial pericardial effusion is present. The pericardial effusion is circumferential. Mitral Valve: The mitral valve is abnormal. Moderate to severe mitral annular calcification. Trivial mitral valve  regurgitation. Tricuspid Valve: The tricuspid valve is  abnormal. Tricuspid valve regurgitation is moderate. Aortic Valve: The aortic valve has been repaired/replaced. Aortic valve regurgitation is not visualized. Moderate aortic stenosis is present. Aortic valve mean gradient measures 14.0 mmHg. Aortic valve peak gradient measures 29.9 mmHg. Aortic valve area,  by VTI measures 0.55 cm. There is a bioprosthetic valve present in the aortic position. Pulmonic Valve: The pulmonic valve was grossly normal. Pulmonic valve regurgitation is not visualized. Aorta: The aortic root and ascending aorta are structurally normal, with no evidence of dilitation. IAS/Shunts: The interatrial septum was not well visualized. Additional Comments: A AICD wire is visualized.  LEFT VENTRICLE PLAX 2D LVIDd:         5.57 cm  Diastology LVIDs:         5.01 cm  LV e' lateral:   6.53 cm/s LV PW:         1.05 cm  LV E/e' lateral: 23.6 LV IVS:        1.07 cm  LV e' medial:    3.16 cm/s LVOT diam:     2.10 cm  LV E/e' medial:  48.7 LV SV:         37 LV SV Index:   18 LVOT Area:     3.46 cm  RIGHT VENTRICLE            IVC RV Basal diam:  3.37 cm    IVC diam: 3.70 cm RV Mid diam:    3.20 cm RV S prime:     3.30 cm/s TAPSE (M-mode): 0.8 cm LEFT ATRIUM             Index       RIGHT ATRIUM           Index LA diam:        4.50 cm 2.16 cm/m  RA Area:     27.50 cm LA Vol (A2C):   81.7 ml 39.29 ml/m RA Volume:   107.00 ml 51.45 ml/m LA Vol (A4C):   54.5 ml 26.21 ml/m LA Biplane Vol: 68.3 ml 32.84 ml/m  AORTIC VALVE AV Area (Vmax):    0.70 cm AV Area (Vmean):   0.56 cm AV Area (VTI):     0.55 cm AV Vmax:           273.60 cm/s AV Vmean:          230.000 cm/s AV VTI:            0.670 m AV Peak Grad:      29.9 mmHg AV Mean Grad:      14.0 mmHg LVOT Vmax:         55.00 cm/s LVOT Vmean:        36.900 cm/s LVOT VTI:          0.107 m LVOT/AV VTI ratio: 0.16  AORTA Ao Root diam: 3.70 cm Ao Asc diam:  3.70 cm MITRAL VALVE                TRICUSPID VALVE MV Area (PHT): 5.97 cm     TR Peak grad:   25.4 mmHg MV  Decel Time: 127 msec     TR Vmax:        252.00 cm/s MV E velocity: 154.00 cm/s MV A velocity: 50.80 cm/s   SHUNTS MV E/A ratio:  3.03         Systemic VTI:  0.11 m  Systemic Diam: 2.10 cm Lyman Bishop MD Electronically signed by Lyman Bishop MD Signature Date/Time: 04/27/2019/12:26:49 PM    Final     Cardiac Studies   See echo  Impression   1. Principal Problem: 2.   Acute exacerbation of CHF (congestive heart failure) ([place]) 3. Active Problems: 4.   Scabies 5.   ICD (implantable cardioverter-defibrillator) in place 6.   History of seizures 7.   Diabetes mellitus type 2 with complications ([place]) 8.   Community acquired pneumonia 9.   Acute renal failure superimposed on stage 3a chronic kidney disease ([place]) 10.   Atrial fibrillation ([place]) 11.   Elevated troponin 12. Atrial flutter  Recommendation   Acute on chronic systolic congestive heart failure: Echo shows newly reduced LVEF to 25 to 30% with global hypokinesis.  An AICD is noted.  Prior aortic valve replacement is noted which is bioprosthetic and appears to be functioning normally.  He has been medically noncompliant.  - Continue diuresis with Lasix 40 mg IV twice daily -Blood pressure remains poorly controlled, will start hydralazine and nitrate for afterload reduction in the setting of acute renal failure.  Ultimately when he is more compensated would recommend adding beta-blocker.  Would hold off on ACE-I, ARB, or ARNI given AKI.  History of aortic valve replacement: Bioprosthetic, mean gradient 40 mmHg, appears stable  Status post AICD: Unknown device, will need interrogation.  He said it was placed at [facility] in the past, however notes suggest it may have been placed at [facility] in 2006 or 2007.  There does appear to be pacing therefore the device is not at EOL.  Atrial fibrillation/flutter: He appears to be in atrial flutter with pacing.  Interrogation of his device will  help [place] determine the extent and duration of his atrial arrhythmias.?  If he would be a candidate for cardioversion at some point.  He has been on Xarelto in the past, however now he is on Eliquis.  Uncontrolled hypertension: Blood pressure remains poorly controlled.  Will start afterload reduction and reintroduce heart failure medications including a beta-blocker as he is more compensated.  Ultimately could consider RAS blockade depending on renal function.  Elevated troponin: Flat at around 70-75, suspect demand ischemia and acute renal failure as precipitating  AKI: Creatinine over 3, improving with diuresis, suspect cardiorenal syndrome.  Baseline creatinine is unclear.  Per hospital service Scabies bites History of seizures Tobacco abuse Type 2 diabetes-A1c 8.8 Possible community-acquired pneumonia COVID-19 negative  Thanks for the consultation, cardiology will follow with you  Time Spent Directly with Patient:  I have spent a total of 45 minutes with the patient reviewing hospital notes, telemetry, EKGs, labs and examining the patient as well as establishing an assessment and plan that was discussed personally with the patient.  > 50% of time was spent in direct patient care.  Length of Stay:  LOS: 2 days   Pixie Casino, MD, [facility], [place] Director of the Advanced Lipid Disorders &  Cardiovascular Risk Reduction Clinic Diplomate of the American Board of Clinical Lipidology Attending Cardiologist  Direct Dial: [phone]  Fax: [phone]  Website:  [email or web address] Brysun Eschmann 04/28/2019, 9:10 AM

## 2019-04-28 NOTE — Progress Notes (Addendum)
PROGRESS NOTE    Kyle Newton  QBH:[id number] DOB: [birth year] DOA: 04/26/2019 PCP: Patient, No Pcp Per  Brief Narrative: 64 year old noncompliant male with history of chronic systolic CHF, type 2 diabetes mellitus, history of aortic valve replacement, ICD, remote history of seizure disorder, has not seen a doctor or taken any of his cardiac medications for well over a year presented to the ED with progressive dyspnea on exertion, orthopnea and lower extremity edema for 1 week. -Work-up in the emergency room noted elevated blood pressure, creatinine of 3.0, BNP of 780, chest x-ray with cardiomegaly, pleural effusion, questionable right basilar infiltrate. -Improving with diuresis, 2D echo noted EF of 25 to 30% with global hypokinesis -Cardiology consulted  Assessment & Plan:   Acute on chronic systolic congestive heart failure  History of AVR -Noncompliance with all cardiac meds for over 1 year -Clinically improving with diuresis, continue IV Lasix 40 mg twice daily  -2D echocardiogram with EF of 25 to 30%, global hypokinesis , which is a drop in EF from echo in 2019  -TSH is within normal limits  -Will request cardiology consult, no records in our system has had fragmented care in [place] and [place]   Questionable right lower lobe infiltrate -Clinically I think this is all CHF related, he is afebrile white count is normal and procalcitonin is less than 0.1 -Antibiotics discontinued yesterday, remained stable  Acute renal failure superimposed on chronic kidney disease stage III:  -Patient presents with creatinine elevated up to 3.04 with BUN 32.  Creatinine previously noted to be around 1.57 back in 2019.  -Recent baseline unknown -Creatinine improving with diuresis, down to 2.6 today, urinalysis positive for moderate proteinuria -Monitor kidney function with diuresis Elevated troponin: High-sensitivity troponins mildly elevated 73->71, suspect this is secondary to  demand in the setting of CHF and CKD, No symptoms of ACS, monitor  Paroxysmal atrial fibrillation: Patient has been off of Xarelto. Appears currently rate controlled. -Started on Eliquis this admission given renal insufficiency  Status post AICD, history of aortic valve replacement: Thought to be in [place] fear [facility] in 2006 or 2007.  Diabetes mellitus type 2:  - Last hemoglobin A1c was 16.3 in 05/2017.  -Hemoglobin A1c is 8.8, CBG stable now continue sliding scale insulin -Depending on where kidney function stabilizes will decide regarding long-term insulin  Recent scabies: . Patient on multiple scabs and lesions is all body. -Previously treated for same, I do not see active lesions now, evidence of a lot of postinflammatory hyperpigmentation seen -Continue contact precautions and permethrin cream   Essential hypertension:  -Starting hydralazine today, when euvolemic start beta-blocker  History of seizures: Patient reports that he has been off of [place] since 2019. Previously taking Keppra 500 mg daily. -No history of seizures recently, has not taken Keppra in 1 to 2 years -Will defer this to PCP  Tobacco abuse: Patient admits to smoking a pack cigarettes per day on average. -Counseled the patient on the need cessation of tobacco use. -Nicotine patch offered  DVT prophylaxis: eliquis Code Status: Full Family Communication:  Patient is estranged from his family, discussed with him extensively Disposition Plan:  Home pending improvement in volume status, CHF     Procedures:   Antimicrobials:    Subjective: -Breathing improving, not back to baseline yet, continues to have mild orthopnea and nonproductive cough  Objective: Vitals:   04/28/19 0519 04/28/19 0540 04/28/19 1200 04/28/19 1300  BP:   (!) 148/109   Pulse:  Resp:  15    Temp:    97.8 F (36.6 C)  TempSrc:    Oral  SpO2:    98%  Weight: 89.3 kg     Height:        Intake/Output Summary  (Last 24 hours) at 04/28/2019 1352 Last data filed at 04/28/2019 0900 Gross per 24 hour  Intake 1203 ml  Output 1900 ml  Net -697 ml   Filed Weights   04/26/19 1815 04/27/19 0400 04/28/19 0519  Weight: 91.9 kg 92.1 kg 89.3 kg    Examination:  Gen: Pleasant appears much older than stated age, AAO x2, no distress HEENT: Positive JVD Lungs: Bilateral basilar rales noted, otherwise clear CVS: S1-S2, regular rhythm, faint systolic ejection murmur noted Abd: soft, Non tender, non distended, BS present Extremities: Trace edema Skin: Extensive hyperpigmented lesions noted throughout his arms and legs especially abdominal wall Psychiatry:  Mood & affect appropriate.     Data Reviewed:   CBC: Recent Labs  Lab 04/26/19 1414 04/27/19 0215 04/28/19 0426  WBC 5.8 5.0 4.6  NEUTROABS 4.1  --   --   HGB 13.6 14.1 13.1  HCT 43.9 44.4 41.2  MCV 90.9 88.3 87.8  PLT 175 171 350   Basic Metabolic Panel: Recent Labs  Lab 04/26/19 1414 04/26/19 1620 04/27/19 0215 04/28/19 0426  NA 139  --  138 138  K 4.9  --  4.0 4.2  CL 107  --  105 105  CO2 20*  --  22 22  GLUCOSE 103*  --  202* 125*  BUN 32*  --  38* 40*  CREATININE 3.04*  --  2.97* 2.62*  CALCIUM 9.2  --  9.0 8.7*  MG  --  1.8  --   --    GFR: Estimated Creatinine Clearance: 31.5 mL/min (A) (by C-G formula based on SCr of 2.62 mg/dL (H)). Liver Function Tests: No results for input(s): AST, ALT, ALKPHOS, BILITOT, PROT, ALBUMIN in the last 168 hours. No results for input(s): LIPASE, AMYLASE in the last 168 hours. No results for input(s): AMMONIA in the last 168 hours. Coagulation Profile: Recent Labs  Lab 04/26/19 1835 04/27/19 0215  INR 1.4* 1.3*   Cardiac Enzymes: No results for input(s): CKTOTAL, CKMB, CKMBINDEX, TROPONINI in the last 168 hours. BNP (last 3 results) No results for input(s): PROBNP in the last 8760 hours. HbA1C: Recent Labs    04/27/19 0215  HGBA1C 8.8*   CBG: Recent Labs  Lab 04/27/19 0613  04/27/19 1652 04/27/19 2123 04/28/19 0632 04/28/19 1133  GLUCAP 91 109* 178* 98 164*   Lipid Profile: No results for input(s): CHOL, HDL, LDLCALC, TRIG, CHOLHDL, LDLDIRECT in the last 72 hours. Thyroid Function Tests: Recent Labs    04/26/19 1900  TSH 2.723   Anemia Panel: No results for input(s): VITAMINB12, FOLATE, FERRITIN, TIBC, IRON, RETICCTPCT in the last 72 hours. Urine analysis:    Component Value Date/Time   COLORURINE YELLOW 04/27/2019 1742   APPEARANCEUR CLEAR 04/27/2019 1742   LABSPEC 1.009 04/27/2019 1742   PHURINE 5.0 04/27/2019 1742   GLUCOSEU NEGATIVE 04/27/2019 1742   HGBUR SMALL (A) 04/27/2019 1742   BILIRUBINUR NEGATIVE 04/27/2019 1742   KETONESUR NEGATIVE 04/27/2019 1742   PROTEINUR 100 (A) 04/27/2019 1742   NITRITE NEGATIVE 04/27/2019 1742   LEUKOCYTESUR NEGATIVE 04/27/2019 1742   Sepsis Labs: @LABRCNTIP (procalcitonin:4,lacticidven:4)  ) Recent Results (from the past 240 hour(s))  SARS CORONAVIRUS 2 (TAT 6-24 HRS) Nasopharyngeal Nasopharyngeal Swab     Status:  None   Collection Time: 04/26/19  2:53 PM   Specimen: Nasopharyngeal Swab  Result Value Ref Range Status   SARS Coronavirus 2 NEGATIVE NEGATIVE Final    Comment: (NOTE) SARS-CoV-2 target nucleic acids are NOT DETECTED. The SARS-CoV-2 RNA is generally detectable in upper and lower respiratory specimens during the acute phase of infection. Negative results do not preclude SARS-CoV-2 infection, do not rule out co-infections with other pathogens, and should not be used as the sole basis for treatment or other patient management decisions. Negative results must be combined with clinical observations, patient history, and epidemiological information. The expected result is Negative. Fact Sheet for Patients: SugarRoll.be Fact Sheet for Healthcare Providers: https://www.woods-mathews.com/ This test is not yet approved or cleared by the [place] FDA  and  has been authorized for detection and/or diagnosis of SARS-CoV-2 by FDA under an Emergency Use Authorization (EUA). This EUA will remain  in effect (meaning this test can be used) for the duration of the COVID-19 declaration under Section 56 4(b)(1) of the Act, 21 U.S.C. section 360bbb-3(b)(1), unless the authorization is terminated or revoked sooner. Performed at [facility], [place] [address], [place], [place] [address]   MRSA PCR Screening     Status: None   Collection Time: 04/26/19  6:55 PM   Specimen: Nasopharyngeal  Result Value Ref Range Status   MRSA by PCR NEGATIVE NEGATIVE Final    Comment:        The GeneXpert MRSA Assay (FDA approved for NASAL specimens only), is one component of a comprehensive MRSA colonization surveillance program. It is not intended to diagnose MRSA infection nor to guide or monitor treatment for MRSA infections. Performed at [facility], [place] [address], [place], [place] [address]      Radiology Studies: DG Chest 2 View  Result Date: 04/26/2019 CLINICAL DATA:  Shortness of breath. EXAM: CHEST - 2 VIEW COMPARISON:  06/17/2017. FINDINGS: Prior CABG. Cardiac pacer with lead tip over the right atrium and right ventricle. Prior cardiac valve replacement. Cardiomegaly. No pulmonary venous congestion. Right base infiltrate. Moderate right pleural effusion. No pneumothorax. IMPRESSION: 1. Prior CABG. Cardiac pacer with lead tip over the right atrium right ventricle. Cardiomegaly. No pulmonary venous congestion. 2.  Right base infiltrate.  Moderate right pleural effusion. Electronically Signed   By: Marcello Moores  Register   On: 04/26/2019 14:23   ECHOCARDIOGRAM COMPLETE  Result Date: 04/27/2019    ECHOCARDIOGRAM REPORT   Patient Name:   Kyle Newton Date of Exam: 04/27/2019 Medical Rec #:  [id number]     Height:       69.0 in Accession #:    [id number]    Weight:       203.1 lb Date of Birth:  [birth year]    BSA:          2.080 m Patient Age:    89  years      BP:           156/102 mmHg Patient Gender: M             HR:           60 bpm. Exam Location:  Inpatient Procedure: 2D Echo, Cardiac Doppler and Color Doppler Indications:    I50.33 Acute on chronic diastolic (congestive) heart failure  History:        Patient has prior history of Echocardiogram examinations, most                 recent 06/24/2017.  Defibrillator, Aortic Valve Disease; Risk                 Factors:Hypertension and Diabetes.                 Aortic Valve: bioprosthetic valve is present in the aortic                 position.  Sonographer:    Tiffany Dance Referring Phys: [id number] RONDELL A SMITH IMPRESSIONS  1. Left ventricular ejection fraction, by estimation, is 25 to 30%. The left ventricle has severely decreased function. The left ventricle demonstrates global hypokinesis. There is mild left ventricular hypertrophy. Septal, distal anteroseptal and apical dyskinesis.  2. Right ventricular systolic function is mildly reduced. The right ventricular size is moderately enlarged. An AICD wire is visualized. There is moderately elevated pulmonary artery systolic pressure.  3. The mitral valve is abnormal. Trivial mitral valve regurgitation.  4. The tricuspid valve is abnormal. Tricuspid valve regurgitation is moderate.  5. The aortic valve has been repaired/replaced. Aortic valve regurgitation is not visualized. Moderate aortic valve stenosis. There is a bioprosthetic valve present in the aortic position. Aortic valve mean gradient measures 14.0 mmHg.  6. Left atrial size was moderately dilated.  7. Right atrial size was moderately dilated. Comparison(s): Changes from prior study are noted. 06/24/2017: LVEF 45-50%. FINDINGS  Left Ventricle: Left ventricular ejection fraction, by estimation, is 25 to 30%. The left ventricle has severely decreased function. The left ventricle demonstrates global hypokinesis. The left ventricular internal cavity size was normal in size. There is mild left ventricular  hypertrophy. Left ventricular diastolic parameters are consistent with Grade III diastolic dysfunction (restrictive). Elevated left ventricular end-diastolic pressure. Right Ventricle: The right ventricular size is moderately enlarged. No increase in right ventricular wall thickness. Right ventricular systolic function is mildly reduced. There is moderately elevated pulmonary artery systolic pressure. The tricuspid regurgitant velocity is 2.52 m/s, and with an assumed right atrial pressure of 15 mmHg, the estimated right ventricular systolic pressure is 59.7 mmHg. Left Atrium: Left atrial size was moderately dilated. Right Atrium: Right atrial size was moderately dilated. Pericardium: Trivial pericardial effusion is present. The pericardial effusion is circumferential. Mitral Valve: The mitral valve is abnormal. Moderate to severe mitral annular calcification. Trivial mitral valve regurgitation. Tricuspid Valve: The tricuspid valve is abnormal. Tricuspid valve regurgitation is moderate. Aortic Valve: The aortic valve has been repaired/replaced. Aortic valve regurgitation is not visualized. Moderate aortic stenosis is present. Aortic valve mean gradient measures 14.0 mmHg. Aortic valve peak gradient measures 29.9 mmHg. Aortic valve area,  by VTI measures 0.55 cm. There is a bioprosthetic valve present in the aortic position. Pulmonic Valve: The pulmonic valve was grossly normal. Pulmonic valve regurgitation is not visualized. Aorta: The aortic root and ascending aorta are structurally normal, with no evidence of dilitation. IAS/Shunts: The interatrial septum was not well visualized. Additional Comments: A AICD wire is visualized.  LEFT VENTRICLE PLAX 2D LVIDd:         5.57 cm  Diastology LVIDs:         5.01 cm  LV e' lateral:   6.53 cm/s LV PW:         1.05 cm  LV E/e' lateral: 23.6 LV IVS:        1.07 cm  LV e' medial:    3.16 cm/s LVOT diam:     2.10 cm  LV E/e' medial:  48.7 LV SV:         37 LV  SV Index:   18  LVOT Area:     3.46 cm  RIGHT VENTRICLE            IVC RV Basal diam:  3.37 cm    IVC diam: 3.70 cm RV Mid diam:    3.20 cm RV S prime:     3.30 cm/s TAPSE (M-mode): 0.8 cm LEFT ATRIUM             Index       RIGHT ATRIUM           Index LA diam:        4.50 cm 2.16 cm/m  RA Area:     27.50 cm LA Vol (A2C):   81.7 ml 39.29 ml/m RA Volume:   107.00 ml 51.45 ml/m LA Vol (A4C):   54.5 ml 26.21 ml/m LA Biplane Vol: 68.3 ml 32.84 ml/m  AORTIC VALVE AV Area (Vmax):    0.70 cm AV Area (Vmean):   0.56 cm AV Area (VTI):     0.55 cm AV Vmax:           273.60 cm/s AV Vmean:          230.000 cm/s AV VTI:            0.670 m AV Peak Grad:      29.9 mmHg AV Mean Grad:      14.0 mmHg LVOT Vmax:         55.00 cm/s LVOT Vmean:        36.900 cm/s LVOT VTI:          0.107 m LVOT/AV VTI ratio: 0.16  AORTA Ao Root diam: 3.70 cm Ao Asc diam:  3.70 cm MITRAL VALVE                TRICUSPID VALVE MV Area (PHT): 5.97 cm     TR Peak grad:   25.4 mmHg MV Decel Time: 127 msec     TR Vmax:        252.00 cm/s MV E velocity: 154.00 cm/s MV A velocity: 50.80 cm/s   SHUNTS MV E/A ratio:  3.03         Systemic VTI:  0.11 m                             Systemic Diam: 2.10 cm Lyman Bishop MD Electronically signed by Lyman Bishop MD Signature Date/Time: 04/27/2019/12:26:49 PM    Final     Scheduled Meds: . apixaban  5 mg Oral BID  . furosemide  40 mg Intravenous BID  . guaiFENesin  600 mg Oral BID  . hydrALAZINE  25 mg Oral Q8H  . insulin aspart  0-6 Units Subcutaneous TID WC  . isosorbide mononitrate  30 mg Oral Daily  . nicotine  21 mg Transdermal Daily  . sodium chloride flush  3 mL Intravenous Q12H   Continuous Infusions: . sodium chloride 250 mL (04/26/19 1816)     LOS: 2 days    Time spent: 79min  Domenic Polite, MD [facility] 04/28/2019, 1:52 PM

## 2019-04-29 DIAGNOSIS — I5023 Acute on chronic systolic (congestive) heart failure: Secondary | ICD-10-CM

## 2019-04-29 LAB — BASIC METABOLIC PANEL
Anion gap: 11 (ref 5–15)
BUN: 41 mg/dL — ABNORMAL HIGH (ref 8–23)
CO2: 26 mmol/L (ref 22–32)
Calcium: 8.7 mg/dL — ABNORMAL LOW (ref 8.9–10.3)
Chloride: 101 mmol/L (ref 98–111)
Creatinine, Ser: 2.51 mg/dL — ABNORMAL HIGH (ref 0.61–1.24)
GFR calc Af Amer: 30 mL/min — ABNORMAL LOW (ref >60)
GFR calc non Af Amer: 26 mL/min — ABNORMAL LOW (ref >60)
Glucose, Bld: 114 mg/dL — ABNORMAL HIGH (ref 70–99)
Potassium: 4 mmol/L (ref 3.5–5.1)
Sodium: 138 mmol/L (ref 135–145)

## 2019-04-29 LAB — GLUCOSE, CAPILLARY
Glucose-Capillary: 105 mg/dL — ABNORMAL HIGH (ref 70–99)
Glucose-Capillary: 181 mg/dL — ABNORMAL HIGH (ref 70–99)
Glucose-Capillary: 194 mg/dL — ABNORMAL HIGH (ref 70–99)
Glucose-Capillary: 105 mg/dL — ABNORMAL HIGH (ref 70–99)
Glucose-Capillary: 226 mg/dL — ABNORMAL HIGH (ref 70–99)

## 2019-04-29 LAB — CBC
HCT: 42.6 % (ref 39.0–52.0)
Hemoglobin: 13.9 g/dL (ref 13.0–17.0)
MCH: 28.3 pg (ref 26.0–34.0)
MCHC: 32.6 g/dL (ref 30.0–36.0)
MCV: 86.6 fL (ref 80.0–100.0)
Platelets: 164 10*3/uL (ref 150–400)
RBC: 4.92 MIL/uL (ref 4.22–5.81)
RDW: 15.7 % — ABNORMAL HIGH (ref 11.5–15.5)
WBC: 3.6 10*3/uL — ABNORMAL LOW (ref 4.0–10.5)
nRBC: 0 % (ref 0.0–0.2)

## 2019-04-29 NOTE — Progress Notes (Signed)
PROGRESS NOTE    Kyle Newton  JZP:[id number] DOB: August 23, 1955 DOA: 04/26/2019 PCP: Patient, No Pcp Per  Brief Narrative: 64 year old noncompliant male with history of chronic systolic CHF, type 2 diabetes mellitus, history of aortic valve replacement, ICD, remote history of seizure disorder, has not seen a doctor or taken any of his cardiac medications for well over a year presented to the ED with progressive dyspnea on exertion, orthopnea and lower extremity edema for 1 week. -Work-up in the emergency room noted elevated blood pressure, creatinine of 3.0, BNP of 780, chest x-ray with cardiomegaly, pleural effusion, questionable right basilar infiltrate. -Improving with diuresis, 2D echo noted EF of 25 to 30% with global hypokinesis -Cardiology consulted  Assessment & Plan:   Acute on chronic systolic congestive heart failure  History of AVR -Noncompliance with all cardiac meds for over 1 year -Clinically improving with diuresis, he is -4.7 L  -continue IV Lasix 40 mg twice daily  -2D echocardiogram with EF of 25 to 30%, global hypokinesis , which is a drop in EF from echo in 2019  -TSH is within normal limits  -Appreciate cardiology input, will need close follow-up -Case management consult, needs PCP, home health  Questionable right lower lobe infiltrate -Clinically I think this is all CHF related, he is afebrile white count is normal and procalcitonin is less than 0.1 -Antibiotics discontinued, remains stable  Acute renal failure superimposed on chronic kidney disease stage III:  -Patient presents with creatinine elevated up to 3.04 with BUN 32.  Creatinine previously noted to be around 1.57 back in 2019.  -Recent baseline unknown -Creatinine improving with diuresis, down to 2.5 today, urinalysis positive for moderate proteinuria -Monitor kidney function with diuresis Elevated troponin: High-sensitivity troponins mildly elevated 73->71, suspect this is secondary to demand in the  setting of CHF and CKD, No symptoms of ACS, monitor  Paroxysmal atrial fibrillation: Patient has been off of Xarelto. Appears currently rate controlled. -Started on Eliquis this admission given renal insufficiency  Status post AICD, history of aortic valve replacement: Thought to be in [place] [facility] in 2006 or 2007.  Diabetes mellitus type 2:  - Last hemoglobin A1c was 16.3 in 05/2017.  -Hemoglobin A1c is 8.8, CBG stable now continue sliding scale insulin -Depending on where kidney function stabilizes will decide regarding long-term insulin  Recent scabies: . Patient on multiple scabs and lesions is all body. -Previously treated for same, I do not see active lesions now, evidence of a lot of postinflammatory hyperpigmentation seen -Continue contact precautions and permethrin cream   Essential hypertension:  -Blood pressure more stable, continue hydralazine and Imdur  -When euvolemic add beta-blocker   History of seizures: Patient reports that he has been off of [place] since 2019. Previously taking Keppra 500 mg daily. -No history of seizures recently, has not taken Keppra in 1 to 2 years -Will defer this to PCP  Tobacco abuse: Patient admits to smoking a pack cigarettes per day on average. -Counseled the patient on the need cessation of tobacco use. -Nicotine patch offered  DVT prophylaxis: eliquis Code Status: Full Family Communication:  Patient is estranged from his family, discussed with him extensively Disposition Plan:  Home pending improvement in volume status, CHF     Procedures:   Antimicrobials:    Subjective: -Breathing improving, not back to baseline yet, continues to have mild orthopnea and nonproductive cough  Objective: Vitals:   04/29/19 0411 04/29/19 0414 04/29/19 0500 04/29/19 0645  BP:  (!) 145/102  (!) 133/98  Pulse:  82 66   Resp:  18 19   Temp:  97.6 F (36.4 C)    TempSrc:  Oral    SpO2:  100% 100%   Weight: 86 kg       Height:        Intake/Output Summary (Last 24 hours) at 04/29/2019 1119 Last data filed at 04/29/2019 0857 Gross per 24 hour  Intake 1262 ml  Output 4100 ml  Net -2838 ml   Filed Weights   04/27/19 0400 04/28/19 0519 04/29/19 0411  Weight: 92.1 kg 89.3 kg 86 kg    Examination:  Gen: Pleasant appears much older than stated age, AAO x2, no distress HEENT: Positive JVD Lungs: Bilateral basilar rales noted, otherwise clear CVS: S1-S2, regular rhythm, faint systolic ejection murmur noted Abd: soft, Non tender, non distended, BS present Extremities: Trace edema Skin: Extensive hyperpigmented lesions noted throughout his arms and legs especially abdominal wall Psychiatry:  Mood & affect appropriate.     Data Reviewed:   CBC: Recent Labs  Lab 04/26/19 1414 04/27/19 0215 04/28/19 0426 04/29/19 0541  WBC 5.8 5.0 4.6 3.6*  NEUTROABS 4.1  --   --   --   HGB 13.6 14.1 13.1 13.9  HCT 43.9 44.4 41.2 42.6  MCV 90.9 88.3 87.8 86.6  PLT 175 171 157 035   Basic Metabolic Panel: Recent Labs  Lab 04/26/19 1414 04/26/19 1620 04/27/19 0215 04/28/19 0426 04/29/19 0541  NA 139  --  138 138 138  K 4.9  --  4.0 4.2 4.0  CL 107  --  105 105 101  CO2 20*  --  22 22 26   GLUCOSE 103*  --  202* 125* 114*  BUN 32*  --  38* 40* 41*  CREATININE 3.04*  --  2.97* 2.62* 2.51*  CALCIUM 9.2  --  9.0 8.7* 8.7*  MG  --  1.8  --   --   --    GFR: Estimated Creatinine Clearance: 32.3 mL/min (A) (by C-G formula based on SCr of 2.51 mg/dL (H)). Liver Function Tests: No results for input(s): AST, ALT, ALKPHOS, BILITOT, PROT, ALBUMIN in the last 168 hours. No results for input(s): LIPASE, AMYLASE in the last 168 hours. No results for input(s): AMMONIA in the last 168 hours. Coagulation Profile: Recent Labs  Lab 04/26/19 1835 04/27/19 0215  INR 1.4* 1.3*   Cardiac Enzymes: No results for input(s): CKTOTAL, CKMB, CKMBINDEX, TROPONINI in the last 168 hours. BNP (last 3 results) No results  for input(s): PROBNP in the last 8760 hours. HbA1C: Recent Labs    04/27/19 0215  HGBA1C 8.8*   CBG: Recent Labs  Lab 04/28/19 1133 04/28/19 1630 04/28/19 2107 04/29/19 0630 04/29/19 0826  GLUCAP 164* 117* 236* 105* 181*   Lipid Profile: No results for input(s): CHOL, HDL, LDLCALC, TRIG, CHOLHDL, LDLDIRECT in the last 72 hours. Thyroid Function Tests: Recent Labs    04/26/19 1900  TSH 2.723   Anemia Panel: No results for input(s): VITAMINB12, FOLATE, FERRITIN, TIBC, IRON, RETICCTPCT in the last 72 hours. Urine analysis:    Component Value Date/Time   COLORURINE YELLOW 04/27/2019 1742   APPEARANCEUR CLEAR 04/27/2019 1742   LABSPEC 1.009 04/27/2019 1742   PHURINE 5.0 04/27/2019 1742   GLUCOSEU NEGATIVE 04/27/2019 1742   HGBUR SMALL (A) 04/27/2019 1742   BILIRUBINUR NEGATIVE 04/27/2019 1742   KETONESUR NEGATIVE 04/27/2019 1742   PROTEINUR 100 (A) 04/27/2019 1742   NITRITE NEGATIVE 04/27/2019 1742   LEUKOCYTESUR NEGATIVE 04/27/2019  1742   Sepsis Labs: @LABRCNTIP (procalcitonin:4,lacticidven:4)  ) Recent Results (from the past 240 hour(s))  SARS CORONAVIRUS 2 (TAT 6-24 HRS) Nasopharyngeal Nasopharyngeal Swab     Status: None   Collection Time: 04/26/19  2:53 PM   Specimen: Nasopharyngeal Swab  Result Value Ref Range Status   SARS Coronavirus 2 NEGATIVE NEGATIVE Final    Comment: (NOTE) SARS-CoV-2 target nucleic acids are NOT DETECTED. The SARS-CoV-2 RNA is generally detectable in upper and lower respiratory specimens during the acute phase of infection. Negative results do not preclude SARS-CoV-2 infection, do not rule out co-infections with other pathogens, and should not be used as the sole basis for treatment or other patient management decisions. Negative results must be combined with clinical observations, patient history, and epidemiological information. The expected result is Negative. Fact Sheet for  Patients: SugarRoll.be Fact Sheet for Healthcare Providers: https://www.woods-mathews.com/ This test is not yet approved or cleared by the [place] FDA and  has been authorized for detection and/or diagnosis of SARS-CoV-2 by FDA under an Emergency Use Authorization (EUA). This EUA will remain  in effect (meaning this test can be used) for the duration of the COVID-19 declaration under Section 56 4(b)(1) of the Act, 21 U.S.C. section 360bbb-3(b)(1), unless the authorization is terminated or revoked sooner. Performed at [facility], [place] [address] [place], [place] [address]   MRSA PCR Screening     Status: None   Collection Time: 04/26/19  6:55 PM   Specimen: Nasopharyngeal  Result Value Ref Range Status   MRSA by PCR NEGATIVE NEGATIVE Final    Comment:        The GeneXpert MRSA Assay (FDA approved for NASAL specimens only), is one component of a comprehensive MRSA colonization surveillance program. It is not intended to diagnose MRSA infection nor to guide or monitor treatment for MRSA infections. Performed at [facility], [place] [address], [place], [place] [address]      Radiology Studies: No results found.  Scheduled Meds: . apixaban  5 mg Oral BID  . furosemide  40 mg Intravenous BID  . guaiFENesin  600 mg Oral BID  . hydrALAZINE  25 mg Oral Q8H  . insulin aspart  0-6 Units Subcutaneous TID WC  . isosorbide mononitrate  30 mg Oral Daily  . nicotine  21 mg Transdermal Daily  . pneumococcal 23 valent vaccine  0.5 mL Intramuscular Tomorrow-1000  . sodium chloride flush  3 mL Intravenous Q12H   Continuous Infusions: . sodium chloride 250 mL (04/26/19 1816)     LOS: 3 days    Time spent: 44min  Domenic Polite, MD [facility] 04/29/2019, 11:19 AM

## 2019-04-29 NOTE — Progress Notes (Addendum)
Progress Note  Patient Name: Kyle Newton Date of Encounter: 04/29/2019  Primary Cardiologist: [place] well today. Denies chest pain or SOB. Still with abdominal distention   Inpatient Medications    Scheduled Meds:  apixaban  5 mg Oral BID   furosemide  40 mg Intravenous BID   guaiFENesin  600 mg Oral BID   hydrALAZINE  25 mg Oral Q8H   insulin aspart  0-6 Units Subcutaneous TID WC   isosorbide mononitrate  30 mg Oral Daily   nicotine  21 mg Transdermal Daily   pneumococcal 23 valent vaccine  0.5 mL Intramuscular Tomorrow-1000   sodium chloride flush  3 mL Intravenous Q12H   Continuous Infusions:  sodium chloride 250 mL (04/26/19 1816)   PRN Meds: sodium chloride, acetaminophen, hydrALAZINE, ondansetron (ZOFRAN) IV, sodium chloride flush   Vital Signs    Vitals:   04/29/19 0411 04/29/19 0414 04/29/19 0500 04/29/19 0645  BP:  (!) 145/102  (!) 133/98  Pulse:  82 66   Resp:  18 19   Temp:  97.6 F (36.4 C)    TempSrc:  Oral    SpO2:  100% 100%   Weight: 86 kg     Height:        Intake/Output Summary (Last 24 hours) at 04/29/2019 0821 Last data filed at 04/29/2019 0414 Gross per 24 hour  Intake 1062 ml  Output 4450 ml  Net -3388 ml   Filed Weights   04/27/19 0400 04/28/19 0519 04/29/19 0411  Weight: 92.1 kg 89.3 kg 86 kg    Physical Exam   General: Well developed, well nourished, NAD Neck: Negative for carotid bruits. No JVD Lungs:Diminished bilaterally. No wheezes, rales, or rhonchi. Breathing is unlabored. Cardiovascular: RRR with S1 S2. + murmur Abdomen: Firm, non-tender, distended. No obvious abdominal masses. Extremities: No edema. Radial pulses 2+ bilaterally Neuro: Alert and oriented. No focal deficits. No facial asymmetry. MAE spontaneously. Psych: Responds to questions appropriately with normal affect.    Labs    Chemistry Recent Labs  Lab 04/27/19 0215 04/28/19 0426 04/29/19 0541  NA 138 138 138  K 4.0 4.2 4.0    CL 105 105 101  CO2 22 22 26   GLUCOSE 202* 125* 114*  BUN 38* 40* 41*  CREATININE 2.97* 2.62* 2.51*  CALCIUM 9.0 8.7* 8.7*  GFRNONAA 21* 25* 26*  GFRAA 25* 29* 30*  ANIONGAP 11 11 11      Hematology Recent Labs  Lab 04/27/19 0215 04/28/19 0426 04/29/19 0541  WBC 5.0 4.6 3.6*  RBC 5.03 4.69 4.92  HGB 14.1 13.1 13.9  HCT 44.4 41.2 42.6  MCV 88.3 87.8 86.6  MCH 28.0 27.9 28.3  MCHC 31.8 31.8 32.6  RDW 15.9* 15.8* 15.7*  PLT 171 157 164    Cardiac EnzymesNo results for input(s): TROPONINI in the last 168 hours. No results for input(s): TROPIPOC in the last 168 hours.   BNP Recent Labs  Lab 04/26/19 1414  BNP 780.7*     DDimer No results for input(s): DDIMER in the last 168 hours.   Radiology    ECHOCARDIOGRAM COMPLETE  Result Date: 04/27/2019    ECHOCARDIOGRAM REPORT   Patient Name:   Kyle Newton Date of Exam: 04/27/2019 Medical Rec #:  [id number]     Height:       69.0 in Accession #:    [id number]    Weight:       203.1 lb Date of Birth:  [birth year]    BSA:          2.080 m Patient Age:    63 years      BP:           156/102 mmHg Patient Gender: M             HR:           60 bpm. Exam Location:  Inpatient Procedure: 2D Echo, Cardiac Doppler and Color Doppler Indications:    I50.33 Acute on chronic diastolic (congestive) heart failure  History:        Patient has prior history of Echocardiogram examinations, most                 recent 06/24/2017. Defibrillator, Aortic Valve Disease; Risk                 Factors:Hypertension and Diabetes.                 Aortic Valve: bioprosthetic valve is present in the aortic                 position.  Sonographer:    Tiffany Dance Referring Phys: [id number] RONDELL A SMITH IMPRESSIONS  1. Left ventricular ejection fraction, by estimation, is 25 to 30%. The left ventricle has severely decreased function. The left ventricle demonstrates global hypokinesis. There is mild left ventricular hypertrophy. Septal, distal anteroseptal and apical  dyskinesis.  2. Right ventricular systolic function is mildly reduced. The right ventricular size is moderately enlarged. An AICD wire is visualized. There is moderately elevated pulmonary artery systolic pressure.  3. The mitral valve is abnormal. Trivial mitral valve regurgitation.  4. The tricuspid valve is abnormal. Tricuspid valve regurgitation is moderate.  5. The aortic valve has been repaired/replaced. Aortic valve regurgitation is not visualized. Moderate aortic valve stenosis. There is a bioprosthetic valve present in the aortic position. Aortic valve mean gradient measures 14.0 mmHg.  6. Left atrial size was moderately dilated.  7. Right atrial size was moderately dilated. Comparison(s): Changes from prior study are noted. 06/24/2017: LVEF 45-50%. FINDINGS  Left Ventricle: Left ventricular ejection fraction, by estimation, is 25 to 30%. The left ventricle has severely decreased function. The left ventricle demonstrates global hypokinesis. The left ventricular internal cavity size was normal in size. There is mild left ventricular hypertrophy. Left ventricular diastolic parameters are consistent with Grade III diastolic dysfunction (restrictive). Elevated left ventricular end-diastolic pressure. Right Ventricle: The right ventricular size is moderately enlarged. No increase in right ventricular wall thickness. Right ventricular systolic function is mildly reduced. There is moderately elevated pulmonary artery systolic pressure. The tricuspid regurgitant velocity is 2.52 m/s, and with an assumed right atrial pressure of 15 mmHg, the estimated right ventricular systolic pressure is 96.7 mmHg. Left Atrium: Left atrial size was moderately dilated. Right Atrium: Right atrial size was moderately dilated. Pericardium: Trivial pericardial effusion is present. The pericardial effusion is circumferential. Mitral Valve: The mitral valve is abnormal. Moderate to severe mitral annular calcification. Trivial mitral valve  regurgitation. Tricuspid Valve: The tricuspid valve is abnormal. Tricuspid valve regurgitation is moderate. Aortic Valve: The aortic valve has been repaired/replaced. Aortic valve regurgitation is not visualized. Moderate aortic stenosis is present. Aortic valve mean gradient measures 14.0 mmHg. Aortic valve peak gradient measures 29.9 mmHg. Aortic valve area,  by VTI measures 0.55 cm. There is a bioprosthetic valve present in the aortic position. Pulmonic Valve: The pulmonic valve was grossly normal. Pulmonic valve regurgitation is not visualized. Aorta:  The aortic root and ascending aorta are structurally normal, with no evidence of dilitation. IAS/Shunts: The interatrial septum was not well visualized. Additional Comments: A AICD wire is visualized.  LEFT VENTRICLE PLAX 2D LVIDd:         5.57 cm  Diastology LVIDs:         5.01 cm  LV e' lateral:   6.53 cm/s LV PW:         1.05 cm  LV E/e' lateral: 23.6 LV IVS:        1.07 cm  LV e' medial:    3.16 cm/s LVOT diam:     2.10 cm  LV E/e' medial:  48.7 LV SV:         37 LV SV Index:   18 LVOT Area:     3.46 cm  RIGHT VENTRICLE            IVC RV Basal diam:  3.37 cm    IVC diam: 3.70 cm RV Mid diam:    3.20 cm RV S prime:     3.30 cm/s TAPSE (M-mode): 0.8 cm LEFT ATRIUM             Index       RIGHT ATRIUM           Index LA diam:        4.50 cm 2.16 cm/m  RA Area:     27.50 cm LA Vol (A2C):   81.7 ml 39.29 ml/m RA Volume:   107.00 ml 51.45 ml/m LA Vol (A4C):   54.5 ml 26.21 ml/m LA Biplane Vol: 68.3 ml 32.84 ml/m  AORTIC VALVE AV Area (Vmax):    0.70 cm AV Area (Vmean):   0.56 cm AV Area (VTI):     0.55 cm AV Vmax:           273.60 cm/s AV Vmean:          230.000 cm/s AV VTI:            0.670 m AV Peak Grad:      29.9 mmHg AV Mean Grad:      14.0 mmHg LVOT Vmax:         55.00 cm/s LVOT Vmean:        36.900 cm/s LVOT VTI:          0.107 m LVOT/AV VTI ratio: 0.16  AORTA Ao Root diam: 3.70 cm Ao Asc diam:  3.70 cm MITRAL VALVE                TRICUSPID VALVE MV  Area (PHT): 5.97 cm     TR Peak grad:   25.4 mmHg MV Decel Time: 127 msec     TR Vmax:        252.00 cm/s MV E velocity: 154.00 cm/s MV A velocity: 50.80 cm/s   SHUNTS MV E/A ratio:  3.03         Systemic VTI:  0.11 m                             Systemic Diam: 2.10 cm Lyman Bishop MD Electronically signed by Lyman Bishop MD Signature Date/Time: 04/27/2019/12:26:49 PM    Final    Telemetry    04/29/19 V- paced with rates in the 60-70's - Personally Reviewed  ECG    No new tracing as of 04/29/19 - Personally Reviewed  Cardiac Studies   Echocardiogram 04/27/2019:  1. Left ventricular ejection fraction, by estimation, is 25 to 30%. The  left ventricle has severely decreased function. The left ventricle  demonstrates global hypokinesis. There is mild left ventricular  hypertrophy. Septal, distal anteroseptal and  apical dyskinesis.   2. Right ventricular systolic function is mildly reduced. The right  ventricular size is moderately enlarged. An AICD wire is visualized. There  is moderately elevated pulmonary artery systolic pressure.   3. The mitral valve is abnormal. Trivial mitral valve regurgitation.   4. The tricuspid valve is abnormal. Tricuspid valve regurgitation is  moderate.   5. The aortic valve has been repaired/replaced. Aortic valve  regurgitation is not visualized. Moderate aortic valve stenosis. There is  a bioprosthetic valve present in the aortic position. Aortic valve mean  gradient measures 14.0 mmHg.   6. Left atrial size was moderately dilated.   7. Right atrial size was moderately dilated.   Comparison(s): Changes from prior study are noted. 06/24/2017: LVEF 45-50%.   Patient Profile     64 y.o. male with history of systolic CHF, admitted with acute on chronic systolic CHF and reduced LVEF on echo, prior AICD, history of diabetes and hypertension  Assessment & Plan    1.  Acute on chronic systolic CHF: -Echocardiogram performed this admission shows newly reduced  LVEF at 25 to 30% with global hypokinesis>> evidence of AICD and prior aortic valve replacement with bioprosthetic noted.  [facility] cardiology care from [place] and [place] with evidence of fluid volume overload therefore he was placed on IV Lasix 40 mg twice daily -Weight, 189lb today with admission weight of 202lb -I&O, net negative 4.7L -Would continue IV Lasix at this time and follow clinical picture.  -Monitor renal function closely with daily BMET   2.  History of aortic valve replacement: -Noted on most recent echocardiogram with stable functioning bioprosthetic valve with a mean gradient of 40 mmHg  3. s/p AICD: -Unknown device>>> needs interrogation? -Per chart review, notes suggest possible placement at [place] remember date of insertion with no documentation available at this time   4.  Atrial fibrillation/flutter: -V-paced with unclear underlying rhythm  -Needs device interrogation however unclear if [employer], [employer] -Continue Eliquis -No sign of acute bleeding in stool or urine   5.  Uncontrolled hypertension: -Improved, 133/98>145/102>164/93 -Started on hydralazine and nitrate for afterload reduction in the setting of acute renal failure>>improved  -Will add beta-blocker -No ACEI/ARB or ARN I secondary to AKI  6.  Acute on chronic renal insufficiency: -Creatinine, 2.51 today down from 3.04 on admission -Baseline from 05/2017 at 1.7-1.9 -Avoid nephrotoxic medications  7.  Elevated troponin: -Flat trend, 73>> 71 -Suspect demand ischemia secondary to acute HF, ARF -Denies CP   Signed, Kathyrn Drown NP-C [facility] Pager: [phone] 04/29/2019, 8:21 AM     For questions or updates, please contact   Please consult [email or web address] for contact info under Cardiology/STEMI.  Patient examined chart reviewed. Discussed care with PA and patient. Exam with chronically ill black male from [place]. PPM under left clavicle basilar  crackles SEM through AVR no AR. ECG with wide LBBB QRS 170 msec ? Flutter. Continue diuresis. Not clear if compliant with DOAC prior to admission. Prior care sporadic due to homelessness. Care compromised due to CRF CR 2.5.Continue hydralazine/nitrates and diuresis . May benefit from [facility] after 3 weeks documented anticoagulation and eventually CRT upgrade. ? [employer] PPM will try to get interrogated today Functional status poor needs to ambulate in halls before  ready for d/c   Jenkins Rouge MD [facility]

## 2019-04-29 NOTE — Evaluation (Signed)
Physical Therapy Evaluation Patient Details Name: Kyle Newton MRN: [id number] DOB: 11-22-1955 Today's Date: 04/29/2019   History of Present Illness  Pt is a 64 y/o male admitted secondary to SOB and LE swelling. PMH includes s/p ICD, scabies, DM, a fib, s/p AVR, and tobacco use.   Clinical Impression  Pt admitted secondary to problem above with deficits below. Pt requiring min guard A for mobility tasks. Had slight limp, but reports no pain or weakness. Pt reports no one available to check on him at d/c. Feel he would benefit from HHPT for safety eval to ensure safety at home. Will continue to follow acutely to maximize functional mobility independence and safety.     Follow Up Recommendations Home health PT(for safety eval)    Equipment Recommendations  None recommended by PT    Recommendations for Other Services       Precautions / Restrictions Precautions Precautions: Fall Restrictions Weight Bearing Restrictions: No      Mobility  Bed Mobility Overal bed mobility: Needs Assistance Bed Mobility: Supine to Sit;Sit to Supine     Supine to sit: Supervision Sit to supine: Supervision   General bed mobility comments: Supervision for safety.   Transfers Overall transfer level: Needs assistance Equipment used: None Transfers: Sit to/from Stand Sit to Stand: Min guard         General transfer comment: Min guard for safety.   Ambulation/Gait Ambulation/Gait assistance: Min guard Gait Distance (Feet): 40 Feet Assistive device: None Gait Pattern/deviations: Step-through pattern;Decreased stride length;Decreased stance time - right Gait velocity: Decreased   General Gait Details: Pt noted to be limping with decreased weightshift to the R, however, pt reports no pain and no weakness. Min guard A for safety.   Stairs            Wheelchair Mobility    Modified Rankin (Stroke Patients Only)       Balance Overall balance assessment: Needs  assistance Sitting-balance support: No upper extremity supported;Feet supported Sitting balance-Leahy Scale: Good     Standing balance support: No upper extremity supported;During functional activity Standing balance-Leahy Scale: Fair                               Pertinent Vitals/Pain Pain Assessment: No/denies pain    Home Living Family/patient expects to be discharged to:: Private residence Living Arrangements: Alone Available Help at Discharge: Other (Comment)(reports no one) Type of Home: Apartment Home Access: Stairs to enter Entrance Stairs-Rails: Right Entrance Stairs-Number of Steps: 4-6 Home Layout: One level Home Equipment: None      Prior Function Level of Independence: Independent               Hand Dominance        Extremity/Trunk Assessment   Upper Extremity Assessment Upper Extremity Assessment: Overall WFL for tasks assessed    Lower Extremity Assessment Lower Extremity Assessment: Generalized weakness    Cervical / Trunk Assessment Cervical / Trunk Assessment: Normal  Communication   Communication: No difficulties  Cognition Arousal/Alertness: Awake/alert Behavior During Therapy: WFL for tasks assessed/performed Overall Cognitive Status: No family/caregiver present to determine baseline cognitive functioning                                        General Comments General comments (skin integrity, edema, etc.): No family present  Exercises     Assessment/Plan    PT Assessment Patient needs continued PT services  PT Problem List Decreased strength;Decreased balance;Decreased mobility;Decreased knowledge of use of DME       PT Treatment Interventions Gait training;DME instruction;Stair training;Functional mobility training;Therapeutic activities;Therapeutic exercise;Balance training;Patient/family education    PT Goals (Current goals can be found in the Care Plan section)  Acute Rehab PT  Goals Patient Stated Goal: to go home PT Goal Formulation: With patient Time For Goal Achievement: 05/13/19 Potential to Achieve Goals: Good    Frequency Min 3X/week   Barriers to discharge Decreased caregiver support      Co-evaluation               AM-PAC PT "6 Clicks" Mobility  Outcome Measure Help needed turning from your back to your side while in a flat bed without using bedrails?: None Help needed moving from lying on your back to sitting on the side of a flat bed without using bedrails?: None Help needed moving to and from a bed to a chair (including a wheelchair)?: A Little Help needed standing up from a chair using your arms (e.g., wheelchair or bedside chair)?: A Little Help needed to walk in hospital room?: A Little Help needed climbing 3-5 steps with a railing? : A Lot 6 Click Score: 19    End of Session   Activity Tolerance: Patient tolerated treatment well Patient left: in bed;with call bell/phone within reach;with bed alarm set Nurse Communication: Mobility status PT Visit Diagnosis: Other abnormalities of gait and mobility (R26.89)    Time: 0539-7673 PT Time Calculation (min) (ACUTE ONLY): 15 min   Charges:   PT Evaluation $PT Eval Low Complexity: 1 Low          Lou Miner, DPT  Acute Rehabilitation Services  Pager: [phone] Office: [phone]   Rudean Hitt 04/29/2019, 4:46 PM

## 2019-04-30 DIAGNOSIS — I482 Chronic atrial fibrillation, unspecified: Secondary | ICD-10-CM

## 2019-04-30 LAB — BASIC METABOLIC PANEL
Anion gap: 10 (ref 5–15)
BUN: 46 mg/dL — ABNORMAL HIGH (ref 8–23)
CO2: 27 mmol/L (ref 22–32)
Calcium: 8.7 mg/dL — ABNORMAL LOW (ref 8.9–10.3)
Chloride: 99 mmol/L (ref 98–111)
Creatinine, Ser: 2.38 mg/dL — ABNORMAL HIGH (ref 0.61–1.24)
GFR calc Af Amer: 32 mL/min — ABNORMAL LOW (ref >60)
GFR calc non Af Amer: 28 mL/min — ABNORMAL LOW (ref >60)
Glucose, Bld: 118 mg/dL — ABNORMAL HIGH (ref 70–99)
Potassium: 4.1 mmol/L (ref 3.5–5.1)
Sodium: 136 mmol/L (ref 135–145)

## 2019-04-30 LAB — GLUCOSE, CAPILLARY
Glucose-Capillary: 112 mg/dL — ABNORMAL HIGH (ref 70–99)
Glucose-Capillary: 179 mg/dL — ABNORMAL HIGH (ref 70–99)
Glucose-Capillary: 153 mg/dL — ABNORMAL HIGH (ref 70–99)

## 2019-04-30 LAB — CBC
HCT: 39.8 % (ref 39.0–52.0)
Hemoglobin: 13 g/dL (ref 13.0–17.0)
MCH: 28.3 pg (ref 26.0–34.0)
MCHC: 32.7 g/dL (ref 30.0–36.0)
MCV: 86.7 fL (ref 80.0–100.0)
Platelets: 154 10*3/uL (ref 150–400)
RBC: 4.59 MIL/uL (ref 4.22–5.81)
RDW: 15.9 % — ABNORMAL HIGH (ref 11.5–15.5)
WBC: 3.6 10*3/uL — ABNORMAL LOW (ref 4.0–10.5)
nRBC: 0 % (ref 0.0–0.2)

## 2019-04-30 MED ORDER — FUROSEMIDE 40 MG PO TABS
40.0000 mg | ORAL_TABLET | Freq: Two times a day (BID) | ORAL | Status: DC
  Administered 2019-04-30 – 2019-05-01 (×2): 40 mg via ORAL
  Filled 2019-04-30 (×2): qty 1

## 2019-04-30 MED ORDER — CARVEDILOL 3.125 MG PO TABS
3.1250 mg | ORAL_TABLET | Freq: Two times a day (BID) | ORAL | Status: DC
  Administered 2019-04-30 – 2019-05-01 (×2): 3.125 mg via ORAL
  Filled 2019-04-30 (×2): qty 1

## 2019-04-30 NOTE — Progress Notes (Signed)
[facility] for Heparin >> Eliquis Indication: atrial fibrillation  No Known Allergies  Patient Measurements: Height: 5\' 9"  (175.3 cm) Weight: 82.6 kg (182 lb 1.6 oz)(scale b) IBW/kg (Calculated) : 70.7 Heparin Dosing Weight: 89.4 kg  Vital Signs: Temp: 97.9 F (36.6 C) (04/06 0406)  Labs: Recent Labs    04/28/19 0426 04/28/19 0426 04/29/19 0541 04/30/19 0431  HGB 13.1   < > 13.9 13.0  HCT 41.2  --  42.6 39.8  PLT 157  --  164 154  CREATININE 2.62*  --  2.51* 2.38*   < > = values in this interval not displayed.    Estimated Creatinine Clearance: 31.4 mL/min (A) (by C-G formula based on SCr of 2.38 mg/dL (H)).   Medical History: Past Medical History:  Diagnosis Date  . CHF (congestive heart failure) ([place])   . Diabetes mellitus without complication ([place])   . Hypertension     Assessment: 64 yo M with hx of atrial fibrillation with no AC PTA presents with SOB. Pt supposed to be on Xarelto but states he has not been taking the medication. [facility] on [facility]/17/19. Pharmacy consulted to transition from heparin to apixaban.   Apixaban initiated 4/3. H&H stable, no bleeding issues reported per RN.   Goal of Therapy:  Heparin level 0.3-0.7 units/ml Monitor platelets by anticoagulation protocol: Yes   Plan:  Continue apixaban 5 mg by mouth twice daily Monitor for s/sx's of bleeding   Nithin Demeo L. Devin Going, [place] PGY1 Pharmacy Resident [phone] 04/30/19      8:48 AM  Please check AMION for all [facility] phone numbers After 10:00 PM, call the [facility] [phone]

## 2019-04-30 NOTE — Progress Notes (Addendum)
PROGRESS NOTE    Eston Heslin  ZOX:[id number] DOB: 19-May-1955 DOA: 04/26/2019 PCP: Patient, No Pcp Per  Brief Narrative: 64 year old noncompliant male with history of chronic systolic CHF, type 2 diabetes mellitus, history of aortic valve replacement, ICD, remote history of seizure disorder, has not seen a doctor or taken any of his cardiac medications for well over a year presented to the ED with progressive dyspnea on exertion, orthopnea and lower extremity edema for 1 week. -Work-up in the emergency room noted elevated blood pressure, creatinine of 3.0, BNP of 780, chest x-ray with cardiomegaly, pleural effusion, questionable right basilar infiltrate. -Improving with diuresis, 2D echo noted EF of 25 to 30% with global hypokinesis -Cardiology consulted, improving with diuresis  Assessment & Plan:   Acute on chronic systolic congestive heart failure  History of AVR -Noncompliance with all cardiac meds for over 1 year -Clinically improving with diuresis, diuresed with IV Lasix - he is 7.5 L negative -2D echocardiogram with EF of 25 to 30%, global hypokinesis , which is a drop in EF from echo in 2019  -TSH is within normal limits  -Appreciate cardiology input, appears close to euvolemic, starting Coreg, change to oral Lasix today -Discharge planning, BMP in a.m.  Questionable right lower lobe infiltrate -Clinically I think this is all CHF related, he is afebrile white count is normal and procalcitonin is less than 0.1 -Antibiotics discontinued, remains stable  Acute renal failure superimposed on chronic kidney disease stage III:  -Patient presents with creatinine elevated up to 3.04 with BUN 32.  Creatinine previously noted to be around 1.57 back in 2019.  -Recent baseline unknown -Creatinine improving with diuresis, down to 2.3 today, urinalysis positive for moderate proteinuria -Suspect cardiorenal component given improvement with diuresis -Check BMP in a.m. Elevated troponin:  High-sensitivity troponins mildly elevated 73->71, suspect this is secondary to demand in the setting of CHF and CKD, No symptoms of ACS  Paroxysmal atrial fibrillation: Patient has been off of Xarelto. Appears currently rate controlled. -Started on Eliquis this admission given renal insufficiency -Case management consult  Status post AICD, history of aortic valve replacement:  -Part of his care was that [place], rest thought to be in [facility] in 2006 or 2007.  Diabetes mellitus type 2:  - Last hemoglobin A1c was 16.3 in 05/2017.  -Hemoglobin A1c is 8.8 now, CBG stable now continue sliding scale insulin -Recommend oral hypoglycemics at discharge  Recent scabies: . Patient on multiple scabs and lesions is all body. -Previously treated for same, I do not see active lesions now, evidence of a lot of postinflammatory hyperpigmentation seen -Continue contact precautions and permethrin cream   Essential hypertension:  -Blood pressure more stable, continue hydralazine and Imdur  -When euvolemic add beta-blocker   History of seizures: Patient reports that he has been off of [place] since 2019. Previously taking Keppra 500 mg daily. -No history of seizures recently, has not taken Keppra in 1 to 2 years -Will defer this to PCP  Tobacco abuse: Patient admits to smoking a pack cigarettes per day on average. -Counseled the patient on the need cessation of tobacco use. -Nicotine patch offered  DVT prophylaxis: eliquis Code Status: Full Family Communication:  Patient is estranged from his family, discussed with him extensively Disposition Plan:  Improving with diuresis, transition to oral diuretics today, home tomorrow if stable   Consult: Cardiology  Antimicrobials:    Subjective: -Breathing improving, not back to baseline yet, continues to have mild orthopnea and nonproductive cough  Objective: Vitals:   04/29/19 0500 04/29/19 0645 04/29/19 2010 04/30/19  0406  BP:  (!) 133/98 (!) 157/109   Pulse: 66  70   Resp: 19  17   Temp:   97.7 F (36.5 C) 97.9 F (36.6 C)  TempSrc:      SpO2: 100%  100%   Weight:    82.6 kg  Height:        Intake/Output Summary (Last 24 hours) at 04/30/2019 1440 Last data filed at 04/30/2019 1310 Gross per 24 hour  Intake 425 ml  Output 4260 ml  Net -3835 ml   Filed Weights   04/28/19 0519 04/29/19 0411 04/30/19 0406  Weight: 89.3 kg 86 kg 82.6 kg    Examination:  Gen: Chronically ill male sitting up in bed, AAOx3, no distress HEENT:no JVD Lungs: Decreased breath sounds bases, otherwise clear CVS: S1-S2, regular rhythm, systolic murmur Abd: soft, Non tender, non distended, BS present Extremities: Trace edema Skin: Extensive hyperpigmented lesions noted throughout his arms and legs especially abdominal wall Psychiatry:  Mood & affect appropriate.     Data Reviewed:   CBC: Recent Labs  Lab 04/26/19 1414 04/27/19 0215 04/28/19 0426 04/29/19 0541 04/30/19 0431  WBC 5.8 5.0 4.6 3.6* 3.6*  NEUTROABS 4.1  --   --   --   --   HGB 13.6 14.1 13.1 13.9 13.0  HCT 43.9 44.4 41.2 42.6 39.8  MCV 90.9 88.3 87.8 86.6 86.7  PLT 175 171 157 164 299   Basic Metabolic Panel: Recent Labs  Lab 04/26/19 1414 04/26/19 1620 04/27/19 0215 04/28/19 0426 04/29/19 0541 04/30/19 0431  NA 139  --  138 138 138 136  K 4.9  --  4.0 4.2 4.0 4.1  CL 107  --  105 105 101 99  CO2 20*  --  22 22 26 27   GLUCOSE 103*  --  202* 125* 114* 118*  BUN 32*  --  38* 40* 41* 46*  CREATININE 3.04*  --  2.97* 2.62* 2.51* 2.38*  CALCIUM 9.2  --  9.0 8.7* 8.7* 8.7*  MG  --  1.8  --   --   --   --    GFR: Estimated Creatinine Clearance: 31.4 mL/min (A) (by C-G formula based on SCr of 2.38 mg/dL (H)). Liver Function Tests: No results for input(s): AST, ALT, ALKPHOS, BILITOT, PROT, ALBUMIN in the last 168 hours. No results for input(s): LIPASE, AMYLASE in the last 168 hours. No results for input(s): AMMONIA in the last 168  hours. Coagulation Profile: Recent Labs  Lab 04/26/19 1835 04/27/19 0215  INR 1.4* 1.3*   Cardiac Enzymes: No results for input(s): CKTOTAL, CKMB, CKMBINDEX, TROPONINI in the last 168 hours. BNP (last 3 results) No results for input(s): PROBNP in the last 8760 hours. HbA1C: No results for input(s): HGBA1C in the last 72 hours. CBG: Recent Labs  Lab 04/29/19 1145 04/29/19 1559 04/29/19 2120 04/30/19 0611 04/30/19 1057  GLUCAP 194* 105* 226* 112* 179*   Lipid Profile: No results for input(s): CHOL, HDL, LDLCALC, TRIG, CHOLHDL, LDLDIRECT in the last 72 hours. Thyroid Function Tests: No results for input(s): TSH, T4TOTAL, FREET4, T3FREE, THYROIDAB in the last 72 hours. Anemia Panel: No results for input(s): VITAMINB12, FOLATE, FERRITIN, TIBC, IRON, RETICCTPCT in the last 72 hours. Urine analysis:    Component Value Date/Time   COLORURINE YELLOW 04/27/2019 1742   APPEARANCEUR CLEAR 04/27/2019 1742   LABSPEC 1.009 04/27/2019 1742   PHURINE 5.0 04/27/2019 1742  GLUCOSEU NEGATIVE 04/27/2019 1742   HGBUR SMALL (A) 04/27/2019 1742   BILIRUBINUR NEGATIVE 04/27/2019 1742   KETONESUR NEGATIVE 04/27/2019 1742   PROTEINUR 100 (A) 04/27/2019 1742   NITRITE NEGATIVE 04/27/2019 1742   LEUKOCYTESUR NEGATIVE 04/27/2019 1742   Sepsis Labs: @LABRCNTIP (procalcitonin:4,lacticidven:4)  ) Recent Results (from the past 240 hour(s))  SARS CORONAVIRUS 2 (TAT 6-24 HRS) Nasopharyngeal Nasopharyngeal Swab     Status: None   Collection Time: 04/26/19  2:53 PM   Specimen: Nasopharyngeal Swab  Result Value Ref Range Status   SARS Coronavirus 2 NEGATIVE NEGATIVE Final    Comment: (NOTE) SARS-CoV-2 target nucleic acids are NOT DETECTED. The SARS-CoV-2 RNA is generally detectable in upper and lower respiratory specimens during the acute phase of infection. Negative results do not preclude SARS-CoV-2 infection, do not rule out co-infections with other pathogens, and should not be used as  the sole basis for treatment or other patient management decisions. Negative results must be combined with clinical observations, patient history, and epidemiological information. The expected result is Negative. Fact Sheet for Patients: SugarRoll.be Fact Sheet for Healthcare Providers: https://www.woods-mathews.com/ This test is not yet approved or cleared by the [place] FDA and  has been authorized for detection and/or diagnosis of SARS-CoV-2 by FDA under an Emergency Use Authorization (EUA). This EUA will remain  in effect (meaning this test can be used) for the duration of the COVID-19 declaration under Section 56 4(b)(1) of the Act, 21 U.S.C. section 360bbb-3(b)(1), unless the authorization is terminated or revoked sooner. Performed at [facility], [place] [address], [place], [place] [address]   MRSA PCR Screening     Status: None   Collection Time: 04/26/19  6:55 PM   Specimen: Nasopharyngeal  Result Value Ref Range Status   MRSA by PCR NEGATIVE NEGATIVE Final    Comment:        The GeneXpert MRSA Assay (FDA approved for NASAL specimens only), is one component of a comprehensive MRSA colonization surveillance program. It is not intended to diagnose MRSA infection nor to guide or monitor treatment for MRSA infections. Performed at [facility], [place] [address], [place],  [address]      Radiology Studies: No results found.  Scheduled Meds: . apixaban  5 mg Oral BID  . carvedilol  3.125 mg Oral BID WC  . furosemide  40 mg Oral BID  . guaiFENesin  600 mg Oral BID  . hydrALAZINE  25 mg Oral Q8H  . insulin aspart  0-6 Units Subcutaneous TID WC  . isosorbide mononitrate  30 mg Oral Daily  . nicotine  21 mg Transdermal Daily  . pneumococcal 23 valent vaccine  0.5 mL Intramuscular Tomorrow-1000  . sodium chloride flush  3 mL Intravenous Q12H   Continuous Infusions: . sodium chloride 250 mL (04/26/19 1816)      LOS: 4 days    Time spent: 59min  Domenic Polite, MD [facility] 04/30/2019, 2:40 PM

## 2019-04-30 NOTE — Progress Notes (Signed)
Progress Note  Patient Name: Fleming Prill Date of Encounter: 04/30/2019  Primary Cardiologist: Hilty   Subjective   Doing well ambulated with PT   Inpatient Medications    Scheduled Meds: . apixaban  5 mg Oral BID  . carvedilol  3.125 mg Oral BID WC  . furosemide  40 mg Oral BID  . guaiFENesin  600 mg Oral BID  . hydrALAZINE  25 mg Oral Q8H  . insulin aspart  0-6 Units Subcutaneous TID WC  . isosorbide mononitrate  30 mg Oral Daily  . nicotine  21 mg Transdermal Daily  . pneumococcal 23 valent vaccine  0.5 mL Intramuscular Tomorrow-1000  . sodium chloride flush  3 mL Intravenous Q12H   Continuous Infusions: . sodium chloride 250 mL (04/26/19 1816)   PRN Meds: sodium chloride, acetaminophen, hydrALAZINE, ondansetron (ZOFRAN) IV, sodium chloride flush   Vital Signs    Vitals:   04/29/19 0500 04/29/19 0645 04/29/19 2010 04/30/19 0406  BP:  (!) 133/98 (!) 157/109   Pulse: 66  70   Resp: 19  17   Temp:   97.7 F (36.5 C) 97.9 F (36.6 C)  TempSrc:      SpO2: 100%  100%   Weight:    82.6 kg  Height:        Intake/Output Summary (Last 24 hours) at 04/30/2019 1005 Last data filed at 04/30/2019 0927 Gross per 24 hour  Intake 425 ml  Output 4100 ml  Net -3675 ml   Filed Weights   04/28/19 0519 04/29/19 0411 04/30/19 0406  Weight: 89.3 kg 86 kg 82.6 kg    Physical Exam   Affect appropriate Chronically ill black male  HEENT: normal Neck supple with no adenopathy JVP normal no bruits no thyromegaly Lungs clear with no wheezing and good diaphragmatic motion Heart:  S1/S2 SEM  murmur, no rub, gallop or click PMI normal post sternotomy PPM under left clavicle  Abdomen: benighn, BS positve, no tenderness, no AAA no bruit.  No HSM or HJR Distal pulses intact with no bruits Trace bilateral edema Neuro non-focal Skin warm and dry hyperpigmented from scabies recently healed  No muscular weakness    Labs    Chemistry Recent Labs  Lab 04/28/19 0426  04/29/19 0541 04/30/19 0431  NA 138 138 136  K 4.2 4.0 4.1  CL 105 101 99  CO2 22 26 27   GLUCOSE 125* 114* 118*  BUN 40* 41* 46*  CREATININE 2.62* 2.51* 2.38*  CALCIUM 8.7* 8.7* 8.7*  GFRNONAA 25* 26* 28*  GFRAA 29* 30* 32*  ANIONGAP 11 11 10      Hematology Recent Labs  Lab 04/28/19 0426 04/29/19 0541 04/30/19 0431  WBC 4.6 3.6* 3.6*  RBC 4.69 4.92 4.59  HGB 13.1 13.9 13.0  HCT 41.2 42.6 39.8  MCV 87.8 86.6 86.7  MCH 27.9 28.3 28.3  MCHC 31.8 32.6 32.7  RDW 15.8* 15.7* 15.9*  PLT 157 164 154    Cardiac EnzymesNo results for input(s): TROPONINI in the last 168 hours. No results for input(s): TROPIPOC in the last 168 hours.   BNP Recent Labs  Lab 04/26/19 1414  BNP 780.7*     DDimer No results for input(s): DDIMER in the last 168 hours.   Radiology    No results found. Telemetry    04/29/19 V- paced with rates in the 60-70's - Personally Reviewed  ECG    No new tracing as of 04/29/19 - Personally Reviewed  Cardiac Studies  Echocardiogram 04/27/2019:    1. Left ventricular ejection fraction, by estimation, is 25 to 30%. The  left ventricle has severely decreased function. The left ventricle  demonstrates global hypokinesis. There is mild left ventricular  hypertrophy. Septal, distal anteroseptal and  apical dyskinesis.   2. Right ventricular systolic function is mildly reduced. The right  ventricular size is moderately enlarged. An AICD wire is visualized. There  is moderately elevated pulmonary artery systolic pressure.   3. The mitral valve is abnormal. Trivial mitral valve regurgitation.   4. The tricuspid valve is abnormal. Tricuspid valve regurgitation is  moderate.   5. The aortic valve has been repaired/replaced. Aortic valve  regurgitation is not visualized. Moderate aortic valve stenosis. There is  a bioprosthetic valve present in the aortic position. Aortic valve mean  gradient measures 14.0 mmHg.   6. Left atrial size was moderately  dilated.   7. Right atrial size was moderately dilated.   Comparison(s): Changes from prior study are noted. 06/24/2017: LVEF 45-50%.   Patient Profile     64 y.o. male with history of systolic CHF, admitted with acute on chronic systolic CHF and reduced LVEF on echo, prior AICD, history of diabetes and hypertension and afib   Assessment & Plan    1.  Acute on chronic systolic CHF: -Echocardiogram performed this admission shows newly reduced LVEF at 25 to 30% with global hypokinesis Had been non compliant with meds. Good diuresis Cr improving Change to PO lasix and add low dose coreg  2.  History of aortic valve replacement: -Noted on most recent echocardiogram with stable functioning bioprosthetic valve with a mean gradient of 14 mmHg no PVL  SBE prophylaxis   3. s/p AICD: -[employer] Interrogation today normal function    4.  Atrial fibrillation/flutter: - with V pacing Device interrogation shows chronic afib over 400 days Given this and moderate biatrial enlargement on TTE does not appear to be a candidate for [facility] Continue eliquis Rates fine and coreg being added for CHF as he has pacer back up   5.  Uncontrolled hypertension: -Improved, no ACE/ARB/Entresto with CRF   6.  Acute on chronic renal insufficiency: -Creatinine, 2.38  Changing to PO lasix  -Avoid nephrotoxic medications  7.  Elevated troponin: -Flat trend, 73>> 71 -Suspect demand ischemia secondary to acute HF, ARF -Denies CP  From cardiology perspective ready for DC ? Placement will arrange outpatient f/u  With Dr Debara Pickett and EP  Jenkins Rouge MD [facility]

## 2019-04-30 NOTE — Care Management Important Message (Signed)
Important Message  Patient Details  Name: [name] MRN: [id number] Date of Birth: Jun 18, 1955   Medicare Important Message Given:  Yes     Zenon Mayo, RN 04/30/2019, 3:22 PM

## 2019-05-01 ENCOUNTER — Telehealth: Payer: Self-pay | Admitting: Internal Medicine

## 2019-05-01 DIAGNOSIS — Z952 Presence of prosthetic heart valve: Secondary | ICD-10-CM

## 2019-05-01 LAB — CBC
HCT: 41.2 % (ref 39.0–52.0)
Hemoglobin: 13.1 g/dL (ref 13.0–17.0)
MCH: 27.9 pg (ref 26.0–34.0)
MCHC: 31.8 g/dL (ref 30.0–36.0)
MCV: 87.8 fL (ref 80.0–100.0)
Platelets: 149 10*3/uL — ABNORMAL LOW (ref 150–400)
RBC: 4.69 MIL/uL (ref 4.22–5.81)
RDW: 15.9 % — ABNORMAL HIGH (ref 11.5–15.5)
WBC: 3.6 10*3/uL — ABNORMAL LOW (ref 4.0–10.5)
nRBC: 0 % (ref 0.0–0.2)

## 2019-05-01 LAB — BASIC METABOLIC PANEL
Anion gap: 11 (ref 5–15)
BUN: 47 mg/dL — ABNORMAL HIGH (ref 8–23)
CO2: 23 mmol/L (ref 22–32)
Calcium: 8.7 mg/dL — ABNORMAL LOW (ref 8.9–10.3)
Chloride: 102 mmol/L (ref 98–111)
Creatinine, Ser: 2.3 mg/dL — ABNORMAL HIGH (ref 0.61–1.24)
GFR calc Af Amer: 34 mL/min — ABNORMAL LOW (ref >60)
GFR calc non Af Amer: 29 mL/min — ABNORMAL LOW (ref >60)
Glucose, Bld: 162 mg/dL — ABNORMAL HIGH (ref 70–99)
Potassium: 5.1 mmol/L (ref 3.5–5.1)
Sodium: 136 mmol/L (ref 135–145)

## 2019-05-01 LAB — GLUCOSE, CAPILLARY
Glucose-Capillary: 158 mg/dL — ABNORMAL HIGH (ref 70–99)
Glucose-Capillary: 156 mg/dL — ABNORMAL HIGH (ref 70–99)
Glucose-Capillary: 130 mg/dL — ABNORMAL HIGH (ref 70–99)

## 2019-05-01 MED ORDER — APIXABAN 5 MG PO TABS: 5.0000 mg | ORAL_TABLET | Freq: Two times a day (BID) | ORAL | 0 refills | Status: DC

## 2019-05-01 MED ORDER — GLIPIZIDE 5 MG PO TABS: 5.0000 mg | ORAL_TABLET | Freq: Every day | ORAL | 0 refills | Status: DC

## 2019-05-01 MED ORDER — ATORVASTATIN CALCIUM 10 MG PO TABS: 10.0000 mg | ORAL_TABLET | Freq: Every day | ORAL | 11 refills | Status: AC

## 2019-05-01 MED ORDER — CARVEDILOL 3.125 MG PO TABS: 3.1250 mg | ORAL_TABLET | Freq: Two times a day (BID) | ORAL | 0 refills | Status: DC

## 2019-05-01 MED ORDER — ISOSORBIDE MONONITRATE ER 30 MG PO TB24: 30.0000 mg | ORAL_TABLET | Freq: Every day | ORAL | 0 refills | Status: AC

## 2019-05-01 MED ORDER — HYDRALAZINE HCL 25 MG PO TABS: 25.0000 mg | ORAL_TABLET | Freq: Three times a day (TID) | ORAL | 0 refills | Status: DC

## 2019-05-01 MED ORDER — FUROSEMIDE 40 MG PO TABS: 40.0000 mg | ORAL_TABLET | Freq: Two times a day (BID) | ORAL | 0 refills | Status: DC

## 2019-05-01 MED FILL — ELIQUIS 5 MG TABLET: 5 | 30 days supply | Qty: 60 | Fill #0

## 2019-05-01 NOTE — TOC Transition Note (Addendum)
Transition of Care [facility]) - CM/SW Discharge Note   Patient Details  Name: Kyle Newton MRN: [id number] Date of Birth: 1955/03/25  Transition of Care [facility]) CM/SW Contact:  Zenon Mayo, RN Phone Number: 05/01/2019, 12:17 PM   Clinical Narrative:    Patient is for dc today, he states he has not been to the [place] in a very long time he does not know if he has a PCP or not.  He states he would like apt at the [facility] clinic.  He will also be on eliquis, NCM gave him a 30 day free coupon card.  NCM offered choice for HHRN, HHPT, he states he does not have a preference.  NCM made referral to [place] [address] with [facility].  She states she can take referral.  Soc will begin 24 to 48 hrs post dc.   NCM called April with [facility] and left [facility] to [facility]  [facility] states [facility] goes to [facility] , will call them as well.   12/42 NCM received call from April with the [facility] and she states patient does have a PCP Dr. Dani Gobble at the [facility], [place] is [name], pager [phone], phone [phone] ext [phone].  Patient will need to call and scheduled a follow up apt. NCM asked MD to send eliquis to [facility] pharmacy to get that filled , they will bring to room priro to dc. NCM informed Risk manager.   Final next level of care: [place] Barriers to Discharge: No Barriers Identified   Patient Goals and CMS Choice Patient states their goals for this hospitalization and ongoing recovery are:: get better CMS Medicare.gov Compare Post Acute Care list provided to:: Patient Choice offered to / list presented to : Patient  Discharge Placement                       Discharge Plan and Services                  DME Agency: NA       HH Arranged: RN, PT, Disease Management [place] Agency: [facility] [employer] Date [place]: 05/01/19 Time [place]: 1217 Representative spoke with at [place]: [place]) Interventions     Readmission Risk Interventions No flowsheet data found.

## 2019-05-01 NOTE — Progress Notes (Addendum)
Progress Note  Patient Name: Kyle Newton Date of Encounter: 05/01/2019  Primary Cardiologist: New [facility] (Dr. Debara Pickett)  Subjective   No acute overnight events. Patient doing well this morning. Denies any chest pain. Thinks shortness of breath is back to normal. No palpitations.  Inpatient Medications    Scheduled Meds:  apixaban  5 mg Oral BID   carvedilol  3.125 mg Oral BID WC   furosemide  40 mg Oral BID   guaiFENesin  600 mg Oral BID   hydrALAZINE  25 mg Oral Q8H   insulin aspart  0-6 Units Subcutaneous TID WC   isosorbide mononitrate  30 mg Oral Daily   nicotine  21 mg Transdermal Daily   sodium chloride flush  3 mL Intravenous Q12H   Continuous Infusions:  sodium chloride 250 mL (04/26/19 1816)   PRN Meds: sodium chloride, acetaminophen, hydrALAZINE, ondansetron (ZOFRAN) IV, sodium chloride flush   Vital Signs    Vitals:   05/01/19 0725 05/01/19 0730 05/01/19 0735 05/01/19 0805  BP:    (!) 138/95  Pulse: (!) 59 72 68 62  Resp:      Temp:      TempSrc:      SpO2: 98% 98% 97% 99%  Weight:      Height:        Intake/Output Summary (Last 24 hours) at 05/01/2019 0909 Last data filed at 05/01/2019 0838 Gross per 24 hour  Intake 805 ml  Output 3077 ml  Net -2272 ml   Last 3 Weights 05/01/2019 04/30/2019 04/29/2019  Weight (lbs) 177 lb 8 oz 182 lb 1.6 oz 189 lb 8 oz  Weight (kg) 80.513 kg 82.6 kg 85.957 kg      Telemetry    Ventricular paced rhythm with rates in the 60's to 80's.  - Personally Reviewed  ECG    No new ECG tracing today. - Personally Reviewed  Physical Exam   GEN: No acute distress.   Neck: Supple. No JVD. Cardiac: RRR. III/VI systolic murmur heard best at upper sternal border.  Respiratory: No increased work of breathing. Very faint crackles noted bilateral bases. Otherwise, lungs clear.  GI: Soft, non-distended, and non-tender. Bowel sounds present. Skin: Warm and dry. MS: No lower extremity edema. No deformity. Neuro:  No focal  deficits.  Psych: Normal affect. Responds appropriately.   Labs    High Sensitivity Troponin:   Recent Labs  Lab 04/26/19 1414 04/26/19 1620  TROPONINIHS 73* 71*      Chemistry Recent Labs  Lab 04/29/19 0541 04/30/19 0431 05/01/19 0345  NA 138 136 136  K 4.0 4.1 5.1  CL 101 99 102  CO2 26 27 23   GLUCOSE 114* 118* 162*  BUN 41* 46* 47*  CREATININE 2.51* 2.38* 2.30*  CALCIUM 8.7* 8.7* 8.7*  GFRNONAA 26* 28* 29*  GFRAA 30* 32* 34*  ANIONGAP 11 10 11      Hematology Recent Labs  Lab 04/29/19 0541 04/30/19 0431 05/01/19 0345  WBC 3.6* 3.6* 3.6*  RBC 4.92 4.59 4.69  HGB 13.9 13.0 13.1  HCT 42.6 39.8 41.2  MCV 86.6 86.7 87.8  MCH 28.3 28.3 27.9  MCHC 32.6 32.7 31.8  RDW 15.7* 15.9* 15.9*  PLT 164 154 149*    BNP Recent Labs  Lab 04/26/19 1414  BNP 780.7*     DDimer No results for input(s): DDIMER in the last 168 hours.   Radiology    No results found.  Cardiac Studies   Echocardiogram 04/27/2019: Impressions: 1.  Left ventricular ejection fraction, by estimation, is 25 to 30%. The  left ventricle has severely decreased function. The left ventricle  demonstrates global hypokinesis. There is mild left ventricular  hypertrophy. Septal, distal anteroseptal and  apical dyskinesis.   2. Right ventricular systolic function is mildly reduced. The right  ventricular size is moderately enlarged. An AICD wire is visualized. There  is moderately elevated pulmonary artery systolic pressure.   3. The mitral valve is abnormal. Trivial mitral valve regurgitation.   4. The tricuspid valve is abnormal. Tricuspid valve regurgitation is  moderate.   5. The aortic valve has been repaired/replaced. Aortic valve  regurgitation is not visualized. Moderate aortic valve stenosis. There is  a bioprosthetic valve present in the aortic position. Aortic valve mean  gradient measures 14.0 mmHg.   6. Left atrial size was moderately dilated.   7. Right atrial size was moderately  dilated.   Comparison(s): Changes from prior study are noted. 06/24/2017: LVEF 45-50%.  Patient Profile   Kyle Newton is a 64 y.o. male with a history of systolic CHF s/p ICD, hypertension, and diabetes mellitus, who is being seen for acute on chronic CHF.   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  - BNP elevated at 780. - Chest x-ray showed cardiomegaly with right base infiltrate and moderate right pleural effusion.  - Echo this admission showed LVEF of 25-30% with global hypokinesis and septa, distal anteroseptal, and apical dyskinesis as well as grade III diastolic dysfunction. RV also moderately enlarged with mildly reduced systolic function. Moderate TR. - Initially diuresed with IV Lasix but switched to PO Lasix 40mg  twice daily yesterday. Documented urinary output of 3.2 in the past 24 hours and net negative 9.9 L since admission. Weight 177.5 lbs today, down 5lbs since yesterday and 25 lbs since admission. Renal function stable.  - Patient appears euvolemic today.  - Continue current dose of PO Lasix.  - No ACEi/ARB/Entresto or Spironolactone due to renal function. - Continue Coreg 3.125mg  twice daily.  - Continue Imdur 30mg  daily.  - Continue daily weights, strict I/O's, and renal function.  Elevated Troponin  - High-sensitivity troponin mildly elevated and flat at 73 >> 71.  - No chest pain.  - Felt to be demand ischemia secondary to acute on chronic CHF and AKI.   History of Aortic Valve Replacement - Echo this admission showed moderate AS. Mean gradient of 14.0 mmHg. Peak gradient of 29.9 mmHg. Aortic valve area by VTI measures 0.55 cm^2.  - Continue to monitor as outpatient.   Chronic Atrial Fibrillation/Flutter - Device interrogated this admission and showed chronic atrial fibrillation over the last 400 days. Given this and moderate biatrial  enlargement on Echo, felt to not be a good candidate for DCCV.  - Continue Coreg 3.125mg  twice daily. - Continue chronic anticoagulation with Eliquis 5mg  twice daily.   S/p Medtronic ICD - Interrogated this admission and showed normal function.  Hypertension - Improved but still elevated. Most recent BP 138/95 this morning before any medications.  - Continue Coreg and Imdur as above.  Acute on Chronic Kidney Disease  - Creatinine 3.04 on admission. Slowly improving and 2.30 today. Baseline around 1.6 to 2.0.  - Continue to avoid Nephrotoxic agents.  - Continue to monitor.   Otherwise, per primary team. - Type 2 diabetes mellitus - Tobacco abuse - Recent scabies - History of  seizures  For questions or updates, please contact [place] Please consult [email or web address] for contact info under        Signed, Darreld Mclean, PA-C  05/01/2019, 9:09 AM    Patient examined chart reviewed. Lungs clear PPM under left clavicle SEM through AVR no AR no edema hyperpigmented skin from recent scabies infection Chronic afib with good rate control and anticoagulation. Normal Medtronic PPM function Normal functioning AVR by TTE Compensated CHF medical Rx limited by renal failure  that has improved Good diuresis and weight down 12 lbs.    Have arranged outpatient f/u with Dr Debara Pickett and [facility]  Cardiology will sign off  Jenkins Rouge MD [facility]

## 2019-05-01 NOTE — Care Management Important Message (Signed)
Important Message  Patient Details  Name: Kyle Newton MRN: [id number] Date of Birth: 1955/03/30   Medicare Important Message Given:  Yes     Zenon Mayo, RN 05/01/2019, 11:39 AM

## 2019-05-01 NOTE — Telephone Encounter (Signed)
TOC on  05/09/19 at 2:15pm with Almyra Deforest per staff message from Dr. Johnsie Cancel

## 2019-05-01 NOTE — Telephone Encounter (Signed)
Patient was discharged today-  TOC set for tomorrow 04/08

## 2019-05-01 NOTE — Telephone Encounter (Signed)
TOC on 05/08/19 at 9:40am per staff message from Dr. Johnsie Cancel.

## 2019-05-01 NOTE — Discharge Summary (Signed)
Physician Discharge Summary  Kyle Newton ZES:[id number] DOB: 02-Feb-1955 DOA: 04/26/2019  PCP: Patient, No Pcp Per  Admit date: 04/26/2019 Discharge date: 05/01/2019  Admitted From: Home Disposition: Home  Recommendations for Outpatient Follow-up:  1. Follow up with PCP in 1-2 weeks 2. Follow-up with cardiology, Dr. Debara Pickett as scheduled 3. Follow-up with cardiology, Dr. Caryl Comes as scheduled 4. Please obtain BMP/CBC in one week  Home Health: PT/RN Equipment/Devices: None  Discharge Condition: Stable CODE STATUS: Full code Diet recommendation: Heart healthy, consistent carbohydrate diet  History of present illness:  Kyle Newton is a 64 year old noncompliant male with history of chronic systolic CHF, type 2 diabetes mellitus, history of aortic valve replacement, ICD, remote history of seizure disorder, has not seen a doctor or taken any of his cardiac medications for well over a year presented to the ED with progressive dyspnea on exertion, orthopnea and lower extremity edema for 1 week.  Work-up in the emergency room noted elevated blood pressure, creatinine of 3.0, BNP of 780, chest x-ray with cardiomegaly, pleural effusion, questionable right basilar infiltrate.   Hospital course:  Acute on chronic systolic congestive heart failure s/p ICD Presenting with progressive dyspnea, lower extremity edema for 1 week.  He has been noncompliant with all of his cardiac meds for roughly 1 year.  Cardiology was consulted and followed during hospital course.  Started on diuresis with IV furosemide with good output, net -10 L during hospitalization.  TTE notable for EF 25-30% with global hypokinesis, which is a decrease in EF from his previous echo dated 2019.  TSH within normal limits.  Patient will discharge on carvedilol to 5 mg p.o. twice daily, Imdur 30 mg p.o. daily, furosemide 40 mg p.o. twice daily.  Holding off initiating ACE inhibitor until renal function improves.  Has follow-up scheduled with  cardiology following discharge.  Questionable right lower lobe infiltrate: Pneumonia ruled out Initially started on empiric antibiotics.  Clinically I think this is all CHF related, he is afebrile white count is normal and procalcitonin is less than 0.1. Antibiotics discontinued.  And afebrile without leukocytosis.  Acute renal failure superimposed on chronic kidney disease stage III:  Patient presents with creatinine elevated up to 3.04 with BUN 32. Urinalysis with moderate proteinuria.  Creatinine previously noted to be around 1.57 back in 2019, recent baseline unknown.  Creatinine improved with diuresis, was 2.30 at time of discharge. Suspect cardiorenal component given improvement with diuresis.  Discharging on furosemide 40 mg p.o. twice daily as above.  Recommend repeat renal function in 1-2 weeks.  Elevated troponin:  High-sensitivity troponins mildly elevated 73->71, suspect this is secondary to demand in the setting of CHF and CKD, No symptoms of ACS.  History of Aortic Valve Replacement TTE this admission showed moderate AS. Mean gradient of 14.0 mmHg. Peak gradient of 29.9 mmHg. Aortic valve area by VTI measures 0.55 cm^2.  Continue outpatient follow-up with cardiology.  Paroxysmal atrial fibrillation/flutter:  Patient's ICD was interrogated and noted chronic atrial fibrillation of the past 400 days, has moderate biatrial enlargement on echocardiogram, not a good candidate for DCCV per cardiology.  We will continue rate control with carvedilol 3.125 mg p.o. twice daily and anticoagulation with Eliquis.  Outpatient follow-up with cardiology.  Diabetes mellitus type 2:  Hemoglobin A1c 8.8, improved from 16.3 in May 2019.  Will initiate glipizide 5 mg p.o. daily on discharge.  Patient instructed on consistent carbohydrate diet.  Avoiding Metformin with renal insufficiency.  Essential hypertension:  Continue carvedilol 3.125 mg p.o. twice daily,  hydralazine 25 mg p.o. every 8 hours,  Imdur 30 mg p.o. daily, furosemide 40 mg p.o. twice daily.  History of seizures:  Patient reports that he has been off of [place] since 2019. Previously taking Keppra 500 mg daily. No history of seizures recently, has not taken Keppra in 1 to 2 years.  Will defer further management per PCP versus neurology.  Tobacco abuse:  Patient admits to smoking a pack cigarettes per day on average. Counseled the patient on the need cessation of tobacco use.  Discharge Diagnoses:  Principal Problem:   Acute exacerbation of CHF (congestive heart failure) (HCC) Active Problems:   Scabies   ICD (implantable cardioverter-defibrillator) in place   History of seizures   Diabetes mellitus type 2 with complications ([place])   Community acquired pneumonia   Acute renal failure superimposed on stage 3a chronic kidney disease (HCC)   Atrial fibrillation (HCC)   Elevated troponin    Discharge Instructions  Discharge Instructions    (HEART FAILURE PATIENTS) Call MD:  Anytime you have any of the following symptoms: 1) 3 pound weight gain in 24 hours or 5 pounds in 1 week 2) shortness of breath, with or without a dry hacking cough 3) swelling in the hands, feet or stomach 4) if you have to sleep on extra pillows at night in order to breathe.   Complete by: As directed    Call MD for:  difficulty breathing, headache or visual disturbances   Complete by: As directed    Call MD for:  extreme fatigue   Complete by: As directed    Call MD for:  persistant dizziness or light-headedness   Complete by: As directed    Call MD for:  persistant nausea and vomiting   Complete by: As directed    Call MD for:  severe uncontrolled pain   Complete by: As directed    Call MD for:  temperature >100.4   Complete by: As directed    Diet - low sodium heart healthy   Complete by: As directed    Increase activity slowly   Complete by: As directed      Allergies as of 05/01/2019   No Known Allergies     Medication List     STOP taking these medications   citalopram 40 MG tablet Commonly known as: CELEXA   escitalopram 10 MG tablet Commonly known as: LEXAPRO   hydrocortisone cream 1 %   insulin aspart 100 UNIT/ML FlexPen Commonly known as: NovoLOG FlexPen   insulin glargine 100 UNIT/ML Solostar Pen Commonly known as: Lantus SoloStar   levETIRAcetam 500 MG tablet Commonly known as: KEPPRA   loratadine 10 MG tablet Commonly known as: CLARITIN   Rivaroxaban 15 MG Tabs tablet Commonly known as: XARELTO     TAKE these medications   apixaban 5 MG Tabs tablet Commonly known as: ELIQUIS Take 1 tablet (5 mg total) by mouth 2 (two) times daily.   atorvastatin 10 MG tablet Commonly known as: Lipitor Take 1 tablet (10 mg total) by mouth daily. What changed: Another medication with the same name was removed. Continue taking this medication, and follow the directions you see here.   carvedilol 3.125 MG tablet Commonly known as: COREG Take 1 tablet (3.125 mg total) by mouth 2 (two) times daily with a meal.   furosemide 40 MG tablet Commonly known as: LASIX Take 1 tablet (40 mg total) by mouth 2 (two) times daily.   glipiZIDE 5 MG tablet Commonly known as: GLUCOTROL  Take 1 tablet (5 mg total) by mouth daily before breakfast.   hydrALAZINE 25 MG tablet Commonly known as: APRESOLINE Take 1 tablet (25 mg total) by mouth every 8 (eight) hours.   isosorbide mononitrate 30 MG 24 hr tablet Commonly known as: IMDUR Take 1 tablet (30 mg total) by mouth daily. Start taking on: May 02, 2019   Semaglutide(0.25 or 0.5MG /DOS) 2 MG/1.5ML Sopn Inject 0.5 mg into the skin once a week.       No Known Allergies  Consultations:  Cardiology   Procedures/Studies: DG Chest 2 View  Result Date: 04/26/2019 CLINICAL DATA:  Shortness of breath. EXAM: CHEST - 2 VIEW COMPARISON:  06/17/2017. FINDINGS: Prior CABG. Cardiac pacer with lead tip over the right atrium and right ventricle. Prior cardiac valve  replacement. Cardiomegaly. No pulmonary venous congestion. Right base infiltrate. Moderate right pleural effusion. No pneumothorax. IMPRESSION: 1. Prior CABG. Cardiac pacer with lead tip over the right atrium right ventricle. Cardiomegaly. No pulmonary venous congestion. 2.  Right base infiltrate.  Moderate right pleural effusion. Electronically Signed   By: Marcello Moores  Register   On: 04/26/2019 14:23   ECHOCARDIOGRAM COMPLETE  Result Date: 04/27/2019    ECHOCARDIOGRAM REPORT   Patient Name:   Artyom Martucci Date of Exam: 04/27/2019 Medical Rec #:  [id number]     Height:       69.0 in Accession #:    [id number]    Weight:       203.1 lb Date of Birth:  01-Sep-1955    BSA:          2.080 m Patient Age:    35 years      BP:           156/102 mmHg Patient Gender: M             HR:           60 bpm. Exam Location:  Inpatient Procedure: 2D Echo, Cardiac Doppler and Color Doppler Indications:    I50.33 Acute on chronic diastolic (congestive) heart failure  History:        Patient has prior history of Echocardiogram examinations, most                 recent 06/24/2017. Defibrillator, Aortic Valve Disease; Risk                 Factors:Hypertension and Diabetes.                 Aortic Valve: bioprosthetic valve is present in the aortic                 position.  Sonographer:    Tiffany Dance Referring Phys: [id number] RONDELL A SMITH IMPRESSIONS  1. Left ventricular ejection fraction, by estimation, is 25 to 30%. The left ventricle has severely decreased function. The left ventricle demonstrates global hypokinesis. There is mild left ventricular hypertrophy. Septal, distal anteroseptal and apical dyskinesis.  2. Right ventricular systolic function is mildly reduced. The right ventricular size is moderately enlarged. An AICD wire is visualized. There is moderately elevated pulmonary artery systolic pressure.  3. The mitral valve is abnormal. Trivial mitral valve regurgitation.  4. The tricuspid valve is abnormal. Tricuspid valve  regurgitation is moderate.  5. The aortic valve has been repaired/replaced. Aortic valve regurgitation is not visualized. Moderate aortic valve stenosis. There is a bioprosthetic valve present in the aortic position. Aortic valve mean gradient measures 14.0 mmHg.  6. Left atrial size was moderately dilated.  7. Right atrial size was moderately dilated. Comparison(s): Changes from prior study are noted. 06/24/2017: LVEF 45-50%. FINDINGS  Left Ventricle: Left ventricular ejection fraction, by estimation, is 25 to 30%. The left ventricle has severely decreased function. The left ventricle demonstrates global hypokinesis. The left ventricular internal cavity size was normal in size. There is mild left ventricular hypertrophy. Left ventricular diastolic parameters are consistent with Grade III diastolic dysfunction (restrictive). Elevated left ventricular end-diastolic pressure. Right Ventricle: The right ventricular size is moderately enlarged. No increase in right ventricular wall thickness. Right ventricular systolic function is mildly reduced. There is moderately elevated pulmonary artery systolic pressure. The tricuspid regurgitant velocity is 2.52 m/s, and with an assumed right atrial pressure of 15 mmHg, the estimated right ventricular systolic pressure is 10.6 mmHg. Left Atrium: Left atrial size was moderately dilated. Right Atrium: Right atrial size was moderately dilated. Pericardium: Trivial pericardial effusion is present. The pericardial effusion is circumferential. Mitral Valve: The mitral valve is abnormal. Moderate to severe mitral annular calcification. Trivial mitral valve regurgitation. Tricuspid Valve: The tricuspid valve is abnormal. Tricuspid valve regurgitation is moderate. Aortic Valve: The aortic valve has been repaired/replaced. Aortic valve regurgitation is not visualized. Moderate aortic stenosis is present. Aortic valve mean gradient measures 14.0 mmHg. Aortic valve peak gradient measures 29.9  mmHg. Aortic valve area,  by VTI measures 0.55 cm. There is a bioprosthetic valve present in the aortic position. Pulmonic Valve: The pulmonic valve was grossly normal. Pulmonic valve regurgitation is not visualized. Aorta: The aortic root and ascending aorta are structurally normal, with no evidence of dilitation. IAS/Shunts: The interatrial septum was not well visualized. Additional Comments: A AICD wire is visualized.  LEFT VENTRICLE PLAX 2D LVIDd:         5.57 cm  Diastology LVIDs:         5.01 cm  LV e' lateral:   6.53 cm/s LV PW:         1.05 cm  LV E/e' lateral: 23.6 LV IVS:        1.07 cm  LV e' medial:    3.16 cm/s LVOT diam:     2.10 cm  LV E/e' medial:  48.7 LV SV:         37 LV SV Index:   18 LVOT Area:     3.46 cm  RIGHT VENTRICLE            IVC RV Basal diam:  3.37 cm    IVC diam: 3.70 cm RV Mid diam:    3.20 cm RV S prime:     3.30 cm/s TAPSE (M-mode): 0.8 cm LEFT ATRIUM             Index       RIGHT ATRIUM           Index LA diam:        4.50 cm 2.16 cm/m  RA Area:     27.50 cm LA Vol (A2C):   81.7 ml 39.29 ml/m RA Volume:   107.00 ml 51.45 ml/m LA Vol (A4C):   54.5 ml 26.21 ml/m LA Biplane Vol: 68.3 ml 32.84 ml/m  AORTIC VALVE AV Area (Vmax):    0.70 cm AV Area (Vmean):   0.56 cm AV Area (VTI):     0.55 cm AV Vmax:           273.60 cm/s AV Vmean:          230.000 cm/s AV VTI:  0.670 m AV Peak Grad:      29.9 mmHg AV Mean Grad:      14.0 mmHg LVOT Vmax:         55.00 cm/s LVOT Vmean:        36.900 cm/s LVOT VTI:          0.107 m LVOT/AV VTI ratio: 0.16  AORTA Ao Root diam: 3.70 cm Ao Asc diam:  3.70 cm MITRAL VALVE                TRICUSPID VALVE MV Area (PHT): 5.97 cm     TR Peak grad:   25.4 mmHg MV Decel Time: 127 msec     TR Vmax:        252.00 cm/s MV E velocity: 154.00 cm/s MV A velocity: 50.80 cm/s   SHUNTS MV E/A ratio:  3.03         Systemic VTI:  0.11 m                             Systemic Diam: 2.10 cm Lyman Bishop MD Electronically signed by Lyman Bishop MD  Signature Date/Time: 04/27/2019/12:26:49 PM    Final       Subjective: Patient seen and examined bedside, resting comfortably.  Patient states breathing much improved.  No specific complaints this morning.  Cardiology now signed off.  Ready for discharge home.  Denies headache, no fever/chills/night sweats, no nausea/vomiting/diarrhea, no chest pain, no palpitations, no shortness of breath, no weakness, no fatigue, no paresthesias.  No acute events overnight per nursing staff.   Discharge Exam: Vitals:   05/01/19 0735 05/01/19 0805  BP:  (!) 138/95  Pulse: 68 62  Resp:    Temp:    SpO2: 97% 99%   Vitals:   05/01/19 0725 05/01/19 0730 05/01/19 0735 05/01/19 0805  BP:    (!) 138/95  Pulse: (!) 59 72 68 62  Resp:      Temp:      TempSrc:      SpO2: 98% 98% 97% 99%  Weight:      Height:        General: Pt is alert, awake, not in acute distress Cardiovascular: RRR, S1/S2 +, no rubs, no gallops Respiratory: CTA bilaterally, no wheezing, no rhonchi Abdominal: Soft, NT, ND, bowel sounds + Extremities: no edema, no cyanosis    The results of significant diagnostics from this hospitalization (including imaging, microbiology, ancillary and laboratory) are listed below for reference.     Microbiology: Recent Results (from the past 240 hour(s))  SARS CORONAVIRUS 2 (TAT 6-24 HRS) Nasopharyngeal Nasopharyngeal Swab     Status: None   Collection Time: 04/26/19  2:53 PM   Specimen: Nasopharyngeal Swab  Result Value Ref Range Status   SARS Coronavirus 2 NEGATIVE NEGATIVE Final    Comment: (NOTE) SARS-CoV-2 target nucleic acids are NOT DETECTED. The SARS-CoV-2 RNA is generally detectable in upper and lower respiratory specimens during the acute phase of infection. Negative results do not preclude SARS-CoV-2 infection, do not rule out co-infections with other pathogens, and should not be used as the sole basis for treatment or other patient management decisions. Negative results must  be combined with clinical observations, patient history, and epidemiological information. The expected result is Negative. Fact Sheet for Patients: SugarRoll.be Fact Sheet for Healthcare Providers: https://www.woods-mathews.com/ This test is not yet approved or cleared by the [place] FDA and  has been authorized for detection  and/or diagnosis of SARS-CoV-2 by FDA under an Emergency Use Authorization (EUA). This EUA will remain  in effect (meaning this test can be used) for the duration of the COVID-19 declaration under Section 56 4(b)(1) of the Act, 21 U.S.C. section 360bbb-3(b)(1), unless the authorization is terminated or revoked sooner. Performed at [facility], [place] [address], [place], [place] [address]   MRSA PCR Screening     Status: None   Collection Time: 04/26/19  6:55 PM   Specimen: Nasopharyngeal  Result Value Ref Range Status   MRSA by PCR NEGATIVE NEGATIVE Final    Comment:        The GeneXpert MRSA Assay (FDA approved for NASAL specimens only), is one component of a comprehensive MRSA colonization surveillance program. It is not intended to diagnose MRSA infection nor to guide or monitor treatment for MRSA infections. Performed at [facility], [place] [address], [place], [place] [address]      Labs: BNP (last 3 results) Recent Labs    04/26/19 1414  BNP 998.3*   Basic Metabolic Panel: Recent Labs  Lab 04/26/19 1414 04/26/19 1620 04/27/19 0215 04/28/19 0426 04/29/19 0541 04/30/19 0431 05/01/19 0345  NA   < >  --  138 138 138 136 136  K   < >  --  4.0 4.2 4.0 4.1 5.1  CL   < >  --  105 105 101 99 102  CO2   < >  --  22 22 26 27 23   GLUCOSE   < >  --  202* 125* 114* 118* 162*  BUN   < >  --  38* 40* 41* 46* 47*  CREATININE   < >  --  2.97* 2.62* 2.51* 2.38* 2.30*  CALCIUM   < >  --  9.0 8.7* 8.7* 8.7* 8.7*  MG  --  1.8  --   --   --   --   --    < > = values in this interval not  displayed.   Liver Function Tests: No results for input(s): AST, ALT, ALKPHOS, BILITOT, PROT, ALBUMIN in the last 168 hours. No results for input(s): LIPASE, AMYLASE in the last 168 hours. No results for input(s): AMMONIA in the last 168 hours. CBC: Recent Labs  Lab 04/26/19 1414 04/26/19 1414 04/27/19 0215 04/28/19 0426 04/29/19 0541 04/30/19 0431 05/01/19 0345  WBC 5.8   < > 5.0 4.6 3.6* 3.6* 3.6*  NEUTROABS 4.1  --   --   --   --   --   --   HGB 13.6   < > 14.1 13.1 13.9 13.0 13.1  HCT 43.9   < > 44.4 41.2 42.6 39.8 41.2  MCV 90.9   < > 88.3 87.8 86.6 86.7 87.8  PLT 175   < > 171 157 164 154 149*   < > = values in this interval not displayed.   Cardiac Enzymes: No results for input(s): CKTOTAL, CKMB, CKMBINDEX, TROPONINI in the last 168 hours. BNP: Invalid input(s): POCBNP CBG: Recent Labs  Lab 04/30/19 0611 04/30/19 1057 04/30/19 1613 04/30/19 2058 05/01/19 0554  GLUCAP 112* 179* 153* 158* 156*   D-Dimer No results for input(s): DDIMER in the last 72 hours. Hgb A1c No results for input(s): HGBA1C in the last 72 hours. Lipid Profile No results for input(s): CHOL, HDL, LDLCALC, TRIG, CHOLHDL, LDLDIRECT in the last 72 hours. Thyroid function studies No results for input(s): TSH, T4TOTAL, T3FREE, THYROIDAB in the  last 72 hours.  Invalid input(s): FREET3 Anemia work up No results for input(s): VITAMINB12, FOLATE, FERRITIN, TIBC, IRON, RETICCTPCT in the last 72 hours. Urinalysis    Component Value Date/Time   COLORURINE YELLOW 04/27/2019 1742   APPEARANCEUR CLEAR 04/27/2019 1742   LABSPEC 1.009 04/27/2019 1742   PHURINE 5.0 04/27/2019 1742   GLUCOSEU NEGATIVE 04/27/2019 1742   HGBUR SMALL (A) 04/27/2019 1742   BILIRUBINUR NEGATIVE 04/27/2019 1742   KETONESUR NEGATIVE 04/27/2019 1742   PROTEINUR 100 (A) 04/27/2019 1742   NITRITE NEGATIVE 04/27/2019 1742   LEUKOCYTESUR NEGATIVE 04/27/2019 1742   Sepsis Labs Invalid input(s): PROCALCITONIN,  WBC,   LACTICIDVEN Microbiology Recent Results (from the past 240 hour(s))  SARS CORONAVIRUS 2 (TAT 6-24 HRS) Nasopharyngeal Nasopharyngeal Swab     Status: None   Collection Time: 04/26/19  2:53 PM   Specimen: Nasopharyngeal Swab  Result Value Ref Range Status   SARS Coronavirus 2 NEGATIVE NEGATIVE Final    Comment: (NOTE) SARS-CoV-2 target nucleic acids are NOT DETECTED. The SARS-CoV-2 RNA is generally detectable in upper and lower respiratory specimens during the acute phase of infection. Negative results do not preclude SARS-CoV-2 infection, do not rule out co-infections with other pathogens, and should not be used as the sole basis for treatment or other patient management decisions. Negative results must be combined with clinical observations, patient history, and epidemiological information. The expected result is Negative. Fact Sheet for Patients: SugarRoll.be Fact Sheet for Healthcare Providers: https://www.woods-mathews.com/ This test is not yet approved or cleared by the [place] FDA and  has been authorized for detection and/or diagnosis of SARS-CoV-2 by FDA under an Emergency Use Authorization (EUA). This EUA will remain  in effect (meaning this test can be used) for the duration of the COVID-19 declaration under Section 56 4(b)(1) of the Act, 21 U.S.C. section 360bbb-3(b)(1), unless the authorization is terminated or revoked sooner. Performed at [facility], [place] [address], [place], [place] [address]   MRSA PCR Screening     Status: None   Collection Time: 04/26/19  6:55 PM   Specimen: Nasopharyngeal  Result Value Ref Range Status   MRSA by PCR NEGATIVE NEGATIVE Final    Comment:        The GeneXpert MRSA Assay (FDA approved for NASAL specimens only), is one component of a comprehensive MRSA colonization surveillance program. It is not intended to diagnose MRSA infection nor to guide or monitor treatment for MRSA  infections. Performed at [facility], [place] [address] [place], [place] [address]      Time coordinating discharge: Over 30 minutes  SIGNED:   Tregan Read J [place] DO  [facility] 05/01/2019, 11:29 AM

## 2019-05-02 NOTE — Telephone Encounter (Signed)
**Note De-Identified Kyle Newton Obfuscation** 1st TCM Call attempt. The pts home phone rings 1 time and then gives a busy signal (I have called multiple times).  His mobile phone voice mail box is full.  Unable to reach the pt at phone numbers provided. We will try again tomorrow.

## 2019-05-03 NOTE — Telephone Encounter (Signed)
I placed two calls to mobile number listed for patient but there was no answer and voicemail is full. I placed call to home number but received message call could not be completed.  I tried twice and received same message both times.

## 2019-05-03 NOTE — Telephone Encounter (Signed)
VM is full-sent SMS text message w/our phone number.

## 2019-05-06 NOTE — Telephone Encounter (Signed)
lpmtcb 4/12

## 2019-05-06 NOTE — Telephone Encounter (Signed)
TOC call attempt - LMTCB  Patient has OV on 4/14 with Oda Kilts PA - EP Patient has OV on 4/14 with Almyra Deforest PA

## 2019-05-07 ENCOUNTER — Ambulatory Visit: Payer: Medicare Other | Admitting: Physician Assistant

## 2019-05-08 ENCOUNTER — Encounter: Payer: Medicare Other | Admitting: Student

## 2019-05-08 NOTE — Progress Notes (Deleted)
Electrophysiology Office Note Date: 05/08/2019  ID:  Kyle Newton, DOB [birth year], MRN [id number]  PCP: Patient, No Pcp Per Primary Cardiologist: No primary care provider on file. Electrophysiologist: None ***  CC: Routine ICD follow-up  Kyle Newton is a 64 y.o. male seen today for {Blank single:19197::"Dr. Allred","Dr. Arlan Organ. Klein","Dr. Camnitz"}.  They present today for routine electrophysiology followup.  Since last being seen in our clinic, the patient reports doing very well. They deny chest pain, palpitations, dyspnea, PND, orthopnea, nausea, vomiting, dizziness, syncope, edema, weight gain, or early satiety.  {He/she (caps):30048} has not had ICD shocks.   Device History: {Blank single:19197::"Medtronic","St. Jude","[employer] Scientific","Biotronik"} {Blank single:19197::"Dual Chamber","Single Chamber","BiV","S-ICD"} ICD implanted *** for *** History of appropriate therapy: {yes/no:20286} History of AAD therapy: {yes/no:20286}   Past Medical History:  Diagnosis Date  . CHF (congestive heart failure) ([place])   . Diabetes mellitus without complication ([place])   . Hypertension    Past Surgical History:  Procedure Laterality Date  . CARDIAC VALVE REPLACEMENT      Current Outpatient Medications  Medication Sig Dispense Refill  . apixaban (ELIQUIS) 5 MG TABS tablet Take 1 tablet (5 mg total) by mouth 2 (two) times daily. 180 tablet 0  . atorvastatin (LIPITOR) 10 MG tablet Take 1 tablet (10 mg total) by mouth daily. 30 tablet 11  . carvedilol (COREG) 3.125 MG tablet Take 1 tablet (3.125 mg total) by mouth 2 (two) times daily with a meal. 180 tablet 0  . furosemide (LASIX) 40 MG tablet Take 1 tablet (40 mg total) by mouth 2 (two) times daily. 180 tablet 0  . glipiZIDE (GLUCOTROL) 5 MG tablet Take 1 tablet (5 mg total) by mouth daily before breakfast. 90 tablet 0  . hydrALAZINE (APRESOLINE) 25 MG tablet Take 1 tablet (25 mg total) by mouth every 8 (eight) hours. 270 tablet 0   . isosorbide mononitrate (IMDUR) 30 MG 24 hr tablet Take 1 tablet (30 mg total) by mouth daily. 90 tablet 0  . Semaglutide,0.25 or 0.5MG /DOS, 2 MG/1.5ML SOPN Inject 0.5 mg into the skin once a week.     No current facility-administered medications for this visit.    Allergies:   Patient has no known allergies.   Social History: Social History   Socioeconomic History  . Marital status: Widowed    Spouse name: Not on file  . Number of children: Not on file  . Years of education: Not on file  . Highest education level: Not on file  Occupational History  . Not on file  Tobacco Use  . Smoking status: Current Every Day Smoker    Packs/day: 1.00    Years: 44.00    Pack years: 44.00    Types: Cigarettes  . Smokeless tobacco: Never Used  Substance and Sexual Activity  . Alcohol use: Not Currently  . Drug use: Not Currently    Types: Marijuana, Cocaine  . Sexual activity: Not on file  Other Topics Concern  . Not on file  Social History Narrative  . Not on file   Social Determinants of Health   Financial Resource Strain:   . Difficulty of Paying Living Expenses:   Food Insecurity:   . Worried About Charity fundraiser in the Last Year:   . Arboriculturist in the Last Year:   Transportation Needs:   . Film/video editor (Medical):   Marland Kitchen Lack of Transportation (Non-Medical):   Physical Activity:   . Days of Exercise per Week:   .  Minutes of Exercise per Session:   Stress:   . Feeling of Stress :   Social Connections:   . Frequency of Communication with Friends and Family:   . Frequency of Social Gatherings with Friends and Family:   . Attends Religious Services:   . Active Member of Clubs or Organizations:   . Attends Archivist Meetings:   Marland Kitchen Marital Status:   Intimate Partner Violence:   . Fear of Current or Ex-Partner:   . Emotionally Abused:   Marland Kitchen Physically Abused:   . Sexually Abused:     Family History: Family History  Problem Relation Age of  Onset  . Heart failure Mother   . Diabetes Mother   . Diabetes Father     Review of Systems: All other systems reviewed and are otherwise negative except as noted above.   Physical Exam: There were no vitals filed for this visit.   GEN- The patient is well appearing, alert and oriented x 3 today.   HEENT: normocephalic, atraumatic; sclera clear, conjunctiva pink; hearing intact; oropharynx clear; neck supple, no JVP Lymph- no cervical lymphadenopathy Lungs- Clear to ausculation bilaterally, normal work of breathing.  No wheezes, rales, rhonchi Heart- Regular rate and rhythm, no murmurs, rubs or gallops, PMI not laterally displaced GI- soft, non-tender, non-distended, bowel sounds present, no hepatosplenomegaly Extremities- no clubbing, cyanosis, or edema; DP/PT/radial pulses 2+ bilaterally MS- no significant deformity or atrophy Skin- warm and dry, no rash or lesion; ICD pocket well healed Psych- euthymic mood, full affect Neuro- strength and sensation are intact  ICD interrogation- reviewed in detail today,  See PACEART report  EKG:  EKG is not ordered today. The ekg ordered 04/26/2019 showed atrial flutter with V pacing at 60. Personally reviewed  Recent Labs: 04/26/2019: B Natriuretic Peptide 780.7; Magnesium 1.8; TSH 2.723 05/01/2019: BUN 47; Creatinine, Ser 2.30; Hemoglobin 13.1; Platelets 149; Potassium 5.1; Sodium 136   Wt Readings from Last 3 Encounters:  05/01/19 177 lb 8 oz (80.5 kg)  06/25/17 164 lb 11.2 oz (74.7 kg)  06/20/17 163 lb 2.3 oz (74 kg)     Other studies Reviewed: Additional studies/ records that were reviewed today include: Recent admission notes. CareEverywhere request "in progress" for "[facility] [facility]"  Assessment and Plan:  1.  Chronic systolic dysfunction s/p Medtronic {Blank single:19197::"single chamber ICD","dual chamber ICD","CRT-D","S-ICD"}   Echo 04/27/2019 LVEF 25-30% euvolemic today Stable on an appropriate medical regimen Normal ICD  function See Pace Art report No changes today   Current medicines are reviewed at length with the patient today.   The patient {ACTIONS; HAS/DOES NOT HAVE:19233} concerns regarding his medicines.  The following changes were made today:  {NONE DEFAULTED:18576::"none"}  Labs/ tests ordered today include: *** No orders of the defined types were placed in this encounter.   Disposition:   Follow up with Dr. Caryl Comes to establish in next several months.  Jacalyn Lefevre, PA-C  05/08/2019 8:19 AM  [facility] [address] [place] [address] [phone] (office) [phone] (fax)

## 2019-05-09 ENCOUNTER — Telehealth: Payer: Self-pay

## 2019-05-09 ENCOUNTER — Encounter: Payer: Medicare Other | Admitting: Physician Assistant

## 2019-05-09 NOTE — Telephone Encounter (Signed)
Tried calling patient on the numbers listed for him to see if he needed to be rescheduled to a different day since he did not come to today's appointment. The home number is no longer in service and the voicemail box is full so I could not leave a message.

## 2019-05-11 NOTE — Progress Notes (Signed)
This encounter was created in error - please disregard.

## 2019-05-14 NOTE — Progress Notes (Deleted)
Patient ID: Kyle Newton, male   DOB: [birth year], 64 y.o.   MRN: [id number]   After hospitalization 4/2-05/01/2019.  From discharge summary: History of present illness:  Kyle Newton is a 64 year old noncompliant male with history of chronic systolic CHF, type 2 diabetes mellitus, history of aortic valve replacement, ICD, remote history of seizure disorder, has not seen a doctor or taken any of his cardiac medications for well over a year presented to the ED with progressive dyspnea on exertion, orthopnea and lower extremity edema for 1 week.  Work-up in the emergency room noted elevated blood pressure, creatinine of 3.0, BNP of 780, chest x-ray with cardiomegaly, pleural effusion, questionable right basilar infiltrate.   Hospital course:  Acute on chronic systolic congestive heart failure s/p ICD Presenting with progressive dyspnea, lower extremity edema for 1 week.  He has been noncompliant with all of his cardiac meds for roughly 1 year.  Cardiology was consulted and followed during hospital course.  Started on diuresis with IV furosemide with good output, net -10 L during hospitalization.  TTE notable for EF 25-30% with global hypokinesis, which is a decrease in EF from his previous echo dated 2019.  TSH within normal limits.  Patient will discharge on carvedilol to 5 mg p.o. twice daily, Imdur 30 mg p.o. daily, furosemide 40 mg p.o. twice daily.  Holding off initiating ACE inhibitor until renal function improves.  Has follow-up scheduled with cardiology following discharge.  Questionable right lower lobe infiltrate: Pneumonia ruled out Initially started on empiric antibiotics.  Clinically I think this is all CHF related, he is afebrile white count is normal and procalcitonin is less than 0.1. Antibiotics discontinued.  And afebrile without leukocytosis.  Acute renal failure superimposed on chronic kidney disease stage III: Patient presents with creatinine elevated up to 3.04 with  BUN 32. Urinalysis with moderate proteinuria.  Creatinine previously noted to be around 1.57 back in 2019, recent baseline unknown.  Creatinine improved with diuresis, was 2.30 at time of discharge. Suspect cardiorenal component given improvement with diuresis.  Discharging on furosemide 40 mg p.o. twice daily as above.  Recommend repeat renal function in 1-2 weeks.  Elevated troponin:  High-sensitivity troponins mildly elevated 73->71, suspect this is secondary to demand in the setting of CHF and CKD, No symptoms of ACS.  History of Aortic Valve Replacement TTE this admission showed moderate AS. Mean gradient of 14.0 mmHg. Peak gradient of 29.9 mmHg. Aortic valve area by VTI measures 0.55 cm^2.  Continue outpatient follow-up with cardiology.  Paroxysmal atrial fibrillation/flutter: Patient's ICD was interrogated and noted chronic atrial fibrillation of the past 400 days, has moderate biatrial enlargement on echocardiogram, not a good candidate for DCCV per cardiology.  We will continue rate control with carvedilol 3.125 mg p.o. twice daily and anticoagulation with Eliquis.  Outpatient follow-up with cardiology.  Diabetes mellitus type 2: Hemoglobin A1c 8.8, improved from 16.3 in May 2019.  Will initiate glipizide 5 mg p.o. daily on discharge.  Patient instructed on consistent carbohydrate diet.  Avoiding Metformin with renal insufficiency.  Essential hypertension: Continue carvedilol 3.125 mg p.o. twice daily, hydralazine 25 mg p.o. every 8 hours, Imdur 30 mg p.o. daily, furosemide 40 mg p.o. twice daily.  History of seizures: Patient reports that he has been off of [place] since 2019. Previously taking Keppra 500 mg daily. No history of seizures recently, has not taken Keppra in 1 to 2 years.  Will defer further management per PCP versus neurology.  Tobacco abuse:  Patient admits to  smoking a pack cigarettes per day on average. Counseled the patient on the need cessation of tobacco  use.  Discharge Diagnoses:  Principal Problem:   Acute exacerbation of CHF (congestive heart failure) (HCC) Active Problems:   Scabies   ICD (implantable cardioverter-defibrillator) in place   History of seizures   Diabetes mellitus type 2 with complications ([place])   Community acquired pneumonia   Acute renal failure superimposed on stage 3a chronic kidney disease (HCC)   Atrial fibrillation (HCC)   Elevated troponin

## 2019-05-15 ENCOUNTER — Ambulatory Visit: Payer: Medicare Other | Attending: Family Medicine

## 2019-05-15 ENCOUNTER — Other Ambulatory Visit: Payer: Self-pay

## 2019-05-17 ENCOUNTER — Encounter: Payer: Self-pay | Admitting: Internal Medicine

## 2019-05-17 MED ORDER — GENERIC EXTERNAL MEDICATION
Status: DC
Start: ? — End: 2019-05-17

## 2019-05-17 MED ORDER — SODIUM CHLORIDE 0.9 % IV SOLN
10.00 | INTRAVENOUS | Status: DC
Start: ? — End: 2019-05-17

## 2019-05-17 MED ORDER — INSULIN LISPRO 100 UNIT/ML ~~LOC~~ SOLN
1.00 | SUBCUTANEOUS | Status: DC
Start: 2019-05-17 — End: 2019-05-17

## 2019-05-17 MED ORDER — GUAIFENESIN 100 MG/5ML PO SYRP
200.00 | ORAL_SOLUTION | ORAL | Status: DC
Start: ? — End: 2019-05-17

## 2019-05-17 MED ORDER — LEVETIRACETAM 500 MG PO TABS
500.00 | ORAL_TABLET | ORAL | Status: DC
Start: 2019-05-18 — End: 2019-05-17

## 2019-05-17 MED ORDER — NITROGLYCERIN 2 % TD OINT
1.00 | TOPICAL_OINTMENT | TRANSDERMAL | Status: DC
Start: 2019-05-17 — End: 2019-05-17

## 2019-05-17 MED ORDER — HYDRALAZINE HCL 25 MG PO TABS
37.50 | ORAL_TABLET | ORAL | Status: DC
Start: 2019-05-17 — End: 2019-05-17

## 2019-05-17 MED ORDER — FUROSEMIDE 10 MG/ML IJ SOLN
80.00 | INTRAMUSCULAR | Status: DC
Start: 2019-05-17 — End: 2019-05-17

## 2019-05-17 MED ORDER — SENNOSIDES-DOCUSATE SODIUM 8.6-50 MG PO TABS
1.00 | ORAL_TABLET | ORAL | Status: DC
Start: ? — End: 2019-05-17

## 2019-05-17 MED ORDER — ISOSORBIDE DINITRATE 10 MG PO TABS
20.00 | ORAL_TABLET | ORAL | Status: DC
Start: 2019-05-17 — End: 2019-05-17

## 2019-05-17 MED ORDER — HYDROCODONE-ACETAMINOPHEN 5-325 MG PO TABS
1.00 | ORAL_TABLET | ORAL | Status: DC
Start: ? — End: 2019-05-17

## 2019-05-17 MED ORDER — RIVAROXABAN 15 MG PO TABS
15.00 | ORAL_TABLET | ORAL | Status: DC
Start: 2019-05-17 — End: 2019-05-17

## 2019-05-17 MED ORDER — ALBUTEROL SULFATE (2.5 MG/3ML) 0.083% IN NEBU
2.50 | INHALATION_SOLUTION | RESPIRATORY_TRACT | Status: DC
Start: ? — End: 2019-05-17

## 2019-05-17 MED ORDER — INSULIN GLARGINE 100 UNIT/ML ~~LOC~~ SOLN
1.00 | SUBCUTANEOUS | Status: DC
Start: 2019-05-17 — End: 2019-05-17

## 2019-05-17 MED ORDER — CARVEDILOL 6.25 MG PO TABS
6.25 | ORAL_TABLET | ORAL | Status: DC
Start: 2019-05-17 — End: 2019-05-17

## 2019-05-17 MED ORDER — PRAVASTATIN SODIUM 40 MG PO TABS
40.00 | ORAL_TABLET | ORAL | Status: DC
Start: 2019-05-17 — End: 2019-05-17

## 2019-05-17 MED ORDER — NICOTINE 21 MG/24HR TD PT24
1.00 | MEDICATED_PATCH | TRANSDERMAL | Status: DC
Start: 2019-05-18 — End: 2019-05-17

## 2019-05-17 MED ORDER — INSULIN LISPRO 100 UNIT/ML ~~LOC~~ SOLN
1.00 | SUBCUTANEOUS | Status: DC
Start: ? — End: 2019-05-17

## 2019-11-14 ENCOUNTER — Inpatient Hospital Stay (HOSPITAL_COMMUNITY): Payer: Medicare Other

## 2019-11-14 ENCOUNTER — Other Ambulatory Visit: Payer: Self-pay

## 2019-11-14 ENCOUNTER — Inpatient Hospital Stay (HOSPITAL_COMMUNITY)
Admission: EM | Admit: 2019-11-14 | Discharge: 2019-11-25 | DRG: 291 | Disposition: E | Payer: Medicare Other | Attending: Internal Medicine | Admitting: Internal Medicine

## 2019-11-14 ENCOUNTER — Encounter (HOSPITAL_COMMUNITY): Payer: Self-pay | Admitting: Internal Medicine

## 2019-11-14 ENCOUNTER — Emergency Department (HOSPITAL_COMMUNITY): Payer: Medicare Other

## 2019-11-14 DIAGNOSIS — Z9114 Patient's other noncompliance with medication regimen: Secondary | ICD-10-CM | POA: Diagnosis not present

## 2019-11-14 DIAGNOSIS — Z7901 Long term (current) use of anticoagulants: Secondary | ICD-10-CM | POA: Diagnosis not present

## 2019-11-14 DIAGNOSIS — I509 Heart failure, unspecified: Secondary | ICD-10-CM

## 2019-11-14 DIAGNOSIS — R601 Generalized edema: Secondary | ICD-10-CM | POA: Diagnosis present

## 2019-11-14 DIAGNOSIS — Z79899 Other long term (current) drug therapy: Secondary | ICD-10-CM

## 2019-11-14 DIAGNOSIS — E785 Hyperlipidemia, unspecified: Secondary | ICD-10-CM | POA: Diagnosis present

## 2019-11-14 DIAGNOSIS — Z95810 Presence of automatic (implantable) cardiac defibrillator: Secondary | ICD-10-CM

## 2019-11-14 DIAGNOSIS — N4889 Other specified disorders of penis: Secondary | ICD-10-CM | POA: Diagnosis present

## 2019-11-14 DIAGNOSIS — I5023 Acute on chronic systolic (congestive) heart failure: Secondary | ICD-10-CM

## 2019-11-14 DIAGNOSIS — N184 Chronic kidney disease, stage 4 (severe): Secondary | ICD-10-CM | POA: Diagnosis not present

## 2019-11-14 DIAGNOSIS — R4189 Other symptoms and signs involving cognitive functions and awareness: Secondary | ICD-10-CM | POA: Diagnosis present

## 2019-11-14 DIAGNOSIS — Z66 Do not resuscitate: Secondary | ICD-10-CM | POA: Diagnosis not present

## 2019-11-14 DIAGNOSIS — I429 Cardiomyopathy, unspecified: Secondary | ICD-10-CM | POA: Diagnosis present

## 2019-11-14 DIAGNOSIS — I132 Hypertensive heart and chronic kidney disease with heart failure and with stage 5 chronic kidney disease, or end stage renal disease: Principal | ICD-10-CM | POA: Diagnosis present

## 2019-11-14 DIAGNOSIS — I484 Atypical atrial flutter: Secondary | ICD-10-CM | POA: Diagnosis present

## 2019-11-14 DIAGNOSIS — E1122 Type 2 diabetes mellitus with diabetic chronic kidney disease: Secondary | ICD-10-CM | POA: Diagnosis present

## 2019-11-14 DIAGNOSIS — D631 Anemia in chronic kidney disease: Secondary | ICD-10-CM | POA: Diagnosis present

## 2019-11-14 DIAGNOSIS — Z419 Encounter for procedure for purposes other than remedying health state, unspecified: Secondary | ICD-10-CM

## 2019-11-14 DIAGNOSIS — F17210 Nicotine dependence, cigarettes, uncomplicated: Secondary | ICD-10-CM | POA: Diagnosis present

## 2019-11-14 DIAGNOSIS — F1210 Cannabis abuse, uncomplicated: Secondary | ICD-10-CM | POA: Diagnosis present

## 2019-11-14 DIAGNOSIS — Z7984 Long term (current) use of oral hypoglycemic drugs: Secondary | ICD-10-CM

## 2019-11-14 DIAGNOSIS — K30 Functional dyspepsia: Secondary | ICD-10-CM | POA: Diagnosis present

## 2019-11-14 DIAGNOSIS — I493 Ventricular premature depolarization: Secondary | ICD-10-CM | POA: Diagnosis present

## 2019-11-14 DIAGNOSIS — R188 Other ascites: Secondary | ICD-10-CM | POA: Diagnosis present

## 2019-11-14 DIAGNOSIS — R0683 Snoring: Secondary | ICD-10-CM | POA: Diagnosis present

## 2019-11-14 DIAGNOSIS — G9341 Metabolic encephalopathy: Secondary | ICD-10-CM | POA: Diagnosis present

## 2019-11-14 DIAGNOSIS — I5043 Acute on chronic combined systolic (congestive) and diastolic (congestive) heart failure: Secondary | ICD-10-CM | POA: Diagnosis present

## 2019-11-14 DIAGNOSIS — I10 Essential (primary) hypertension: Secondary | ICD-10-CM | POA: Diagnosis not present

## 2019-11-14 DIAGNOSIS — N2581 Secondary hyperparathyroidism of renal origin: Secondary | ICD-10-CM | POA: Diagnosis present

## 2019-11-14 DIAGNOSIS — E440 Moderate protein-calorie malnutrition: Secondary | ICD-10-CM | POA: Diagnosis present

## 2019-11-14 DIAGNOSIS — F1910 Other psychoactive substance abuse, uncomplicated: Secondary | ICD-10-CM | POA: Diagnosis not present

## 2019-11-14 DIAGNOSIS — I5021 Acute systolic (congestive) heart failure: Secondary | ICD-10-CM | POA: Diagnosis not present

## 2019-11-14 DIAGNOSIS — N186 End stage renal disease: Secondary | ICD-10-CM | POA: Diagnosis present

## 2019-11-14 DIAGNOSIS — K5900 Constipation, unspecified: Secondary | ICD-10-CM | POA: Diagnosis not present

## 2019-11-14 DIAGNOSIS — F1410 Cocaine abuse, uncomplicated: Secondary | ICD-10-CM | POA: Diagnosis present

## 2019-11-14 DIAGNOSIS — I4821 Permanent atrial fibrillation: Secondary | ICD-10-CM | POA: Diagnosis present

## 2019-11-14 DIAGNOSIS — Z8249 Family history of ischemic heart disease and other diseases of the circulatory system: Secondary | ICD-10-CM

## 2019-11-14 DIAGNOSIS — L8990 Pressure ulcer of unspecified site, unspecified stage: Secondary | ICD-10-CM | POA: Insufficient documentation

## 2019-11-14 DIAGNOSIS — Z515 Encounter for palliative care: Secondary | ICD-10-CM | POA: Diagnosis not present

## 2019-11-14 DIAGNOSIS — N5089 Other specified disorders of the male genital organs: Secondary | ICD-10-CM | POA: Diagnosis not present

## 2019-11-14 DIAGNOSIS — Z20822 Contact with and (suspected) exposure to covid-19: Secondary | ICD-10-CM | POA: Diagnosis present

## 2019-11-14 DIAGNOSIS — Z833 Family history of diabetes mellitus: Secondary | ICD-10-CM | POA: Diagnosis not present

## 2019-11-14 DIAGNOSIS — Z952 Presence of prosthetic heart valve: Secondary | ICD-10-CM

## 2019-11-14 DIAGNOSIS — I959 Hypotension, unspecified: Secondary | ICD-10-CM | POA: Diagnosis not present

## 2019-11-14 DIAGNOSIS — Z992 Dependence on renal dialysis: Secondary | ICD-10-CM

## 2019-11-14 DIAGNOSIS — Z452 Encounter for adjustment and management of vascular access device: Secondary | ICD-10-CM

## 2019-11-14 DIAGNOSIS — N179 Acute kidney failure, unspecified: Secondary | ICD-10-CM | POA: Diagnosis present

## 2019-11-14 DIAGNOSIS — N170 Acute kidney failure with tubular necrosis: Secondary | ICD-10-CM | POA: Diagnosis not present

## 2019-11-14 DIAGNOSIS — Z7189 Other specified counseling: Secondary | ICD-10-CM | POA: Diagnosis not present

## 2019-11-14 DIAGNOSIS — R0602 Shortness of breath: Secondary | ICD-10-CM

## 2019-11-14 DIAGNOSIS — N185 Chronic kidney disease, stage 5: Secondary | ICD-10-CM | POA: Diagnosis not present

## 2019-11-14 DIAGNOSIS — Z95828 Presence of other vascular implants and grafts: Secondary | ICD-10-CM

## 2019-11-14 DIAGNOSIS — E875 Hyperkalemia: Secondary | ICD-10-CM | POA: Diagnosis present

## 2019-11-14 DIAGNOSIS — Z6829 Body mass index (BMI) 29.0-29.9, adult: Secondary | ICD-10-CM

## 2019-11-14 LAB — CBC WITH DIFFERENTIAL/PLATELET
Abs Immature Granulocytes: 0.01 10*3/uL (ref 0.00–0.07)
Basophils Absolute: 0.1 10*3/uL (ref 0.0–0.1)
Basophils Relative: 1 %
Eosinophils Absolute: 0.2 10*3/uL (ref 0.0–0.5)
Eosinophils Relative: 4 %
HCT: 43.8 % (ref 39.0–52.0)
Hemoglobin: 13.7 g/dL (ref 13.0–17.0)
Immature Granulocytes: 0 %
Lymphocytes Relative: 15 %
Lymphs Abs: 0.7 10*3/uL (ref 0.7–4.0)
MCH: 28.4 pg (ref 26.0–34.0)
MCHC: 31.3 g/dL (ref 30.0–36.0)
MCV: 90.9 fL (ref 80.0–100.0)
Monocytes Absolute: 0.9 10*3/uL (ref 0.1–1.0)
Monocytes Relative: 19 %
Neutro Abs: 2.7 10*3/uL (ref 1.7–7.7)
Neutrophils Relative %: 61 %
Platelets: 166 10*3/uL (ref 150–400)
RBC: 4.82 MIL/uL (ref 4.22–5.81)
RDW: 18.2 % — ABNORMAL HIGH (ref 11.5–15.5)
WBC: 4.4 10*3/uL (ref 4.0–10.5)
nRBC: 0 % (ref 0.0–0.2)

## 2019-11-14 LAB — COMPREHENSIVE METABOLIC PANEL
ALT: 23 U/L (ref 0–44)
AST: 35 U/L (ref 15–41)
Albumin: 3.3 g/dL — ABNORMAL LOW (ref 3.5–5.0)
Alkaline Phosphatase: 164 U/L — ABNORMAL HIGH (ref 38–126)
Anion gap: 8 (ref 5–15)
BUN: 70 mg/dL — ABNORMAL HIGH (ref 8–23)
CO2: 17 mmol/L — ABNORMAL LOW (ref 22–32)
Calcium: 9.1 mg/dL (ref 8.9–10.3)
Chloride: 110 mmol/L (ref 98–111)
Creatinine, Ser: 3.92 mg/dL — ABNORMAL HIGH (ref 0.61–1.24)
GFR, Estimated: 16 mL/min — ABNORMAL LOW (ref >60)
Glucose, Bld: 112 mg/dL — ABNORMAL HIGH (ref 70–99)
Potassium: 6.1 mmol/L — ABNORMAL HIGH (ref 3.5–5.1)
Sodium: 135 mmol/L (ref 135–145)
Total Bilirubin: 1.2 mg/dL (ref 0.3–1.2)
Total Protein: 9 g/dL — ABNORMAL HIGH (ref 6.5–8.1)

## 2019-11-14 LAB — URINALYSIS, ROUTINE W REFLEX MICROSCOPIC
Bilirubin Urine: NEGATIVE
Glucose, UA: NEGATIVE mg/dL
Hgb urine dipstick: NEGATIVE
Ketones, ur: NEGATIVE mg/dL
Leukocytes,Ua: NEGATIVE
Nitrite: NEGATIVE
Protein, ur: 100 mg/dL — AB
Specific Gravity, Urine: 1.017 (ref 1.005–1.030)
pH: 5 (ref 5.0–8.0)

## 2019-11-14 LAB — RESPIRATORY PANEL BY RT PCR (FLU A&B, COVID)
Influenza A by PCR: NEGATIVE
Influenza B by PCR: NEGATIVE
SARS Coronavirus 2 by RT PCR: NEGATIVE

## 2019-11-14 LAB — TROPONIN I (HIGH SENSITIVITY): Troponin I (High Sensitivity): 43 ng/L — ABNORMAL HIGH (ref <18)

## 2019-11-14 LAB — BRAIN NATRIURETIC PEPTIDE: B Natriuretic Peptide: 489.6 pg/mL — ABNORMAL HIGH (ref 0.0–100.0)

## 2019-11-14 LAB — GLUCOSE, CAPILLARY: Glucose-Capillary: 129 mg/dL — ABNORMAL HIGH (ref 70–99)

## 2019-11-14 MED ORDER — INSULIN ASPART 100 UNIT/ML ~~LOC~~ SOLN
0.0000 [IU] | Freq: Three times a day (TID) | SUBCUTANEOUS | Status: DC
  Administered 2019-11-15 – 2019-11-16 (×2): 2 [IU] via SUBCUTANEOUS
  Administered 2019-11-17 – 2019-11-18 (×2): 3 [IU] via SUBCUTANEOUS
  Administered 2019-11-19 (×2): 2 [IU] via SUBCUTANEOUS
  Administered 2019-11-20: 3 [IU] via SUBCUTANEOUS
  Administered 2019-11-21: 2 [IU] via SUBCUTANEOUS
  Administered 2019-11-21: 3 [IU] via SUBCUTANEOUS

## 2019-11-14 MED ORDER — FUROSEMIDE 10 MG/ML IJ SOLN: 40.0000 mg | Freq: Two times a day (BID) | INTRAMUSCULAR | Status: DC

## 2019-11-14 MED ORDER — HEPARIN (PORCINE) 25000 UT/250ML-% IV SOLN
1700.0000 [IU]/h | INTRAVENOUS | Status: DC
  Administered 2019-11-14: 1200 [IU]/h via INTRAVENOUS
  Administered 2019-11-15 – 2019-11-16 (×3): 1500 [IU]/h via INTRAVENOUS
  Administered 2019-11-17 – 2019-11-18 (×2): 1700 [IU]/h via INTRAVENOUS
  Filled 2019-11-14 (×6): qty 250

## 2019-11-14 MED ORDER — ACETAMINOPHEN 650 MG RE SUPP: 650.0000 mg | Freq: Four times a day (QID) | RECTAL | Status: DC | PRN

## 2019-11-14 MED ORDER — HYDRALAZINE HCL 25 MG PO TABS
25.0000 mg | ORAL_TABLET | Freq: Three times a day (TID) | ORAL | Status: DC
  Administered 2019-11-14 – 2019-11-15 (×2): 25 mg via ORAL
  Filled 2019-11-14 (×2): qty 1

## 2019-11-14 MED ORDER — ONDANSETRON HCL 4 MG/2ML IJ SOLN
4.0000 mg | Freq: Four times a day (QID) | INTRAMUSCULAR | Status: DC | PRN
  Administered 2019-11-20: 4 mg via INTRAVENOUS
  Filled 2019-11-14: qty 2

## 2019-11-14 MED ORDER — ACETAMINOPHEN 325 MG PO TABS
650.0000 mg | ORAL_TABLET | Freq: Four times a day (QID) | ORAL | Status: DC | PRN
  Administered 2019-11-15 – 2019-11-21 (×4): 650 mg via ORAL
  Filled 2019-11-14 (×4): qty 2

## 2019-11-14 MED ORDER — ONDANSETRON HCL 4 MG PO TABS
4.0000 mg | ORAL_TABLET | Freq: Four times a day (QID) | ORAL | Status: DC | PRN
  Administered 2019-11-21: 4 mg via ORAL
  Filled 2019-11-14: qty 1

## 2019-11-14 MED ORDER — ISOSORBIDE MONONITRATE ER 30 MG PO TB24
30.0000 mg | ORAL_TABLET | Freq: Every day | ORAL | Status: DC
  Administered 2019-11-14 – 2019-11-19 (×6): 30 mg via ORAL
  Filled 2019-11-14 (×7): qty 1

## 2019-11-14 MED ORDER — CARVEDILOL 3.125 MG PO TABS
3.1250 mg | ORAL_TABLET | Freq: Two times a day (BID) | ORAL | Status: DC
  Administered 2019-11-14 – 2019-11-21 (×11): 3.125 mg via ORAL
  Filled 2019-11-14 (×12): qty 1

## 2019-11-14 MED ORDER — ISOSORBIDE MONONITRATE ER 30 MG PO TB24: 30.0000 mg | ORAL_TABLET | Freq: Every day | ORAL | Status: DC

## 2019-11-14 MED ORDER — FUROSEMIDE 10 MG/ML IJ SOLN
40.0000 mg | Freq: Once | INTRAMUSCULAR | Status: AC
  Administered 2019-11-14: 40 mg via INTRAVENOUS
  Filled 2019-11-14: qty 4

## 2019-11-14 MED ORDER — ATORVASTATIN CALCIUM 10 MG PO TABS: 10.0000 mg | ORAL_TABLET | Freq: Every day | ORAL | Status: DC

## 2019-11-14 MED ORDER — SODIUM ZIRCONIUM CYCLOSILICATE 10 G PO PACK
10.0000 g | PACK | Freq: Two times a day (BID) | ORAL | Status: DC
  Administered 2019-11-14 – 2019-11-19 (×9): 10 g via ORAL
  Filled 2019-11-14 (×9): qty 1

## 2019-11-14 MED ORDER — ATORVASTATIN CALCIUM 10 MG PO TABS
10.0000 mg | ORAL_TABLET | Freq: Every day | ORAL | Status: DC
  Administered 2019-11-14 – 2019-11-21 (×8): 10 mg via ORAL
  Filled 2019-11-14 (×8): qty 1

## 2019-11-14 MED ORDER — HEPARIN BOLUS VIA INFUSION
4000.0000 [IU] | Freq: Once | INTRAVENOUS | Status: AC
  Administered 2019-11-14: 4000 [IU] via INTRAVENOUS
  Filled 2019-11-14: qty 4000

## 2019-11-14 MED ORDER — HEPARIN SODIUM (PORCINE) 5000 UNIT/ML IJ SOLN: 5000.0000 [IU] | Freq: Three times a day (TID) | INTRAMUSCULAR | Status: DC

## 2019-11-14 MED ORDER — FUROSEMIDE 10 MG/ML IJ SOLN
80.0000 mg | Freq: Two times a day (BID) | INTRAMUSCULAR | Status: DC
  Administered 2019-11-14: 80 mg via INTRAVENOUS
  Filled 2019-11-14: qty 8

## 2019-11-14 NOTE — Progress Notes (Signed)
[place] for IV heparin Indication: atrial fibrillation  No Known Allergies  Patient Measurements: Height: 5\' 9"  (175.3 cm) Weight: 95.7 kg (211 lb) IBW/kg (Calculated) : 70.7 Heparin Dosing Weight: 91 kg  Vital Signs: Temp: 97.5 F (36.4 C) (10/21 1000) BP: 148/103 (10/21 1449) Pulse Rate: 60 (10/21 1449)  Labs: Recent Labs    11/19/2019 1031  HGB 13.7  HCT 43.8  PLT 166  CREATININE 3.92*  TROPONINIHS 43*    Estimated Creatinine Clearance: 21.7 mL/min (A) (by C-G formula based on SCr of 3.92 mg/dL (H)).   Medical History: Past Medical History:  Diagnosis Date  . CHF (congestive heart failure) ([place])   . Diabetes mellitus without complication ([place])   . Hypertension     Medications:  (Not in a hospital admission)  Scheduled:  . atorvastatin  10 mg Oral Daily  . carvedilol  3.125 mg Oral BID WC  . furosemide  80 mg Intravenous BID  . heparin  4,000 Units Intravenous Once  . hydrALAZINE  25 mg Oral Q8H  . isosorbide mononitrate  30 mg Oral Daily   Infusions:  . heparin     PRN:   Assessment: 67 yoM with PMH CHF, Afib previously on Eliquis, bioprosthetic AVR, HLD, DM2, admitted with scrotal/groin swelling; found to be in [facility] with AKI, possibly cardiorenal. Pharmacy to start heparin for Afib in setting of AKI.   Baseline INR, aPTT: not done  Prior anticoagulation: Eliquis 2.5 mg bid; LD 10/18 - has been off most other meds for almost 2 months d/t being unable/unwilling to fill Rx. Appears Eliquis Rx was for 5 mg bid, but suspect patient may have been splitting tabs to prolong supply.   Significant events:  Today, 10/26/2019:  CBC: WNL  SCr acutely elevated d/t possible cardiorenal syndrome  No bleeding or infusion issues per nursing  Goal of Therapy: Heparin level 0.3-0.7 units/ml Monitor platelets by anticoagulation protocol: Yes  Plan:  Heparin 4000 units IV bolus x 1  Heparin 1200 units/hr IV  infusion  Check heparin level 8 hrs after start; will add aPTT as last dose >48 hr ago, but given new AKI, may still be some residual DOAC accumulation and resultant effect on anti-Xa level which could confound dosing by heparin levels.  Daily CBC, daily heparin level once stable  Monitor for signs of bleeding or thrombosis  Will need to ensure patient able to afford selected anticoagulant prior to discharge - no point in prescribing something patient is unwilling/unable to fill.  Reuel Boom, PharmD, BCPS [phone] 11/21/2019, 4:09 PM

## 2019-11-14 NOTE — ED Notes (Signed)
Report called to Tess on 4W

## 2019-11-14 NOTE — ED Triage Notes (Signed)
Presents to ED via EMS with c/o testicular pain and swelling for one week. Denies any other complaints

## 2019-11-14 NOTE — ED Provider Notes (Signed)
[facility] DEPT Provider Note   CSN: [id number] Arrival date & time: 11/07/2019  3244     History Chief Complaint  Patient presents with  . Groin Swelling    Kyle Newton is a 64 y.o. male.  HPI Patient presents with swelling in his scrotum and penis.  Reportedly has been going for couple weeks now.  However is been off his medicines for least a couple months.  History of CHF.  States does have some difficulty urinating.  States he is more short of breath also.  Has edema on his legs also.  Get seen at the [facility] but has not been seen there for a while.  No fevers.  Denies chest pain but states does feel short of breath at times.    Past Medical History:  Diagnosis Date  . CHF (congestive heart failure) ([place])   . Diabetes mellitus without complication ([place])   . Hypertension     Patient Active Problem List   Diagnosis Date Noted  . Acute exacerbation of CHF (congestive heart failure) ([place]) 04/26/2019  . 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 History of seizures 04/26/2019  . Diabetes mellitus type 2 with complications ([place]) 01/26/7251  . Community acquired pneumonia 04/26/2019  . Acute renal failure superimposed on stage 3a chronic kidney disease ([place]) 04/26/2019  . Atrial fibrillation ([place]) 04/26/2019  . Elevated troponin 04/26/2019  . Hyperkalemia   . Hypotension 06/23/2017  . Scabies 06/21/2017  . Hyperglycemia 06/17/2017    Past Surgical History:  Procedure Laterality Date  . CARDIAC VALVE REPLACEMENT         Family History  Problem Relation Age of Onset  . Heart failure Mother   . Diabetes Mother   . Diabetes Father     Social History   Tobacco Use  . Smoking status: Current Every Day Smoker    Packs/day: 1.00    Years: 44.00    Pack years: 44.00    Types: Cigarettes  . Smokeless tobacco: Never Used  Vaping Use  . Vaping Use: Never used  Substance Use Topics  . Alcohol use: Not Currently    . Drug use: Not Currently    Types: Marijuana, Cocaine    Home Medications Prior to Admission medications   Medication Sig Start Date End Date Taking? Authorizing Provider  apixaban (ELIQUIS) 5 MG TABS tablet Take 1 tablet (5 mg total) by mouth 2 (two) times daily. 05/01/19 07/30/19  [place] Donnamarie Poag, DO  atorvastatin (LIPITOR) 10 MG tablet Take 1 tablet (10 mg total) by mouth daily. 05/01/19 04/30/20  [place] Donnamarie Poag, DO  carvedilol (COREG) 3.125 MG tablet Take 1 tablet (3.125 mg total) by mouth 2 (two) times daily with a meal. 05/01/19 07/30/19  [place] Donnamarie Poag, DO  furosemide (LASIX) 40 MG tablet Take 1 tablet (40 mg total) by mouth 2 (two) times daily. 05/01/19 07/30/19  [place] Donnamarie Poag, DO  glipiZIDE (GLUCOTROL) 5 MG tablet Take 1 tablet (5 mg total) by mouth daily before breakfast. 05/01/19 07/30/19  [place] Donnamarie Poag, DO  hydrALAZINE (APRESOLINE) 25 MG tablet Take 1 tablet (25 mg total) by mouth every 8 (eight) hours. 05/01/19 07/30/19  [place] Donnamarie Poag, DO  isosorbide mononitrate (IMDUR) 30 MG 24 hr tablet Take 1 tablet (30 mg total) by mouth daily. 05/02/19 07/31/19  [place] Eric J, DO  Semaglutide,0.25 or 0.5MG /DOS, 2 MG/1.5ML SOPN Inject 0.5 mg into the skin once a week.    [provider]  Allergies    Patient has no known allergies.  Review of Systems   Review of Systems  Constitutional: Positive for diaphoresis. Negative for appetite change and fever.  HENT: Negative for congestion.   Respiratory: Positive for shortness of breath.   Cardiovascular: Positive for leg swelling.  Gastrointestinal: Positive for abdominal pain.  Genitourinary: Positive for difficulty urinating, penile swelling and scrotal swelling.  Musculoskeletal: Negative for back pain.  Skin: Negative for rash.  Neurological: Negative for weakness.  Psychiatric/Behavioral: Negative for confusion.    Physical Exam Updated Vital Signs BP (!) 155/90   Pulse 65   Temp (!) 97.5 F (36.4 C)   Resp 19   SpO2 98%    Physical Exam Vitals reviewed.  Constitutional:      General: Distressed: .V.  HENT:     Head: Normocephalic.  Eyes:     Pupils: Pupils are equal, round, and reactive to light.  Cardiovascular:     Rate and Rhythm: Rhythm irregular.  Pulmonary:     Comments: Rales bilateral bases. Abdominal:     Comments:   Anasarca up to abdomen with some distention and umbilical hernia.  Genitourinary:    Comments: Large amount of edema on both penis and scrotum.  Some tenderness but not frank erythema. Musculoskeletal:        General: Tenderness present.     Cervical back: Neck supple.     Comments:   Anasarca up to abdomen.  Neurological:     Mental Status: He is alert and oriented to person, place, and time.     ED Results / Procedures / Treatments   Labs (all labs ordered are listed, but only abnormal results are displayed) Labs Reviewed  COMPREHENSIVE METABOLIC PANEL - Abnormal; Notable for the following components:      Result Value   Potassium 6.1 (*)    CO2 17 (*)    Glucose, Bld 112 (*)    BUN 70 (*)    Creatinine, Ser 3.92 (*)    Total Protein 9.0 (*)    Albumin 3.3 (*)    Alkaline Phosphatase 164 (*)    GFR, Estimated 16 (*)    All other components within normal limits  CBC WITH DIFFERENTIAL/PLATELET - Abnormal; Notable for the following components:   RDW 18.2 (*)    All other components within normal limits  BRAIN NATRIURETIC PEPTIDE - Abnormal; Notable for the following components:   B Natriuretic Peptide 489.6 (*)    All other components within normal limits  TROPONIN I (HIGH SENSITIVITY) - Abnormal; Notable for the following components:   Troponin I (High Sensitivity) 43 (*)    All other components within normal limits  RESPIRATORY PANEL BY RT PCR (FLU A&B, COVID)  URINALYSIS, ROUTINE W REFLEX MICROSCOPIC  TROPONIN I (HIGH SENSITIVITY)    EKG EKG Interpretation  Date/Time:  Thursday November 14 2019 10:28:54 EDT Ventricular Rate:  60 PR Interval:    QRS  Duration: 164 QT Interval:  498 QTC Calculation: 498 R Axis:   -109 Text Interpretation: Atrial flutter Ventricular premature complex Nonspecific IVCD with LAD Lateral infarct, old Probable anteroseptal infarct, recent some beats may be paced Confirmed by Davonna Belling [id number]) on 11/16/2019 11:07:09 AM   Radiology DG Chest Portable 1 View  Result Date: 11/19/2019 CLINICAL DATA:  Worsening shortness of breath. EXAM: PORTABLE CHEST 1 VIEW COMPARISON:  04/26/2019 FINDINGS: The patient is rotated to the right with similar appearance of cardiomegaly. A pacemaker remains in place. There is a persistent  small to moderate-sized right pleural effusion. Opacity in the right mid lung and right lower lung is at least partially chronic based on a 2019 study. The left lung is clear. No pneumothorax is identified. No acute osseous abnormality is seen. IMPRESSION: 1. Cardiomegaly with a persistent small to moderate-sized right pleural effusion. 2. Right mid and lower lung opacities favored to reflect scarring and atelectasis although superimposed infection is not excluded. Electronically Signed   By: Logan Bores M.D.   On: 11/03/2019 11:43    Procedures Procedures (including critical care time)  Medications Ordered in ED Medications - No data to display  ED Course  I have reviewed the triage vital signs and the nursing notes.  Pertinent labs & imaging results that were available during my care of the patient were reviewed by me and considered in my medical decision making (see chart for details).    MDM Rules/Calculators/A&P                          Patient with anasarca.  Does have a fair amount of swelling in penis and scrotum.  Also anasarca from about abdomen down.  Noncompliant with his medication regimen.  Creatinine has increased to almost 4.  Baseline appears to be in the low twos with recent admission for similar symptoms.  Mild hyperkalemia.  A. fib at baseline.  Not on his  anticoagulation.  X-ray shows pleural effusion and cardiomegaly.  Likely atelectasis do not think it is pneumonia on the x-ray.  Will require admission the hospital.  Will discuss with hospitalist. Imaging and lab work independently reviewed and interpreted.  I feel as if the CHF and worsening renal function are likely due to noncompliance potentially cardio renal syndrome.  We will get bladder scan to make sure that he does have urine output.  CRITICAL CARE Performed by: Davonna Belling Total critical care time: 30 minutes Critical care time was exclusive of separately billable procedures and treating other patients. Critical care was necessary to treat or prevent imminent or life-threatening deterioration. Critical care was time spent personally by me on the following activities: development of treatment plan with patient and/or surrogate as well as nursing, discussions with consultants, evaluation of patient's response to treatment, examination of patient, obtaining history from patient or surrogate, ordering and performing treatments and interventions, ordering and review of laboratory studies, ordering and review of radiographic studies, pulse oximetry and re-evaluation of patient's condition.  Final Clinical Impression(s) / ED Diagnoses Final diagnoses:  AKI (acute kidney injury) ([place])  Hyperkalemia  Congestive heart failure, unspecified HF chronicity, unspecified heart failure type ([place])  Noncompliance with medication regimen  Anasarca    Rx / DC Orders ED Discharge Orders    None       Davonna Belling, MD 11/16/2019 1205

## 2019-11-14 NOTE — Consult Note (Addendum)
Cardiology Consultation:   Patient ID: Kyle Newton MRN: [id number]; DOB: 06-May-1955  Admit date: 11/01/2019 Date of Consult: 10/30/2019  Primary Care Provider: Patient, No Pcp Per [facility] Cardiologist: Pixie Casino, MD  [facility] Electrophysiologist:  None    Patient Profile:   Kyle Newton is a 64 y.o. male with a hx of systolic heart failure s/p AICD, history of AVR in 2006 with mild AS on TTE in 04/2019, permanent atrial fib/flutter s/p AVN abliation and DCPM, HTN and DMII who is being seen today for the evaluation of acute on chronic HF exacerbation at the request of Dr. Marylyn Ishihara.  History of Present Illness:   [name] is a 64 year old male with history of systolic heart failure s/p AICD, HTN and DMII who presented to the ED with scrotal swelling and pain found to be volume overloaded with concern for acute on chronic HF exacerbation.   Patient states that he has been having worsening scrotal swelling, LE edema, and DOE over the past couple of weeks. Admits that he has not been taking his medications as prescribed. He decided to come to the ED today due to worsening pain where labs notable for BNP 489, Cr 3.92 (from 2.3), trop 43. CXR with right sided pleural effusion. ECG with Aflutter, IVCD, prior anterior and lateral infarcts and PVC. Denies any chest pain, nausea, vomiting, or abdominal pain. No fevers or chills.  Was admitted back in 04/2019 for acute on chronic HF requiring several days of IV diuresis.   Past Medical History:  Diagnosis Date  . CHF (congestive heart failure) ([place])   . Diabetes mellitus without complication ([place])   . Hypertension     Past Surgical History:  Procedure Laterality Date  . CARDIAC VALVE REPLACEMENT       Home Medications:  Prior to Admission medications   Medication Sig Start Date End Date Taking? Authorizing Provider  atorvastatin (LIPITOR) 10 MG tablet Take 1 tablet (10 mg total) by mouth daily. Patient not taking:  Reported on 11/24/2019 05/01/19 04/30/20  [place] Eric J, DO  isosorbide mononitrate (IMDUR) 30 MG 24 hr tablet Take 1 tablet (30 mg total) by mouth daily. 05/02/19 07/31/19  [place] Eric J, DO    Inpatient Medications: Scheduled Meds:  Continuous Infusions:  PRN Meds:   Allergies:   No Known Allergies  Social History:   Social History   Socioeconomic History  . Marital status: Widowed    Spouse name: Not on file  . Number of children: Not on file  . Years of education: Not on file  . Highest education level: Not on file  Occupational History  . Not on file  Tobacco Use  . Smoking status: Current Every Day Smoker    Packs/day: 1.00    Years: 44.00    Pack years: 44.00    Types: Cigarettes  . Smokeless tobacco: Never Used  Vaping Use  . Vaping Use: Never used  Substance and Sexual Activity  . Alcohol use: Not Currently  . Drug use: Not Currently    Types: Marijuana, Cocaine  . Sexual activity: Not on file  Other Topics Concern  . Not on file  Social History Narrative  . Not on file   Social Determinants of Health   Financial Resource Strain:   . Difficulty of Paying Living Expenses: Not on file  Food Insecurity:   . Worried About Charity fundraiser in the Last Year: Not on file  . Ran Out of Food  in the Last Year: Not on file  Transportation Needs:   . Lack of Transportation (Medical): Not on file  . Lack of Transportation (Non-Medical): Not on file  Physical Activity:   . Days of Exercise per Week: Not on file  . Minutes of Exercise per Session: Not on file  Stress:   . Feeling of Stress : Not on file  Social Connections:   . Frequency of Communication with Friends and Family: Not on file  . Frequency of Social Gatherings with Friends and Family: Not on file  . Attends Religious Services: Not on file  . Active Member of Clubs or Organizations: Not on file  . Attends Archivist Meetings: Not on file  . Marital Status: Not on file  Intimate Partner  Violence:   . Fear of Current or Ex-Partner: Not on file  . Emotionally Abused: Not on file  . Physically Abused: Not on file  . Sexually Abused: Not on file    Family History:    Family History  Problem Relation Age of Onset  . Heart failure Mother   . Diabetes Mother   . Diabetes Father      ROS:  Please see the history of present illness.  Denies any chest pain, nausea, vomiting, abdominal pain, fevers or chills.     Physical Exam/Data:   Vitals:   11/13/2019 1000 11/21/2019 1256 11/13/2019 1310  BP: (!) 155/90 (!) 148/91 (!) 139/93  Pulse: 65 62 60  Resp: 19 14 17   Temp: (!) 97.5 F (36.4 C)    SpO2: 98% 97% 97%   No intake or output data in the 24 hours ending 11/07/2019 1425 Last 3 Weights 05/01/2019 04/30/2019 04/29/2019  Weight (lbs) 177 lb 8 oz 182 lb 1.6 oz 189 lb 8 oz  Weight (kg) 80.513 kg 82.6 kg 85.957 kg     There is no height or weight on file to calculate BMI.  General:  Laying in bed, NAD HEENT: normal Neck: +JVD Cardiac:  Irregularly irregular, 2/6 systolic murmur best heard at RUSB Lungs:  Diminshed breath sounds at right lung base Abd: soft, nontender, no hepatomegaly  Ext: 1+ pitting edema Musculoskeletal:  No deformities, BUE and BLE strength normal and equal Skin: Scabbing on the legs bileterally. Warm and dry Neuro:  CNs 2-12 intact, no focal abnormalities noted   EKG:  The EKG was personally reviewed and demonstrates:  Aflutter with IVCD, PVC, anterior and lateral infarct pattern Telemetry:  Telemetry was personally reviewed and demonstrates:  Aflutter with HR 60  Relevant CV Studies: Echocardiogram 04/27/2019: Impressions: 1. Left ventricular ejection fraction, by estimation, is 25 to 30%. The  left ventricle has severely decreased function. The left ventricle  demonstrates global hypokinesis. There is mild left ventricular  hypertrophy. Septal, distal anteroseptal and  apical dyskinesis.  2. Right ventricular systolic function is mildly reduced.  The right  ventricular size is moderately enlarged. An AICD wire is visualized. There  is moderately elevated pulmonary artery systolic pressure.  3. The mitral valve is abnormal. Trivial mitral valve regurgitation.  4. The tricuspid valve is abnormal. Tricuspid valve regurgitation is  moderate.  5. The aortic valve has been repaired/replaced. Aortic valve  regurgitation is not visualized. Moderate aortic valve stenosis. There is  a bioprosthetic valve present in the aortic position. Aortic valve mean  gradient measures 14.0 mmHg.  6. Left atrial size was moderately dilated.  7. Right atrial size was moderately dilated  TTE 2019: - Left ventricle: The  cavity size was normal. Wall thickness was  increased in a pattern of mild LVH. Systolic function was mildly  reduced. The estimated ejection fraction was in the range of 45%  to 50%. There is hypokinesis of the mid-apicalanteroseptal  myocardium. The study is not technically sufficient to allow  evaluation of LV diastolic function.  - Ventricular septum: Septal motion showed abnormal function and  dyssynergy.  - Aortic valve: A bioprosthesis was present. Peak velocity (S): 323  cm/s. Mean gradient (S): 16 mm Hg. Valve area (VTI): 1.48 cm^2.  Valve area (Vmax): 1.23 cm^2. Valve area (Vmean): 1.35 cm^2.  - Mitral valve: Moderately thickened annulus. Structurally normal  valve.  - Left atrium: The atrium was moderately dilated.  - Right atrium: The atrium was moderately dilated.  - Pulmonary arteries: Systolic pressure was mildly increased.   Laboratory Data:  High Sensitivity Troponin:   Recent Labs  Lab 11/19/2019 1031  TROPONINIHS 43*     Chemistry Recent Labs  Lab 10/29/2019 1031  NA 135  K 6.1*  CL 110  CO2 17*  GLUCOSE 112*  BUN 70*  CREATININE 3.92*  CALCIUM 9.1  GFRNONAA 16*  ANIONGAP 8    Recent Labs  Lab 11/18/2019 1031  PROT 9.0*  ALBUMIN 3.3*  AST 35  ALT 23  ALKPHOS 164*  BILITOT  1.2   Hematology Recent Labs  Lab 11/15/2019 1031  WBC 4.4  RBC 4.82  HGB 13.7  HCT 43.8  MCV 90.9  MCH 28.4  MCHC 31.3  RDW 18.2*  PLT 166   BNP Recent Labs  Lab 11/10/2019 1031  BNP 489.6*    DDimer No results for input(s): DDIMER in the last 168 hours.   Radiology/Studies:  DG Chest Portable 1 View  Result Date: 11/01/2019 CLINICAL DATA:  Worsening shortness of breath. EXAM: PORTABLE CHEST 1 VIEW COMPARISON:  04/26/2019 FINDINGS: The patient is rotated to the right with similar appearance of cardiomegaly. A pacemaker remains in place. There is a persistent small to moderate-sized right pleural effusion. Opacity in the right mid lung and right lower lung is at least partially chronic based on a 2019 study. The left lung is clear. No pneumothorax is identified. No acute osseous abnormality is seen. IMPRESSION: 1. Cardiomegaly with a persistent small to moderate-sized right pleural effusion. 2. Right mid and lower lung opacities favored to reflect scarring and atelectasis although superimposed infection is not excluded. Electronically Signed   By: Logan Bores M.D.   On: 11/13/2019 11:43     Assessment and Plan:   Acute on Chronic Combined CHF s/p ICD Patient with history of systolic heart failure with TTE on last admission demonstrating LVEF of 25-30% with global hypokinesis and septal, distal anteroseptal, and apical dyskinesis as well as grade III diastolic dysfunction. Now with worsening volume overload in the setting of medication noncompliance consistent with acute on chronic HF exacerbation. BNP on admission 489.6. - TTE 04/21: LVEF of 25-30% with global hypokinesis and septal, distal anteroseptal, and apical dyskinesis as well as grade III diastolic dysfunction. RV moderately enlarged with mildly reduced systolic function. Moderate TR.  - Start lasix 80mg  IV BID - No ACEi/ARB/Entresto or Spironolactone due to renal function. - Continue Coreg 3.125mg  twice daily.  -  Continue Imdur 30mg  daily.  - Continue daily weights, strict I/O's - Low Na diet - Need to continue to encourage medication compliance  Elevated Troponin  Demand in setting of acute on chronic HF exacerbation. -Manage HF as above  History of  Aortic Valve Replacement Patient with mild stenosis on recent TTE. Will need serial monitoring as out-patient  - Continue to monitor as outpatient.   Chronic Atrial Fibrillation/Flutter CHADs-vasc 6. Chronic with biatrial enlargement. Unlikely to maintain sinus rhythm and therefore DCCV has not bee pursued.  - Continue Coreg 3.125mg  twice daily - Hold apixaban given AKI - Start heparin gtt for now while awaiting improvement in renal function  AKI on CKD Cr 3.92 from 2.3 on prior admission. Likely cardiorenal in the setting of volume overload and medication non-compliance. -Diuresis with lasix 80mg  IV BID -Monitor with diuresis -I/Os and daily weights -Renally dose medications  S/p [employer] ICD - Interrogate device  Hypertension Elevated likely due to medication non-compliance. -Restart home meds   New York Heart Association (NYHA) Functional Class NYHA Class III     :[id number]}  CHA2DS2-VASc Score = 6  This indicates a 9.7% annual risk of stroke. The patient's score is based upon: CHF History: 1 HTN History: 1 Diabetes History: 1 Stroke History: 2 Vascular Disease History: 1 Age Score: 0 Gender Score: 0       For questions or updates, please contact [facility] Please consult [email or web address] for contact info under    Signed, Freada Bergeron, MD  11/10/2019 2:25 PM

## 2019-11-14 NOTE — Plan of Care (Signed)

## 2019-11-14 NOTE — H&P (Signed)
History and Physical    Shaunte Weissinger JEH:[id number] DOB: 06/30/1955 DOA: 11/23/2019  PCP: Patient, No Pcp Per  Patient coming from: Home  Chief Complaint: Swelling  HPI: Jahquan Klugh is a 64 y.o. male with medical history significant of CHF and DM2. Presenting with 2 weeks of scrotal/penis swelling. He denies any specific injury, discharge, or inability to urinate. He denies any specific treatment for it at home. He denies any aggravating or alleviating factors. He does note some increase in dyspnea, increase in leg swelling, and increase in fatigue during this time. Denies reports non-compliance with his home medications. He decided that he had suffered long enough and came to the ED to get checked out.    ED Course: Lab work showed increased Scr, BNP, and K+. Exam showed anasarca. TRH was called for admission.    Review of Systems:  Denies CP, N/V/D, ab pain. Reports dyspnea, fatigue. Remainder of review of systems is otherwise negative for all not mentioned in HPI.   PMHx Past Medical History:  Diagnosis Date  . CHF (congestive heart failure) ([place])   . Diabetes mellitus without complication ([place])   . Hypertension     PSHx Past Surgical History:  Procedure Laterality Date  . CARDIAC VALVE REPLACEMENT      SocHx  reports that he has been smoking cigarettes. He has a 44.00 pack-year smoking history. He has never used smokeless tobacco. He reports previous alcohol use. He reports previous drug use. Drugs: Marijuana and Cocaine.  No Known Allergies  FamHx Family History  Problem Relation Age of Onset  . Heart failure Mother   . Diabetes Mother   . Diabetes Father     Prior to Admission medications   Medication Sig Start Date End Date Taking? Authorizing Provider  apixaban (ELIQUIS) 5 MG TABS tablet Take 1 tablet (5 mg total) by mouth 2 (two) times daily. 05/01/19 07/30/19  [place] Donnamarie Poag, DO  atorvastatin (LIPITOR) 10 MG tablet Take 1 tablet (10 mg total) by mouth daily.  05/01/19 04/30/20  [place] Don[place]arie Poag, DO  carvedilol (COREG) 3.125 MG tablet Take 1 tablet (3.125 mg total) by mouth 2 (two) times daily with a meal. 05/01/19 07/30/19  [place] Donnamarie Poag, DO  furosemide (LASIX) 40 MG tablet Take 1 tablet (40 mg total) by mouth 2 (two) times daily. 05/01/19 07/30/19  [place] Donnamarie Poag, DO  glipiZIDE (GLUCOTROL) 5 MG tablet Take 1 tablet (5 mg total) by mouth daily before breakfast. 05/01/19 07/30/19  [place] Donnamarie Poag, DO  hydrALAZINE (APRESOLINE) 25 MG tablet Take 1 tablet (25 mg total) by mouth every 8 (eight) hours. 05/01/19 07/30/19  [place] Donnamarie Poag, DO  isosorbide mononitrate (IMDUR) 30 MG 24 hr tablet Take 1 tablet (30 mg total) by mouth daily. 05/02/19 07/31/19  [place] Eric J, DO  Semaglutide,0.25 or 0.5MG/DOS, 2 MG/1.5ML SOPN Inject 0.5 mg into the skin once a week.    [provider]    Physical Exam: Vitals:   11/03/2019 1000  BP: (!) 155/90  Pulse: 65  Resp: 19  Temp: (!) 97.5 F (36.4 C)  SpO2: 98%    General: 64 y.o. male resting in bed in NAD Eyes: PERRL, normal sclera ENMT: Nares patent w/o discharge, orophaynx clear, dentition normal, ears w/o discharge/lesions/ulcers Neck: Supple, trachea midline Cardiovascular: RRR, +S1, S2, no m/g/r, equal pulses throughout Respiratory: decreased at bases, no w/r/r, normal WOB GI: BS+, NDNT, no masses noted, no organomegaly noted MSK: No c/c; BLE edema GU: scrotal/penius swelling w/o erythema  or drainage, TTP Skin: No rashes, bruises, ulcerations noted; BLE chronic venous changes noted Neuro: A&O x 3, no focal deficits Psyc: Appropriate interaction and affect, calm/cooperative  Labs on Admission: I have personally reviewed following labs and imaging studies  CBC: Recent Labs  Lab 11/19/2019 1031  WBC 4.4  NEUTROABS 2.7  HGB 13.7  HCT 43.8  MCV 90.9  PLT 250   Basic Metabolic Panel: Recent Labs  Lab 11/06/2019 1031  NA 135  K 6.1*  CL 110  CO2 17*  GLUCOSE 112*  BUN 70*  CREATININE 3.92*  CALCIUM 9.1    GFR: CrCl cannot be calculated (Unknown ideal weight.). Liver Function Tests: Recent Labs  Lab 10/31/2019 1031  AST 35  ALT 23  ALKPHOS 164*  BILITOT 1.2  PROT 9.0*  ALBUMIN 3.3*   No results for input(s): LIPASE, AMYLASE in the last 168 hours. No results for input(s): AMMONIA in the last 168 hours. Coagulation Profile: No results for input(s): INR, PROTIME in the last 168 hours. Cardiac Enzymes: No results for input(s): CKTOTAL, CKMB, CKMBINDEX, TROPONINI in the last 168 hours. BNP (last 3 results) No results for input(s): PROBNP in the last 8760 hours. HbA1C: No results for input(s): HGBA1C in the last 72 hours. CBG: No results for input(s): GLUCAP in the last 168 hours. Lipid Profile: No results for input(s): CHOL, HDL, LDLCALC, TRIG, CHOLHDL, LDLDIRECT in the last 72 hours. Thyroid Function Tests: No results for input(s): TSH, T4TOTAL, FREET4, T3FREE, THYROIDAB in the last 72 hours. Anemia Panel: No results for input(s): VITAMINB12, FOLATE, FERRITIN, TIBC, IRON, RETICCTPCT in the last 72 hours. Urine analysis:    Component Value Date/Time   COLORURINE YELLOW 04/27/2019 1742   APPEARANCEUR CLEAR 04/27/2019 1742   LABSPEC 1.009 04/27/2019 1742   PHURINE 5.0 04/27/2019 1742   GLUCOSEU NEGATIVE 04/27/2019 1742   HGBUR SMALL (A) 04/27/2019 1742   BILIRUBINUR NEGATIVE 04/27/2019 1742   KETONESUR NEGATIVE 04/27/2019 1742   PROTEINUR 100 (A) 04/27/2019 1742   NITRITE NEGATIVE 04/27/2019 1742   LEUKOCYTESUR NEGATIVE 04/27/2019 1742    Radiological Exams on Admission: DG Chest Portable 1 View  Result Date: 11/13/2019 CLINICAL DATA:  Worsening shortness of breath. EXAM: PORTABLE CHEST 1 VIEW COMPARISON:  04/26/2019 FINDINGS: The patient is rotated to the right with similar appearance of cardiomegaly. A pacemaker remains in place. There is a persistent small to moderate-sized right pleural effusion. Opacity in the right mid lung and right lower lung is at least partially  chronic based on a 2019 study. The left lung is clear. No pneumothorax is identified. No acute osseous abnormality is seen. IMPRESSION: 1. Cardiomegaly with a persistent small to moderate-sized right pleural effusion. 2. Right mid and lower lung opacities favored to reflect scarring and atelectasis although superimposed infection is not excluded. Electronically Signed   By: Logan Bores M.D.   On: 11/19/2019 11:43    EKG: Independently reviewed. A fib  Assessment/Plan Anasarca/Scrotal Swelling Acute on chronic combined HF     - admit to inpatient, tele     - received IV lasix 71m x 1 in ED; continue BID dosing and follow SCr     - fluid restriction to 1200cc, Na+ restriction     - counseled on medical compliance     - check echo     - have consulted cardiology for HF team     - he does have some tenderness w/ palpation of scrotum, no erythema or discharge, check scrotal [place] DM2     -  SSI, DM diet, CBG, A1c  HLD     - continue home lipitor  AKI on CKD 3b     - he will be on lasix; watch SCr, watch other nephrotoxins  Elevated Alk phos     - check ggt; other liver panel results are ok; follow  Hyperkalemia     - getting lasix; add lokelma  DVT prophylaxis: heparin  Code Status: FULL  Family Communication: None at bedside.  Consults called: Cardiology consulted.  Admission status: Inpatient   Status is: Inpatient  Remains inpatient appropriate because:Inpatient level of care appropriate due to severity of illness   Dispo: The patient is from: Home              Anticipated d/c is to: Home              Anticipated d/c date is: 3 days              Patient currently is not medically stable to d/c.  Jonnie Finner DO [facility]  If 7PM-7AM, please contact night-coverage [email or web address]  10/25/2019, 12:47 PM

## 2019-11-15 ENCOUNTER — Encounter (HOSPITAL_COMMUNITY): Payer: Self-pay | Admitting: Internal Medicine

## 2019-11-15 ENCOUNTER — Inpatient Hospital Stay (HOSPITAL_COMMUNITY): Payer: Medicare Other

## 2019-11-15 DIAGNOSIS — I5043 Acute on chronic combined systolic (congestive) and diastolic (congestive) heart failure: Secondary | ICD-10-CM | POA: Diagnosis not present

## 2019-11-15 DIAGNOSIS — E875 Hyperkalemia: Secondary | ICD-10-CM

## 2019-11-15 DIAGNOSIS — N179 Acute kidney failure, unspecified: Secondary | ICD-10-CM | POA: Diagnosis not present

## 2019-11-15 DIAGNOSIS — Z7189 Other specified counseling: Secondary | ICD-10-CM

## 2019-11-15 DIAGNOSIS — I509 Heart failure, unspecified: Secondary | ICD-10-CM | POA: Diagnosis not present

## 2019-11-15 DIAGNOSIS — Z515 Encounter for palliative care: Secondary | ICD-10-CM

## 2019-11-15 DIAGNOSIS — I5021 Acute systolic (congestive) heart failure: Secondary | ICD-10-CM

## 2019-11-15 DIAGNOSIS — R601 Generalized edema: Secondary | ICD-10-CM | POA: Diagnosis not present

## 2019-11-15 LAB — ECHOCARDIOGRAM COMPLETE
AV Mean grad: 14.3 mmHg
AV Peak grad: 24.1 mmHg
Ao pk vel: 2.46 m/s
Area-P 1/2: 2.91 cm2
Height: 69 in
S' Lateral: 3.85 cm
Weight: 3376 oz

## 2019-11-15 LAB — HEPARIN LEVEL (UNFRACTIONATED)
Heparin Unfractionated: 0.1 IU/mL — ABNORMAL LOW (ref 0.30–0.70)
Heparin Unfractionated: 0.3 IU/mL (ref 0.30–0.70)
Heparin Unfractionated: 0.32 IU/mL (ref 0.30–0.70)
Heparin Unfractionated: 0.4 IU/mL (ref 0.30–0.70)

## 2019-11-15 LAB — COMPREHENSIVE METABOLIC PANEL
ALT: 21 U/L (ref 0–44)
ALT: 20 U/L (ref 0–44)
AST: 29 U/L (ref 15–41)
AST: 29 U/L (ref 15–41)
Albumin: 2.9 g/dL — ABNORMAL LOW (ref 3.5–5.0)
Albumin: 3 g/dL — ABNORMAL LOW (ref 3.5–5.0)
Alkaline Phosphatase: 140 U/L — ABNORMAL HIGH (ref 38–126)
Alkaline Phosphatase: 151 U/L — ABNORMAL HIGH (ref 38–126)
Anion gap: 7 (ref 5–15)
Anion gap: 11 (ref 5–15)
BUN: 70 mg/dL — ABNORMAL HIGH (ref 8–23)
BUN: 74 mg/dL — ABNORMAL HIGH (ref 8–23)
CO2: 18 mmol/L — ABNORMAL LOW (ref 22–32)
CO2: 17 mmol/L — ABNORMAL LOW (ref 22–32)
Calcium: 8.8 mg/dL — ABNORMAL LOW (ref 8.9–10.3)
Calcium: 9.1 mg/dL (ref 8.9–10.3)
Chloride: 112 mmol/L — ABNORMAL HIGH (ref 98–111)
Chloride: 110 mmol/L (ref 98–111)
Creatinine, Ser: 4.05 mg/dL — ABNORMAL HIGH (ref 0.61–1.24)
Creatinine, Ser: 4.23 mg/dL — ABNORMAL HIGH (ref 0.61–1.24)
GFR, Estimated: 16 mL/min — ABNORMAL LOW (ref >60)
GFR, Estimated: 15 mL/min — ABNORMAL LOW (ref >60)
Glucose, Bld: 150 mg/dL — ABNORMAL HIGH (ref 70–99)
Glucose, Bld: 130 mg/dL — ABNORMAL HIGH (ref 70–99)
Potassium: 6.5 mmol/L (ref 3.5–5.1)
Potassium: 6.4 mmol/L (ref 3.5–5.1)
Sodium: 137 mmol/L (ref 135–145)
Sodium: 138 mmol/L (ref 135–145)
Total Bilirubin: 1.1 mg/dL (ref 0.3–1.2)
Total Bilirubin: 0.8 mg/dL (ref 0.3–1.2)
Total Protein: 7.7 g/dL (ref 6.5–8.1)
Total Protein: 7.9 g/dL (ref 6.5–8.1)

## 2019-11-15 LAB — GLUCOSE, CAPILLARY
Glucose-Capillary: 111 mg/dL — ABNORMAL HIGH (ref 70–99)
Glucose-Capillary: 107 mg/dL — ABNORMAL HIGH (ref 70–99)
Glucose-Capillary: 69 mg/dL — ABNORMAL LOW (ref 70–99)
Glucose-Capillary: 67 mg/dL — ABNORMAL LOW (ref 70–99)
Glucose-Capillary: 90 mg/dL (ref 70–99)
Glucose-Capillary: 70 mg/dL (ref 70–99)
Glucose-Capillary: 132 mg/dL — ABNORMAL HIGH (ref 70–99)
Glucose-Capillary: 127 mg/dL — ABNORMAL HIGH (ref 70–99)

## 2019-11-15 LAB — CBC
HCT: 38.8 % — ABNORMAL LOW (ref 39.0–52.0)
Hemoglobin: 12 g/dL — ABNORMAL LOW (ref 13.0–17.0)
MCH: 28.6 pg (ref 26.0–34.0)
MCHC: 30.9 g/dL (ref 30.0–36.0)
MCV: 92.4 fL (ref 80.0–100.0)
Platelets: 156 10*3/uL (ref 150–400)
RBC: 4.2 MIL/uL — ABNORMAL LOW (ref 4.22–5.81)
RDW: 18.2 % — ABNORMAL HIGH (ref 11.5–15.5)
WBC: 4 10*3/uL (ref 4.0–10.5)
nRBC: 0.5 % — ABNORMAL HIGH (ref 0.0–0.2)

## 2019-11-15 LAB — BASIC METABOLIC PANEL
Anion gap: 8 (ref 5–15)
BUN: 77 mg/dL — ABNORMAL HIGH (ref 8–23)
CO2: 16 mmol/L — ABNORMAL LOW (ref 22–32)
Calcium: 8.8 mg/dL — ABNORMAL LOW (ref 8.9–10.3)
Chloride: 113 mmol/L — ABNORMAL HIGH (ref 98–111)
Creatinine, Ser: 4.19 mg/dL — ABNORMAL HIGH (ref 0.61–1.24)
GFR, Estimated: 15 mL/min — ABNORMAL LOW (ref >60)
Glucose, Bld: 83 mg/dL (ref 70–99)
Potassium: 5.7 mmol/L — ABNORMAL HIGH (ref 3.5–5.1)
Sodium: 137 mmol/L (ref 135–145)

## 2019-11-15 LAB — APTT: aPTT: 77 seconds — ABNORMAL HIGH (ref 24–36)

## 2019-11-15 LAB — GAMMA GT: GGT: 70 U/L — ABNORMAL HIGH (ref 7–50)

## 2019-11-15 MED ORDER — INSULIN ASPART 100 UNIT/ML IV SOLN
10.0000 [IU] | Freq: Once | INTRAVENOUS | Status: AC
  Administered 2019-11-15: 10 [IU] via INTRAVENOUS

## 2019-11-15 MED ORDER — HYDRALAZINE HCL 50 MG PO TABS: 50.0000 mg | ORAL_TABLET | Freq: Three times a day (TID) | ORAL | Status: DC

## 2019-11-15 MED ORDER — DEXTROSE 50 % IV SOLN
1.0000 | Freq: Once | INTRAVENOUS | Status: AC
  Administered 2019-11-15: 50 mL via INTRAVENOUS
  Filled 2019-11-15: qty 50

## 2019-11-15 MED ORDER — FUROSEMIDE 10 MG/ML IJ SOLN
160.0000 mg | Freq: Three times a day (TID) | INTRAVENOUS | Status: DC
  Administered 2019-11-16 – 2019-11-17 (×4): 160 mg via INTRAVENOUS
  Filled 2019-11-15: qty 4
  Filled 2019-11-15: qty 16
  Filled 2019-11-15: qty 10
  Filled 2019-11-15: qty 2
  Filled 2019-11-15: qty 10

## 2019-11-15 MED ORDER — SODIUM CHLORIDE 0.9 % IV SOLN: INTRAVENOUS | Status: DC | PRN

## 2019-11-15 MED ORDER — SODIUM ZIRCONIUM CYCLOSILICATE 10 G PO PACK
10.0000 g | PACK | Freq: Once | ORAL | Status: AC
  Administered 2019-11-15: 10 g via ORAL
  Filled 2019-11-15: qty 1

## 2019-11-15 MED ORDER — HEPARIN BOLUS VIA INFUSION
2500.0000 [IU] | Freq: Once | INTRAVENOUS | Status: AC
  Administered 2019-11-15: 2500 [IU] via INTRAVENOUS
  Filled 2019-11-15: qty 2500

## 2019-11-15 MED ORDER — CALCIUM GLUCONATE-NACL 1-0.675 GM/50ML-% IV SOLN
1.0000 g | Freq: Once | INTRAVENOUS | Status: AC
  Administered 2019-11-15: 1000 mg via INTRAVENOUS
  Filled 2019-11-15: qty 50

## 2019-11-15 MED ORDER — HYDRALAZINE HCL 25 MG PO TABS
25.0000 mg | ORAL_TABLET | Freq: Three times a day (TID) | ORAL | Status: DC
  Administered 2019-11-15 – 2019-11-16 (×2): 25 mg via ORAL
  Filled 2019-11-15 (×2): qty 1

## 2019-11-15 NOTE — Progress Notes (Signed)
  Echocardiogram 2D Echocardiogram has been performed.  Jennette Dubin 11/15/2019, 11:12 AM

## 2019-11-15 NOTE — Progress Notes (Signed)
Progress Note  Patient Name: Kyle Newton Date of Encounter: 11/15/2019  Primary Cardiologist: Pixie Casino, MD   Subjective  Complaining of being hungry. Continues to have LE edema, abdominal distension.  Cr rising to 4; nephrology consulted. Only net negative 517. Inpatient Medications    Scheduled Meds: . atorvastatin  10 mg Oral Daily  . carvedilol  3.125 mg Oral BID WC  . furosemide  80 mg Intravenous BID  . hydrALAZINE  25 mg Oral Q8H  . insulin aspart  0-15 Units Subcutaneous TID WC  . isosorbide mononitrate  30 mg Oral Daily  . sodium zirconium cyclosilicate  10 g Oral BID   Continuous Infusions: . sodium chloride 10 mL/hr at 11/15/19 0306  . heparin 1,500 Units/hr (11/15/19 0624)   PRN Meds: sodium chloride, acetaminophen **OR** acetaminophen, ondansetron **OR** ondansetron (ZOFRAN) IV   Vital Signs    Vitals:   11/19/2019 1844 11/08/2019 2306 11/15/19 0302 11/15/19 0655  BP: (!) 133/98 (!) 151/93 124/78 (!) 143/88  Pulse: 66 62 60 64  Resp: 19 20 18 18   Temp: 97.6 F (36.4 C) 97.7 F (36.5 C) 97.7 F (36.5 C) 97.8 F (36.6 C)  TempSrc: Oral   Oral  SpO2: 99% 100% 100% 99%  Weight:      Height:        Intake/Output Summary (Last 24 hours) at 11/15/2019 1328 Last data filed at 11/15/2019 1610 Gross per 24 hour  Intake 132.42 ml  Output 650 ml  Net -517.58 ml   Filed Weights   11/10/2019 1525  Weight: 95.7 kg    Telemetry    A-sensed V-paced; 1 degree AVB, frequent PVCs - Personally Reviewed  ECG    New ECG ordered- Personally Reviewed  Physical Exam   GEN: Laying in bed, comfortable  Neck: +JVD at 30 degrees Cardiac:  RR, no murmur Respiratory: Crackles at bases, otherwise clear GI: Soft, distended, +BS  MS: Warm, tense 2+ pitting edema. Neuro:  Nonfocal, moving all extremities spontaneously  Labs    Chemistry Recent Labs  Lab 11/13/2019 1031 11/09/2019 1031 11/15/19 0021 11/15/19 0610 11/15/19 1107  NA 135   < > 137 138 137   K 6.1*   < > 6.5* 6.4* 5.7*  CL 110   < > 112* 110 113*  CO2 17*   < > 18* 17* 16*  GLUCOSE 112*   < > 150* 130* 83  BUN 70*   < > 70* 74* 77*  CREATININE 3.92*   < > 4.05* 4.23* 4.19*  CALCIUM 9.1   < > 8.8* 9.1 8.8*  PROT 9.0*  --  7.7 7.9  --   ALBUMIN 3.3*  --  2.9* 3.0*  --   AST 35  --  29 29  --   ALT 23  --  21 20  --   ALKPHOS 164*  --  140* 151*  --   BILITOT 1.2  --  1.1 0.8  --   GFRNONAA 16*   < > 16* 15* 15*  ANIONGAP 8   < > 7 11 8    < > = values in this interval not displayed.     Hematology Recent Labs  Lab 11/16/2019 1031 11/15/19 0021  WBC 4.4 4.0  RBC 4.82 4.20*  HGB 13.7 12.0*  HCT 43.8 38.8*  MCV 90.9 92.4  MCH 28.4 28.6  MCHC 31.3 30.9  RDW 18.2* 18.2*  PLT 166 156    Cardiac EnzymesNo results for input(s): TROPONINI  in the last 168 hours. No results for input(s): TROPIPOC in the last 168 hours.   BNP Recent Labs  Lab 11/04/2019 1031  BNP 489.6*     DDimer No results for input(s): DDIMER in the last 168 hours.   Radiology    US SCROTUM  Result Date: 11/24/2019 CLINICAL DATA:  Scrotal edema. EXAM: ULTRASOUND OF SCROTUM TECHNIQUE: Complete ultrasound examination of the testicles, epididymis, and other scrotal structures was performed. COMPARISON:  None. FINDINGS: Right testicle Measurements: 4.0 cm x 2.6 cm x 2.4 cm. A 6 mm x 4 mm x 4 mm calcification is seen adjacent to the superolateral aspect of the right testicle. Left testicle Measurements: 5.4 cm x 2.4 cm x 2.1 cm. No mass or microlithiasis visualized. Right epididymis:  Normal in size and appearance. Left epididymis:  Normal in size and appearance. Hydrocele: A small amount of fluid is seen adjacent to the bilateral epididymal heads. Varicocele:  None visualized. Other: Marked severity scrotal edema is seen. IMPRESSION: Marked severity scrotal edema with a small amount of bilateral peri epididymal fluid. Electronically Signed   By: Virgina Norfolk M.D.   On: 11/19/2019 20:52   DG Chest  Portable 1 View  Result Date: 10/31/2019 CLINICAL DATA:  Worsening shortness of breath. EXAM: PORTABLE CHEST 1 VIEW COMPARISON:  04/26/2019 FINDINGS: The patient is rotated to the right with similar appearance of cardiomegaly. A pacemaker remains in place. There is a persistent small to moderate-sized right pleural effusion. Opacity in the right mid lung and right lower lung is at least partially chronic based on a 2019 study. The left lung is clear. No pneumothorax is identified. No acute osseous abnormality is seen. IMPRESSION: 1. Cardiomegaly with a persistent small to moderate-sized right pleural effusion. 2. Right mid and lower lung opacities favored to reflect scarring and atelectasis although superimposed infection is not excluded. Electronically Signed   By: Logan Bores M.D.   On: 11/18/2019 11:43   ECHOCARDIOGRAM COMPLETE  Result Date: 11/15/2019    ECHOCARDIOGRAM REPORT   Patient Name:   Kyle Newton Date of Exam: 11/15/2019 Medical Rec #:  [id number]     Height:       69.0 in Accession #:    [id number]    Weight:       211.0 lb Date of Birth:  04-Dec-1955    BSA:          2.114 m Patient Age:    64 years      BP:           143/88 mmHg Patient Gender: M             HR:           64 bpm. Exam Location:  Inpatient Procedure: 2D Echo Indications:    CHF- Acute Systolic W29.56  History:        Patient has prior history of Echocardiogram examinations, most                 recent 04/27/2019. Risk Factors:Hypertension and Diabetes.                 Aortic Valve: bioprosthetic valve is present in the aortic                 position.  Sonographer:    Mikki Santee RDCS (AE) Referring Phys: [id number] [place]  1. Left ventricular ejection fraction, by estimation, is 35 to 40%. The left ventricle has moderately decreased function. The left  ventricle has no regional wall motion abnormalities. Left ventricular diastolic function could not be evaluated. There is akinesis of the left ventricular,  entire apical segment. There is akinesis of the left ventricular, apical lateral wall, inferolateral wall, septal wall, inferior wall and anterior wall. There is akinesis of the left ventricular, mid anteroseptal wall.  2. Right ventricular systolic function is mildly reduced. The right ventricular size is normal. There is moderately elevated pulmonary artery systolic pressure. The estimated right ventricular systolic pressure is 51.8 mmHg.  3. Left atrial size was severely dilated.  4. Right atrial size was moderately dilated.  5. The mitral valve is normal in structure. Trivial mitral valve regurgitation. No evidence of mitral stenosis. Severe mitral annular calcification.  6. The aortic valve has been repaired/replaced. Aortic valve regurgitation is not visualized. No aortic stenosis is present. There is a bioprosthetic valve present in the aortic position. Echo findings are consistent with normal structure and function of the aortic valve prosthesis.  7. The inferior vena cava is dilated in size with <50% respiratory variability, suggesting right atrial pressure of 15 mmHg.  8. Recommend limited study with definity to more accurately define wall motion FINDINGS  Left Ventricle: Left ventricular ejection fraction, by estimation, is 35 to 40%. The left ventricle has moderately decreased function. The left ventricle has no regional wall motion abnormalities. The left ventricular internal cavity size was normal in size. There is no left ventricular hypertrophy. Abnormal (paradoxical) septal motion, consistent with RV pacemaker. Left ventricular diastolic function could not be evaluated due to paced rhythm. Left ventricular diastolic function could not be evaluated. Right Ventricle: The right ventricular size is normal. No increase in right ventricular wall thickness. Right ventricular systolic function is mildly reduced. There is moderately elevated pulmonary artery systolic pressure. The tricuspid regurgitant  velocity is 2.85 m/s, and with an assumed right atrial pressure of 15 mmHg, the estimated right ventricular systolic pressure is 84.1 mmHg. Left Atrium: Left atrial size was severely dilated. Right Atrium: Right atrial size was moderately dilated. Pericardium: There is no evidence of pericardial effusion. Mitral Valve: The mitral valve is normal in structure. Severe mitral annular calcification. Trivial mitral valve regurgitation. No evidence of mitral valve stenosis. MV peak gradient, 12.6 mmHg. The mean mitral valve gradient is 3.0 mmHg. Tricuspid Valve: The tricuspid valve is normal in structure. Tricuspid valve regurgitation is mild . No evidence of tricuspid stenosis. Aortic Valve: The aortic valve has been repaired/replaced. Aortic valve regurgitation is not visualized. No aortic stenosis is present. Aortic valve mean gradient measures 14.2 mmHg. Aortic valve peak gradient measures 24.1 mmHg. There is a bioprosthetic  valve present in the aortic position. Echo findings are consistent with normal structure and function of the aortic valve prosthesis. Pulmonic Valve: The pulmonic valve was normal in structure. Pulmonic valve regurgitation is not visualized. No evidence of pulmonic stenosis. Aorta: The aortic root is normal in size and structure. Venous: The inferior vena cava is dilated in size with less than 50% respiratory variability, suggesting right atrial pressure of 15 mmHg. IAS/Shunts: No atrial level shunt detected by color flow Doppler. Additional Comments: A pacer wire is visualized.  LEFT VENTRICLE PLAX 2D LVIDd:         4.71 cm Diastology LVIDs:         3.85 cm LV e' medial:    5.03 cm/s LV PW:         1.10 cm LV E/e' medial:  24.3 LV IVS:  0.89 cm LV e' lateral:   6.38 cm/s                        LV E/e' lateral: 19.1  RIGHT VENTRICLE TAPSE (M-mode): 1.6 cm LEFT ATRIUM              Index       RIGHT ATRIUM           Index LA diam:        4.90 cm  2.32 cm/m  RA Area:     27.00 cm LA Vol  (A2C):   86.0 ml  40.69 ml/m RA Volume:   93.60 ml  44.29 ml/m LA Vol (A4C):   138.0 ml 65.29 ml/m LA Biplane Vol: 111.0 ml 52.52 ml/m  AORTIC VALVE AV Vmax:           245.50 cm/s AV Vmean:          170.750 cm/s AV VTI:            0.532 m AV Peak Grad:      24.1 mmHg AV Mean Grad:      14.2 mmHg LVOT Vmax:         57.90 cm/s LVOT Vmean:        40.400 cm/s LVOT VTI:          0.126 m LVOT/AV VTI ratio: 0.24  AORTA Ao Root diam: 3.30 cm MITRAL VALVE                TRICUSPID VALVE MV Area (PHT): 2.91 cm     TR Peak grad:   32.5 mmHg MV Peak grad:  12.6 mmHg    TR Vmax:        285.00 cm/s MV Mean grad:  3.0 mmHg MV Vmax:       1.78 m/s     SHUNTS MV Vmean:      77.5 cm/s    Systemic VTI: 0.13 m MV Decel Time: 261 msec MV E velocity: 122.00 cm/s MV A velocity: 57.50 cm/s MV E/A ratio:  2.12 Fransico Him MD Electronically signed by Fransico Him MD Signature Date/Time: 11/15/2019/11:38:00 AM    Final     Cardiac Studies   Echocardiogram 04/27/2019: Impressions: 1. Left ventricular ejection fraction, by estimation, is 25 to 30%. The  left ventricle has severely decreased function. The left ventricle  demonstrates global hypokinesis. There is mild left ventricular  hypertrophy. Septal, distal anteroseptal and  apical dyskinesis.  2. Right ventricular systolic function is mildly reduced. The right  ventricular size is moderately enlarged. An AICD wire is visualized. There  is moderately elevated pulmonary artery systolic pressure.  3. The mitral valve is abnormal. Trivial mitral valve regurgitation.  4. The tricuspid valve is abnormal. Tricuspid valve regurgitation is  moderate.  5. The aortic valve has been repaired/replaced. Aortic valve  regurgitation is not visualized. Moderate aortic valve stenosis. There is  a bioprosthetic valve present in the aortic position. Aortic valve mean  gradient measures 14.0 mmHg.  6. Left atrial size was moderately dilated.  7. Right atrial size was moderately  dilated  TTE 2019: - Left ventricle: The cavity size was normal. Wall thickness was  increased in a pattern of mild LVH. Systolic function was mildly  reduced. The estimated ejection fraction was in the range of 45%  to 50%. There is hypokinesis of the mid-apicalanteroseptal  myocardium. The study is not technically sufficient to allow  evaluation of LV  diastolic function.  - Ventricular septum: Septal motion showed abnormal function and  dyssynergy.  - Aortic valve: A bioprosthesis was present. Peak velocity (S): 323  cm/s. Mean gradient (S): 16 mm Hg. Valve area (VTI): 1.48 cm^2.  Valve area (Vmax): 1.23 cm^2. Valve area (Vmean): 1.35 cm^2.  - Mitral valve: Moderately thickened annulus. Structurally normal  valve.  - Left atrium: The atrium was moderately dilated.  - Right atrium: The atrium was moderately dilated.  - Pulmonary arteries: Systolic pressure was mildly increased.    Patient Profile     64 y.o. male with a PMH of chronic combined CHF s/p AICD, AVR in 2006 with mild AS on TTE in 04/2019, permanent atrial fib/flutter s/p AVN abliation and DCPM, HTN and DMII who is being followed by cardiology for the evaluation of acute on chronic HF exacerbation  Assessment & Plan    1. Acute on chronic combined CHF: patient presented with DOE, LE edema, and scrotal edema, worsening over the past couple weeks. He reported non-compliance with his medication. EF 25-30% on echo 04/2019. BNP 489. Cr 3.92 on admission (up from baseline 2.3). HsTrop 43 (no trend). EKG was non-ischemic. He was started on IV lasix 80mg  BID with UOP -548mL. Weight 211lbs yesterday; await trend. Now with Cr up to 4.23 today. Lasix on hold per primary team - Would consider nephrology consult to assist with diuresis in the setting of worsening renal function - Likely needs higher diuretic dosing as grossly overloaded; will await nephrology input - Continue carvedilol - Unable to add  ACEi/ARB/ARNI/Spironolactone in the setting of baseline CKD - Continue fluid restriction, low sodium diet, daily weights, and strict I&Os - Unknown if ischemic work-up in the past and patient does not recall (suspect prior to AVR) however he is notably non-compliant with medications and therefore not a good PCI candidate as high risk of stent thrombosis  2. Permanent atrial fibrillation/flutter: rates are stable. DCCV not pursued as it was unlikely he would maintain sinus rhythm given chronic biatrial enlargement. He was transitioned from apixaban to heparin gtt in light of his AoCKD.  - Continue carvedilol for rate control - Continue heparin gtt for stroke ppx  3. AoCKD stage 3: Cr continues to uptrend from 3.92 on admission, up to 4.23 today with efforts to diurese.  - Would consider nephrology consult to assist with diuresis in the setting of worsening renal function - Continue to avoid nephrotoxic agents  4. HTN: BP is generally above goal. Patient with medication non-compliance. Just restarted on carvedilol 3.125mg  BID and hydralazine 25mg  TID yesterday - Will monitor response to medications closely - if remains elevated tomorrow would increase hydralazine to 50mg  TID - Continue carvedilol, hydralazine, and imdur  5. S/p medtronic ICD:  - Will follow-up on device interrogation  6. History of aortic valve replacement: with mild AS on echo 04/2019.  - Continue routine outpatient monitoring  For questions or updates, please contact [place] Please consult [email or web address] for contact info under Cardiology/STEMI.      Signed, Freada Bergeron, MD  11/15/2019, 1:28 PM   [phone]

## 2019-11-15 NOTE — Progress Notes (Signed)
Pharmacy - IV heparin  Assessment:    Please see note from Theodis Shove, PharmD earlier today for full details.  Briefly, 64 y.o. male on IV heparin for Afib in setting of AKI.   Most recent heparin level therapeutic but borderline low at 0.30 on 1500 units/hr  No bleeding per RN, but did note that heparin line was frequently getting occluded with patient bending arm  Plan:   Given possibility of occluded heparin line, will continue at current rate of 1500 units/hr rather than increasing rate  Recheck heparin level with AM labs  Daily heparin level  Reuel Boom, PharmD, BCPS [phone] 11/15/2019, 4:37 PM

## 2019-11-15 NOTE — Progress Notes (Signed)
[place] for IV heparin Indication: atrial fibrillation  No Known Allergies  Patient Measurements: Height: 5\' 9"  (175.3 cm) Weight: 95.7 kg (211 lb) IBW/kg (Calculated) : 70.7 Heparin Dosing Weight: 91 kg  Vital Signs: Temp: 97.7 F (36.5 C) (10/21 2306) Temp Source: Oral (10/21 1844) BP: 151/93 (10/21 2306) Pulse Rate: 62 (10/21 2306)  Labs: Recent Labs    11/05/2019 1031 11/15/19 0021  HGB 13.7 12.0*  HCT 43.8 38.8*  PLT 166 156  HEPARINUNFRC  --  0.10*  CREATININE 3.92*  --   TROPONINIHS 43*  --     Estimated Creatinine Clearance: 21.7 mL/min (A) (by C-G formula based on SCr of 3.92 mg/dL (H)).   Medical History: Past Medical History:  Diagnosis Date  . CHF (congestive heart failure) ([place])   . Diabetes mellitus without complication ([place])   . Hypertension     Medications:  Medications Prior to Admission  Medication Sig Dispense Refill Last Dose  . atorvastatin (LIPITOR) 10 MG tablet Take 1 tablet (10 mg total) by mouth daily. (Patient not taking: Reported on 11/24/2019) 30 tablet 11 Not Taking at Unknown time  . isosorbide mononitrate (IMDUR) 30 MG 24 hr tablet Take 1 tablet (30 mg total) by mouth daily. 90 tablet 0    Scheduled:  . atorvastatin  10 mg Oral Daily  . carvedilol  3.125 mg Oral BID WC  . furosemide  80 mg Intravenous BID  . hydrALAZINE  25 mg Oral Q8H  . insulin aspart  0-15 Units Subcutaneous TID WC  . isosorbide mononitrate  30 mg Oral Daily  . sodium zirconium cyclosilicate  10 g Oral BID   Infusions:  . heparin 1,200 Units/hr (10/30/2019 1659)   PRN:   Assessment: 39 yoM with PMH CHF, Afib previously on Eliquis, bioprosthetic AVR, HLD, DM2, admitted with scrotal/groin swelling; found to be in [facility] with AKI, possibly cardiorenal. Pharmacy to start heparin for Afib in setting of AKI.   Baseline INR, aPTT:   Prior anticoagulation: Eliquis 2.5 mg bid; LD 10/18 - has been off most other meds for  almost 2 months d/t being unable/unwilling to fill Rx. Appears Eliquis Rx was for 5 mg bid, but suspect patient may have been splitting tabs to prolong supply.   Significant events:  Today, 11/15/2019:  HL 0.1, subtherapeutic, aPTT 77  Hgb 12, Plts WNL  SCr acutely elevated d/t possible cardiorenal syndrome  No bleeding or infusion issues noted  Goal of Therapy: Heparin level 0.3-0.7 units/ml Monitor platelets by anticoagulation protocol: Yes  Plan:  Heparin 2500 units IV bolus x 1  Increase Heparin to 1500 units/hr  Check heparin level 8 hrs after change  Daily CBC, daily heparin level once stable  Monitor for signs of bleeding or thrombosis  Will need to ensure patient able to afford selected anticoagulant prior to discharge - no point in prescribing something patient is unwilling/unable to fill.  Dolly Rias RPh 11/15/2019, 1:15 AM

## 2019-11-15 NOTE — Consult Note (Addendum)
Renal Service Consult Note [facility]  Kyle Newton 11/15/2019 Kyle Blazing, MD Requesting Physician:  Dr Rodena Piety, E.   Reason for Consult:  Renal failure.  HPI: The patient is a 64 y.o. year-old w/ hx of HTN, DM2 , heart valve replacement and CHF who presented to ED yesterday c/o scrotal swelling and pain, also DOE worsening and worsening leg swelling. Fatigued. In ED exam showed ansarca, labs showed BUN 70 , Cr 3.92, K 6.1, CO2 17, Na 135. Alb 3.3. Hb 12. UA negative, 100 prot. Asked to see for renal failure.   Pt lethargic, poor historian, denies CP or SOB.   Pt voided into a urinal about 300 cc this am per the RN.         Past Medical History  Past Medical History:  Diagnosis Date  . CHF (congestive heart failure) ([place])   . Diabetes mellitus without complication ([place])   . Hypertension    Past Surgical History  Past Surgical History:  Procedure Laterality Date  . CARDIAC VALVE REPLACEMENT     Family History  Family History  Problem Relation Age of Onset  . Heart failure Mother   . Diabetes Mother   . Diabetes Father    Social History  reports that he has been smoking cigarettes. He has a 44.00 pack-year smoking history. He has never used smokeless tobacco. He reports previous alcohol use. He reports previous drug use. Drugs: Marijuana and Cocaine. Allergies No Known Allergies Home medications Prior to Admission medications   Medication Sig Start Date End Date Taking? Authorizing Provider  atorvastatin (LIPITOR) 10 MG tablet Take 1 tablet (10 mg total) by mouth daily. Patient not taking: Reported on 11/12/2019 05/01/19 04/30/20  [place] Eric J, DO  isosorbide mononitrate (IMDUR) 30 MG 24 hr tablet Take 1 tablet (30 mg total) by mouth daily. 05/02/19 07/31/19  [place] Eric J, DO     Vitals:   10/25/2019 2306 11/15/19 0302 11/15/19 0655 11/15/19 1346  BP: (!) 151/93 124/78 (!) 143/88 107/78  Pulse: 62 60 64 61  Resp: 20 18 18 18   Temp: 97.7 F (36.5  C) 97.7 F (36.5 C) 97.8 F (36.6 C) 98.1 F (36.7 C)  TempSrc:   Oral   SpO2: 100% 100% 99% 97%  Weight:      Height:       Exam Gen lethargic, arouses easily, no distress No rash, cyanosis or gangrene Sclera anicteric, throat clear  +JVD Chest clear bilat to bases, no rales RRR no MRG Abd soft ntnd no mass or ascites +bs GU quite swollen scrotum and penis  MS no joint effusions or deformity Ext diffuse tense 3+ LE /  abd wall/ hip edema Neuro is drowsy, moving all ext, Ox 2    Home meds:  - lipitor 10 /imdur 30 qd   CXR - IMPRESSION: 1. Cardiomegaly with a persistent small to moderate-sized right pleural effusion. 2. Right mid and lower lung opacities favored to reflect scarring and atelectasis although superimposed infection is not excluded.    BUN 70 , Cr 3.92, K 6.1, CO2 17, Na 135. Alb 3.3. Hb 12. UA negative, 100 prot.   BP 124/78  HR 71  RR 20  afebrile    ECHO 10/22 - LVEF 35-40%, no RWMA's, akinetic apical segment and also of LV apical lat/ inf-lat/ septal / inf and anterior walls. RV mildly reduced, mod ^PA pressures. Severe LAE, mod RAE. AV replaced/ reparied.      EKG - vent  paced rhythm. 80's.          Date   Creat  eGFR   May-June 2019 2.51 >> 1.57 AKI episode, eGFR 26 > 46   April 2021  3.04 >> 2.30 AKI episode, eGFR 21 > 29   Nov 14, 2019  3.92  16   Nov 15, 2019  4.19  15    Baseline creat - from 04/2019 = 2.3- 2.6, eGFR 25- 29 ml/min    UA negative, prot 100    UNa, UCr pending    Renal US pending  Assessment/ Plan: 1. AoCKD IV - baseline creat from 04/2019 = 2.3- 2.6, eGFR 25- 29 ml/min. Admit creat 3.9 here, up to 4.1 today.  Severe vol overload w/ tense edema from toes to chest wall, scrotal/ penile edema as well. Known hx of systolic CHF, low EF. Not great response to 40- 23m IV lasix, will start high-dose lasix IV 160 mg tid. Get bladder scan and renal [place] make sure not retaining. Pt lethargic but doubt uremic at this point.  Will follow. May need  RRT if not responding to lasix or MS declines.   2. HyperK+ - changed to renal/ carb mod diet, cont lokelma 3. A/C systolic CHF - LVEF 327-61%4. Perm afib/ flutter - believe on eliquis at home, IV hep here 5. HTN 6. SP ICD 7. Hx AVR - on IV hep      RKelly Splinter MD 11/15/2019, 2:22 PM  Recent Labs  Lab 11/08/2019 1031 11/15/19 0021  WBC 4.4 4.0  HGB 13.7 12.0*   Recent Labs  Lab 11/15/19 0610 11/15/19 1107  K 6.4* 5.7*  BUN 74* 77*  CREATININE 4.23* 4.19*  CALCIUM 9.1 8.8*

## 2019-11-15 NOTE — TOC Progression Note (Signed)
Transition of Care [facility]) - Progression Note    Patient Details  Name: Kyle Newton MRN: [id number] Date of Birth: 07-07-1955  [facility] CM/SW Contact  Purcell Mouton, RN Phone Number: 11/15/2019, 2:16 PM  Clinical Narrative:    Pt from home with spouse. TOC will continue to follow.   Expected Discharge Plan: Home/Self Care Barriers to Discharge: No Barriers Identified  Expected Discharge Plan and Services Expected Discharge Plan: Home/Self Care       Living arrangements for the past 2 months: Single Family Home                                       Social Determinants of Health (SDOH) Interventions    Readmission Risk Interventions No flowsheet data found.

## 2019-11-15 NOTE — Progress Notes (Signed)
ANTICOAGULATION CONSULT NOTE  Pharmacy Consult for IV heparin Indication: atrial fibrillation  No Known Allergies  Patient Measurements: Height: 5\' 9"  (175.3 cm) Weight: 95.7 kg (211 lb) IBW/kg (Calculated) : 70.7 Heparin Dosing Weight: 91 kg  Vital Signs: Temp: 97.8 F (36.6 C) (10/22 0655) Temp Source: Oral (10/22 0655) BP: 143/88 (10/22 0655) Pulse Rate: 64 (10/22 0655)  Labs: Recent Labs    11/08/2019 1031 11/21/2019 1031 11/15/19 0021 11/15/19 0610 11/15/19 1107 11/15/19 1110  HGB 13.7  --  12.0*  --   --   --   HCT 43.8  --  38.8*  --   --   --   PLT 166  --  156  --   --   --   APTT  --   --  77*  --   --   --   HEPARINUNFRC  --   --  0.10* 0.40  --  0.32  CREATININE 3.92*   < > 4.05* 4.23* 4.19*  --   TROPONINIHS 43*  --   --   --   --   --    < > = values in this interval not displayed.    Estimated Creatinine Clearance: 20.3 mL/min (A) (by C-G formula based on SCr of 4.19 mg/dL (H)).   Medical History: Past Medical History:  Diagnosis Date  . CHF (congestive heart failure) ([place])   . Diabetes mellitus without complication ([place])   . Hypertension     Medications:  Medications Prior to Admission  Medication Sig Dispense Refill Last Dose  . atorvastatin (LIPITOR) 10 MG tablet Take 1 tablet (10 mg total) by mouth daily. (Patient not taking: Reported on 11/03/2019) 30 tablet 11 Not Taking at Unknown time  . isosorbide mononitrate (IMDUR) 30 MG 24 hr tablet Take 1 tablet (30 mg total) by mouth daily. 90 tablet 0    Scheduled:  . atorvastatin  10 mg Oral Daily  . carvedilol  3.125 mg Oral BID WC  . furosemide  80 mg Intravenous BID  . hydrALAZINE  25 mg Oral Q8H  . insulin aspart  0-15 Units Subcutaneous TID WC  . isosorbide mononitrate  30 mg Oral Daily  . sodium zirconium cyclosilicate  10 g Oral BID   Infusions:  . sodium chloride 10 mL/hr at 11/15/19 0306  . heparin 1,500 Units/hr (11/15/19 0624)   PRN:   Assessment: 30 yoM with PMH CHF, Afib  previously on Eliquis, bioprosthetic AVR, HLD, DM2, admitted with scrotal/groin swelling; found to be in [facility] with AKI, possibly cardiorenal. Pharmacy to start heparin for Afib in setting of AKI.   Baseline INR, aPTT: n/a  Prior anticoagulation: Eliquis 2.5 mg bid; LD 10/18 - has been off most other meds for almost 2 months d/t being unable/unwilling to fill Rx. Appears Eliquis Rx was for 5 mg bid, but suspect patient may have been splitting tabs to prolong supply.   Significant events:  Today, 11/15/2019:  HL = 0.32 is therapeutic on heparin infusion of 1500 units/hr  HL that was obtained at 0610 was drawn too early and therefore not accurate. Repeat done and therapeutic  CBC: Hgb slightly low, Plt WNL  SCr 4.19, CrCl ~20 mL/min. AKI  Confirmed with RN that heparin infusing at correct rate. No interruptions. No signs of bleeding.  Goal of Therapy: Heparin level 0.3-0.7 units/ml Monitor platelets by anticoagulation protocol: Yes  Plan:  Continue heparin infusion at current rate of 1500 units/hr  Check confirmatory HL in 8 hours  Daily CBC, daily heparin level once stable  Monitor for signs of bleeding or thrombosis  Follow along for eventual transition to PO anticoagulation. Recommend case management consult to evaluate if any agents are cost prohibitive.   Lenis Noon, PharmD 11/15/19 1:08 PM

## 2019-11-15 NOTE — Progress Notes (Signed)
PROGRESS NOTE    Kyle Newton  GYI:[id number] DOB: 1956-01-02 DOA: 11/24/2019 PCP: Patient, No Pcp Per   Brief Narrative:64 y.o. male with medical history significant of CHF and DM2. Presenting with 2 weeks of scrotal/penis swelling. He denies any specific injury, discharge, or inability to urinate. He denies any specific treatment for it at home. He denies any aggravating or alleviating factors. He does note some increase in dyspnea, increase in leg swelling, and increase in fatigue during this time. Denies reports non-compliance with his home medications. He decided that he had suffered long enough and came to the ED to get checked out.   Patient with history of AICD and AVR in 2006 and AV nodal ablation for A. fib.  ED Course: Lab work showed increased Scr, BNP, and K+. Exam showed anasarca. TRH was called for admission.    Assessment & Plan:   Active Problems:   Anasarca    #1 Acute on chronic CHF Exacerbation-AICD-patient admitted with complaints of 2 weeks of scrotal swelling with associated dyspnea on exertion and lower extremity edema and fatigue. This is thought to be secondary to medication noncompliance.  Admission medications only shows Lipitor and Imdur. Negative by 5 1 7  cc Last echo from April 2021 shows ejection fraction 25 to 30%. BNP on admission was 489.6, troponin 43 Echo 11/15/2019 results pending On Coreg, Imdur, Lasix Chest x-ray 10/21- RIGHT PLEURAL EFFUSION CM  #2 type 2 dm is documented however he is not on any medicines at home for this.   CBG (last 3)  Recent Labs    11/19/2019 2120 11/15/19 0744 11/15/19 1121  GLUCAP 129* 107* 69*     #3 AKI on ckd stage 3 b nephrology consulted will defer to nephrology regarding diuresis.  #4 hyperlipedemia on Lipitor  #5 hyperkalemia potassium 6.4 on Lokelma. Added insulin and glucose and calcium gluconate repeat BMP.  #6 isolated elevation of alkaline phosphatase other LFTs normal.  #7 history of  essential hypertension continue current meds Coreg and Imdur.  #7 scrotal edema likely exacerbated by #1. Ultrasound shows marked severity scrotal edema with small amount of bilateral periepididymal fluid.  #8 history of polysubstance abuse check urine drug screen he has history of marijuana and cocaine abuse.  #9 history of aortic valve replacement needs outpatient follow-up.  #10 chronic atrial fibrillation/flutter- patient was on apixaban at home which is on hold due to AKI.  Currently on heparin drip.   Estimated body mass index is 31.16 kg/m as calculated from the following:   Height as of this encounter: 5\' 9"  (1.753 m).   Weight as of this encounter: 95.7 kg.  DVT prophylaxis: Heparin  Code Status: Full Family Communication: None at bedside Disposition Plan:  Status is: Inpatient   Dispo: The patient is from: Home              Anticipated d/c is to: Likely SNF              Anticipated d/c date is: > 3 days              Patient currently is not medically stable to d/c.    Consultants:   Nephrology and cardiology  Procedures: None Antimicrobials: None Subjective: Patient resting in bed he is awake partly snoring and talking he thinks he had a bad night he denies having a CPAP at home he thinks his breathing is worse he denies any chest pain  Objective: Vitals:   11/18/2019 1844 10/27/2019 2306 11/15/19 0302  11/15/19 0655  BP: (!) 133/98 (!) 151/93 124/78 (!) 143/88  Pulse: 66 62 60 64  Resp: 19 20 18 18   Temp: 97.6 F (36.4 C) 97.7 F (36.5 C) 97.7 F (36.5 C) 97.8 F (36.6 C)  TempSrc: Oral   Oral  SpO2: 99% 100% 100% 99%  Weight:      Height:        Intake/Output Summary (Last 24 hours) at 11/15/2019 1042 Last data filed at 11/15/2019 9163 Gross per 24 hour  Intake 132.42 ml  Output 650 ml  Net -517.58 ml   Filed Weights   11/10/2019 1525  Weight: 95.7 kg    Examination:  General exam: Appears calm and comfortable  Respiratory system: Diminished  breath sounds at the bases right more than left to auscultation. Respiratory effort normal. Cardiovascular system: S1 & S2 heard, RRR. No JVD, murmurs, rubs, gallops or clicks. No pedal edema. Gastrointestinal system: Abdomen is nondistended, soft and nontender. No organomegaly or masses felt. Normal bowel sounds heard.  Scrotal edema Central nervous system: Alert and oriented. No focal neurological deficits. Extremities trace pitting edema bilaterally Skin: No rashes, lesions or ulcers Psychiatry: Judgement and insight appear normal. Mood & affect appropriate.     Data Reviewed: I have personally reviewed following labs and imaging studies  CBC: Recent Labs  Lab 11/03/2019 1031 11/15/19 0021  WBC 4.4 4.0  NEUTROABS 2.7  --   HGB 13.7 12.0*  HCT 43.8 38.8*  MCV 90.9 92.4  PLT 166 846   Basic Metabolic Panel: Recent Labs  Lab 10/28/2019 1031 11/15/19 0021 11/15/19 0610  NA 135 137 138  K 6.1* 6.5* 6.4*  CL 110 112* 110  CO2 17* 18* 17*  GLUCOSE 112* 150* 130*  BUN 70* 70* 74*  CREATININE 3.92* 4.05* 4.23*  CALCIUM 9.1 8.8* 9.1   GFR: Estimated Creatinine Clearance: 20.1 mL/min (A) (by C-G formula based on SCr of 4.23 mg/dL (H)). Liver Function Tests: Recent Labs  Lab 11/07/2019 1031 11/15/19 0021 11/15/19 0610  AST 35 29 29  ALT 23 21 20   ALKPHOS 164* 140* 151*  BILITOT 1.2 1.1 0.8  PROT 9.0* 7.7 7.9  ALBUMIN 3.3* 2.9* 3.0*   No results for input(s): LIPASE, AMYLASE in the last 168 hours. No results for input(s): AMMONIA in the last 168 hours. Coagulation Profile: No results for input(s): INR, PROTIME in the last 168 hours. Cardiac Enzymes: No results for input(s): CKTOTAL, CKMB, CKMBINDEX, TROPONINI in the last 168 hours. BNP (last 3 results) No results for input(s): PROBNP in the last 8760 hours. HbA1C: No results for input(s): HGBA1C in the last 72 hours. CBG: Recent Labs  Lab 11/01/2019 2120 11/15/19 0744  GLUCAP 129* 107*   Lipid Profile: No results  for input(s): CHOL, HDL, LDLCALC, TRIG, CHOLHDL, LDLDIRECT in the last 72 hours. Thyroid Function Tests: No results for input(s): TSH, T4TOTAL, FREET4, T3FREE, THYROIDAB in the last 72 hours. Anemia Panel: No results for input(s): VITAMINB12, FOLATE, FERRITIN, TIBC, IRON, RETICCTPCT in the last 72 hours. Sepsis Labs: No results for input(s): PROCALCITON, LATICACIDVEN in the last 168 hours.  Recent Results (from the past 240 hour(s))  Respiratory Panel by RT PCR (Flu A&B, Covid) - Nasopharyngeal Swab     Status: None   Collection Time: 10/30/2019 10:33 AM   Specimen: Nasopharyngeal Swab  Result Value Ref Range Status   SARS Coronavirus 2 by RT PCR NEGATIVE NEGATIVE Final    Comment: (NOTE) SARS-CoV-2 target nucleic acids are NOT DETECTED.  The SARS-CoV-2 RNA is generally detectable in upper respiratoy specimens during the acute phase of infection. The lowest concentration of SARS-CoV-2 viral copies this assay can detect is 131 copies/mL. A negative result does not preclude SARS-Cov-2 infection and should not be used as the sole basis for treatment or other patient management decisions. A negative result may occur with  improper specimen collection/handling, submission of specimen other than nasopharyngeal swab, presence of viral mutation(s) within the areas targeted by this assay, and inadequate number of viral copies (<131 copies/mL). A negative result must be combined with clinical observations, patient history, and epidemiological information. The expected result is Negative.  Fact Sheet for Patients:  PinkCheek.be  Fact Sheet for Healthcare Providers:  GravelBags.it  This test is no t yet approved or cleared by the [place] FDA and  has been authorized for detection and/or diagnosis of SARS-CoV-2 by FDA under an Emergency Use Authorization (EUA). This EUA will remain  in effect (meaning this test can be used) for  the duration of the COVID-19 declaration under Section 564(b)(1) of the Act, 21 U.S.C. section 360bbb-3(b)(1), unless the authorization is terminated or revoked sooner.     Influenza A by PCR NEGATIVE NEGATIVE Final   Influenza B by PCR NEGATIVE NEGATIVE Final    Comment: (NOTE) The Xpert Xpress SARS-CoV-2/FLU/RSV assay is intended as an aid in  the diagnosis of influenza from Nasopharyngeal swab specimens and  should not be used as a sole basis for treatment. Nasal washings and  aspirates are unacceptable for Xpert Xpress SARS-CoV-2/FLU/RSV  testing.  Fact Sheet for Patients: PinkCheek.be  Fact Sheet for Healthcare Providers: GravelBags.it  This test is not yet approved or cleared by the [place] FDA and  has been authorized for detection and/or diagnosis of SARS-CoV-2 by  FDA under an Emergency Use Authorization (EUA). This EUA will remain  in effect (meaning this test can be used) for the duration of the  Covid-19 declaration under Section 564(b)(1) of the Act, 21  U.S.C. section 360bbb-3(b)(1), unless the authorization is  terminated or revoked. Performed at [facility], [place] [address], [place], [place] [address]          Radiology Studies: US SCROTUM  Result Date: 11/03/2019 CLINICAL DATA:  Scrotal edema. EXAM: ULTRASOUND OF SCROTUM TECHNIQUE: Complete ultrasound examination of the testicles, epididymis, and other scrotal structures was performed. COMPARISON:  None. FINDINGS: Right testicle Measurements: 4.0 cm x 2.6 cm x 2.4 cm. A 6 mm x 4 mm x 4 mm calcification is seen adjacent to the superolateral aspect of the right testicle. Left testicle Measurements: 5.4 cm x 2.4 cm x 2.1 cm. No mass or microlithiasis visualized. Right epididymis:  Normal in size and appearance. Left epididymis:  Normal in size and appearance. Hydrocele: A small amount of fluid is seen adjacent to the bilateral  epididymal heads. Varicocele:  None visualized. Other: Marked severity scrotal edema is seen. IMPRESSION: Marked severity scrotal edema with a small amount of bilateral peri epididymal fluid. Electronically Signed   By: Virgina Norfolk M.D.   On: 11/12/2019 20:52   DG Chest Portable 1 View  Result Date: 11/17/2019 CLINICAL DATA:  Worsening shortness of breath. EXAM: PORTABLE CHEST 1 VIEW COMPARISON:  04/26/2019 FINDINGS: The patient is rotated to the right with similar appearance of cardiomegaly. A pacemaker remains in place. There is a persistent small to moderate-sized right pleural effusion. Opacity in the right mid lung and right lower lung is at least partially chronic based on  a 2019 study. The left lung is clear. No pneumothorax is identified. No acute osseous abnormality is seen. IMPRESSION: 1. Cardiomegaly with a persistent small to moderate-sized right pleural effusion. 2. Right mid and lower lung opacities favored to reflect scarring and atelectasis although superimposed infection is not excluded. Electronically Signed   By: Logan Bores M.D.   On: 10/30/2019 11:43        Scheduled Meds: . atorvastatin  10 mg Oral Daily  . carvedilol  3.125 mg Oral BID WC  . furosemide  80 mg Intravenous BID  . hydrALAZINE  25 mg Oral Q8H  . insulin aspart  0-15 Units Subcutaneous TID WC  . isosorbide mononitrate  30 mg Oral Daily  . sodium zirconium cyclosilicate  10 g Oral BID   Continuous Infusions: . sodium chloride 10 mL/hr at 11/15/19 0306  . heparin 1,500 Units/hr (11/15/19 0624)     LOS: 1 day   Georgette Shell, MD  11/15/2019, 10:43 AM

## 2019-11-15 NOTE — Progress Notes (Signed)
RN Curt Bears and PCT Ash attempted to place foley and were unsuccessful X2. Pt penis is edematous and foley was not able to be placed. Pt not making urine with urinal on his own. On-call nephrologist [facility] paged X2 with no response as of 1942. Night RN aware that foley needs to be place in order to get urine specimens as well as start IV lasix.  Pt diet also did not allow for any more carbs or salt due to restrictions. Dinner tray not given to pt.  Both of these issues need to be addressed with nephrology who placed the foley and diet order. However, if not answer, floor on call MD may be able to help.

## 2019-11-16 DIAGNOSIS — N179 Acute kidney failure, unspecified: Secondary | ICD-10-CM

## 2019-11-16 DIAGNOSIS — I484 Atypical atrial flutter: Secondary | ICD-10-CM | POA: Diagnosis not present

## 2019-11-16 DIAGNOSIS — I10 Essential (primary) hypertension: Secondary | ICD-10-CM

## 2019-11-16 DIAGNOSIS — I5043 Acute on chronic combined systolic (congestive) and diastolic (congestive) heart failure: Secondary | ICD-10-CM

## 2019-11-16 DIAGNOSIS — R601 Generalized edema: Secondary | ICD-10-CM

## 2019-11-16 DIAGNOSIS — N184 Chronic kidney disease, stage 4 (severe): Secondary | ICD-10-CM

## 2019-11-16 LAB — CBC
HCT: 40.4 % (ref 39.0–52.0)
Hemoglobin: 12.3 g/dL — ABNORMAL LOW (ref 13.0–17.0)
MCH: 28.1 pg (ref 26.0–34.0)
MCHC: 30.4 g/dL (ref 30.0–36.0)
MCV: 92.2 fL (ref 80.0–100.0)
Platelets: 164 10*3/uL (ref 150–400)
RBC: 4.38 MIL/uL (ref 4.22–5.81)
RDW: 17.9 % — ABNORMAL HIGH (ref 11.5–15.5)
WBC: 4.4 10*3/uL (ref 4.0–10.5)
nRBC: 0 % (ref 0.0–0.2)

## 2019-11-16 LAB — COMPREHENSIVE METABOLIC PANEL WITH GFR
ALT: 21 U/L (ref 0–44)
AST: 31 U/L (ref 15–41)
Albumin: 3 g/dL — ABNORMAL LOW (ref 3.5–5.0)
Alkaline Phosphatase: 154 U/L — ABNORMAL HIGH (ref 38–126)
Anion gap: 12 (ref 5–15)
BUN: 80 mg/dL — ABNORMAL HIGH (ref 8–23)
CO2: 14 mmol/L — ABNORMAL LOW (ref 22–32)
Calcium: 8.9 mg/dL (ref 8.9–10.3)
Chloride: 112 mmol/L — ABNORMAL HIGH (ref 98–111)
Creatinine, Ser: 4.12 mg/dL — ABNORMAL HIGH (ref 0.61–1.24)
GFR, Estimated: 15 mL/min — ABNORMAL LOW
Glucose, Bld: 108 mg/dL — ABNORMAL HIGH (ref 70–99)
Potassium: 6 mmol/L — ABNORMAL HIGH (ref 3.5–5.1)
Sodium: 138 mmol/L (ref 135–145)
Total Bilirubin: 0.8 mg/dL (ref 0.3–1.2)
Total Protein: 7.8 g/dL (ref 6.5–8.1)

## 2019-11-16 LAB — GLUCOSE, CAPILLARY
Glucose-Capillary: 104 mg/dL — ABNORMAL HIGH (ref 70–99)
Glucose-Capillary: 126 mg/dL — ABNORMAL HIGH (ref 70–99)
Glucose-Capillary: 136 mg/dL — ABNORMAL HIGH (ref 70–99)

## 2019-11-16 LAB — CREATININE, URINE, RANDOM: Creatinine, Urine: 26.83 mg/dL

## 2019-11-16 LAB — SODIUM, URINE, RANDOM: Sodium, Ur: 124 mmol/L

## 2019-11-16 LAB — HEPARIN LEVEL (UNFRACTIONATED): Heparin Unfractionated: 0.38 IU/mL (ref 0.30–0.70)

## 2019-11-16 MED ORDER — INSULIN ASPART 100 UNIT/ML IV SOLN
10.0000 [IU] | Freq: Once | INTRAVENOUS | Status: AC
  Administered 2019-11-16: 10 [IU] via INTRAVENOUS

## 2019-11-16 MED ORDER — DEXTROSE 50 % IV SOLN
1.0000 | Freq: Once | INTRAVENOUS | Status: AC
  Administered 2019-11-16: 50 mL via INTRAVENOUS
  Filled 2019-11-16: qty 50

## 2019-11-16 MED ORDER — CALCIUM GLUCONATE-NACL 1-0.675 GM/50ML-% IV SOLN
1.0000 g | Freq: Once | INTRAVENOUS | Status: AC
  Administered 2019-11-16: 1000 mg via INTRAVENOUS
  Filled 2019-11-16: qty 50

## 2019-11-16 MED ORDER — METOLAZONE 5 MG PO TABS
10.0000 mg | ORAL_TABLET | Freq: Every day | ORAL | Status: DC
  Administered 2019-11-16 – 2019-11-18 (×3): 10 mg via ORAL
  Filled 2019-11-16 (×4): qty 2

## 2019-11-16 NOTE — Progress Notes (Signed)
Progress Note  Patient Name: Kyle Newton Date of Encounter: 11/16/2019  Primary Cardiologist: Pixie Casino, MD   Subjective  Sleeping comfortably this AM. Awakens easily. Feels less tight, thinks fluid is coming off. No chest pain, breathing is stable  Inpatient Medications    Scheduled Meds: . atorvastatin  10 mg Oral Daily  . carvedilol  3.125 mg Oral BID WC  . hydrALAZINE  25 mg Oral Q8H  . insulin aspart  0-15 Units Subcutaneous TID WC  . isosorbide mononitrate  30 mg Oral Daily  . sodium zirconium cyclosilicate  10 g Oral BID   Continuous Infusions: . sodium chloride 10 mL/hr at 11/15/19 0306  . furosemide 160 mg (11/16/19 0000)  . heparin 1,500 Units/hr (11/15/19 2326)   PRN Meds: sodium chloride, acetaminophen **OR** acetaminophen, ondansetron **OR** ondansetron (ZOFRAN) IV   Vital Signs    Vitals:   11/15/19 1346 11/15/19 1800 11/15/19 2205 11/16/19 0500  BP: 107/78  106/67 129/84  Pulse: 61  64 68  Resp: 18  18   Temp: 98.1 F (36.7 C)   97.6 F (36.4 C)  TempSrc:    Oral  SpO2: 97%  100%   Weight:  98.9 kg    Height:        Intake/Output Summary (Last 24 hours) at 11/16/2019 1034 Last data filed at 11/16/2019 0500 Gross per 24 hour  Intake 699.69 ml  Output 1445 ml  Net -745.31 ml   Filed Weights   11/05/2019 1525 11/15/19 1800  Weight: 95.7 kg 98.9 kg    Telemetry    Atypical atrial flutter with V pacing - Personally Reviewed  ECG    V paced, with what appears to be atypical atrial flutter as underlying rhythm- Personally Reviewed  Physical Exam   GEN: Well nourished, well developed in no acute distress NECK: JVD just above clavicle at 60 degrees CARDIAC: regular rhythm, normal S1 and S2, no rubs or gallops. No murmur appreciated. VASCULAR: Radial pulses 2+ bilaterally.  RESPIRATORY:  Largely clear in upper fields with scattered rhonchi, diminished at bilateral bases ABDOMEN: Soft, non-tender, non-distended MUSCULOSKELETAL:   Moves all 4 limbs independently SKIN: Warm and dry, 2+ bilateral pitting edema from feet to abdomen NEUROLOGIC:  No focal neuro deficits noted. PSYCHIATRIC:  Normal affect, though fatigued  Labs    Chemistry Recent Labs  Lab 11/15/19 0021 11/15/19 0021 11/15/19 0610 11/15/19 1107 11/16/19 0418  NA 137   < > 138 137 138  K 6.5*   < > 6.4* 5.7* 6.0*  CL 112*   < > 110 113* 112*  CO2 18*   < > 17* 16* 14*  GLUCOSE 150*   < > 130* 83 108*  BUN 70*   < > 74* 77* 80*  CREATININE 4.05*   < > 4.23* 4.19* 4.12*  CALCIUM 8.8*   < > 9.1 8.8* 8.9  PROT 7.7  --  7.9  --  7.8  ALBUMIN 2.9*  --  3.0*  --  3.0*  AST 29  --  29  --  31  ALT 21  --  20  --  21  ALKPHOS 140*  --  151*  --  154*  BILITOT 1.1  --  0.8  --  0.8  GFRNONAA 16*   < > 15* 15* 15*  ANIONGAP 7   < > 11 8 12    < > = values in this interval not displayed.     Hematology Recent Labs  Lab 11/10/2019 1031 11/15/19 0021 11/16/19 0418  WBC 4.4 4.0 4.4  RBC 4.82 4.20* 4.38  HGB 13.7 12.0* 12.3*  HCT 43.8 38.8* 40.4  MCV 90.9 92.4 92.2  MCH 28.4 28.6 28.1  MCHC 31.3 30.9 30.4  RDW 18.2* 18.2* 17.9*  PLT 166 156 164    Cardiac EnzymesNo results for input(s): TROPONINI in the last 168 hours. No results for input(s): TROPIPOC in the last 168 hours.   BNP Recent Labs  Lab 10/29/2019 1031  BNP 489.6*     DDimer No results for input(s): DDIMER in the last 168 hours.   Radiology    US SCROTUM  Result Date: 11/24/2019 CLINICAL DATA:  Scrotal edema. EXAM: ULTRASOUND OF SCROTUM TECHNIQUE: Complete ultrasound examination of the testicles, epididymis, and other scrotal structures was performed. COMPARISON:  None. FINDINGS: Right testicle Measurements: 4.0 cm x 2.6 cm x 2.4 cm. A 6 mm x 4 mm x 4 mm calcification is seen adjacent to the superolateral aspect of the right testicle. Left testicle Measurements: 5.4 cm x 2.4 cm x 2.1 cm. No mass or microlithiasis visualized. Right epididymis:  Normal in size and appearance.  Left epididymis:  Normal in size and appearance. Hydrocele: A small amount of fluid is seen adjacent to the bilateral epididymal heads. Varicocele:  None visualized. Other: Marked severity scrotal edema is seen. IMPRESSION: Marked severity scrotal edema with a small amount of bilateral peri epididymal fluid. Electronically Signed   By: Virgina Norfolk M.D.   On: 11/05/2019 20:52   US RENAL  Result Date: 11/15/2019 CLINICAL DATA:  Acute renal failure. Chronic kidney disease stage 4. EXAM: RENAL / URINARY TRACT ULTRASOUND COMPLETE COMPARISON:  None. FINDINGS: Right Kidney: Renal measurements: 12.4 x 5.2 x 5.1 cm = volume: 172 mL. Increased echogenicity of renal parenchyma is noted suggesting medical renal disease. No mass or hydronephrosis visualized. Left Kidney: Renal measurements: 10.5 x 5.1 x 4.8 cm = volume: 133 mL. Increased echogenicity of renal parenchyma is noted consistent with medical renal disease. No mass or hydronephrosis visualized. Bladder: Appears normal for degree of bladder distention. Other: None. IMPRESSION: Increased echogenicity of renal parenchyma is noted bilaterally suggesting medical renal disease. No hydronephrosis or renal obstruction is noted. Electronically Signed   By: Marijo Conception M.D.   On: 11/15/2019 16:37   DG Chest Portable 1 View  Result Date: 10/27/2019 CLINICAL DATA:  Worsening shortness of breath. EXAM: PORTABLE CHEST 1 VIEW COMPARISON:  04/26/2019 FINDINGS: The patient is rotated to the right with similar appearance of cardiomegaly. A pacemaker remains in place. There is a persistent small to moderate-sized right pleural effusion. Opacity in the right mid lung and right lower lung is at least partially chronic based on a 2019 study. The left lung is clear. No pneumothorax is identified. No acute osseous abnormality is seen. IMPRESSION: 1. Cardiomegaly with a persistent small to moderate-sized right pleural effusion. 2. Right mid and lower lung opacities favored  to reflect scarring and atelectasis although superimposed infection is not excluded. Electronically Signed   By: Logan Bores M.D.   On: 11/01/2019 11:43   ECHOCARDIOGRAM COMPLETE  Result Date: 11/15/2019    ECHOCARDIOGRAM REPORT   Patient Name:   Kyle Newton Date of Exam: 11/15/2019 Medical Rec #:  [id number]     Height:       69.0 in Accession #:    [id number]    Weight:       211.0 lb Date of Birth:  01/22/1956  BSA:          2.114 m Patient Age:    64 years      BP:           143/88 mmHg Patient Gender: M             HR:           64 bpm. Exam Location:  Inpatient Procedure: 2D Echo Indications:    CHF- Acute Systolic I43.32  History:        Patient has prior history of Echocardiogram examinations, most                 recent 04/27/2019. Risk Factors:Hypertension and Diabetes.                 Aortic Valve: bioprosthetic valve is present in the aortic                 position.  Sonographer:    Mikki Santee RDCS (AE) Referring Phys: [id number] [place]  1. Left ventricular ejection fraction, by estimation, is 35 to 40%. The left ventricle has moderately decreased function. The left ventricle has no regional wall motion abnormalities. Left ventricular diastolic function could not be evaluated. There is akinesis of the left ventricular, entire apical segment. There is akinesis of the left ventricular, apical lateral wall, inferolateral wall, septal wall, inferior wall and anterior wall. There is akinesis of the left ventricular, mid anteroseptal wall.  2. Right ventricular systolic function is mildly reduced. The right ventricular size is normal. There is moderately elevated pulmonary artery systolic pressure. The estimated right ventricular systolic pressure is 66.0 mmHg.  3. Left atrial size was severely dilated.  4. Right atrial size was moderately dilated.  5. The mitral valve is normal in structure. Trivial mitral valve regurgitation. No evidence of mitral stenosis. Severe mitral  annular calcification.  6. The aortic valve has been repaired/replaced. Aortic valve regurgitation is not visualized. No aortic stenosis is present. There is a bioprosthetic valve present in the aortic position. Echo findings are consistent with normal structure and function of the aortic valve prosthesis.  7. The inferior vena cava is dilated in size with <50% respiratory variability, suggesting right atrial pressure of 15 mmHg.  8. Recommend limited study with definity to more accurately define wall motion FINDINGS  Left Ventricle: Left ventricular ejection fraction, by estimation, is 35 to 40%. The left ventricle has moderately decreased function. The left ventricle has no regional wall motion abnormalities. The left ventricular internal cavity size was normal in size. There is no left ventricular hypertrophy. Abnormal (paradoxical) septal motion, consistent with RV pacemaker. Left ventricular diastolic function could not be evaluated due to paced rhythm. Left ventricular diastolic function could not be evaluated. Right Ventricle: The right ventricular size is normal. No increase in right ventricular wall thickness. Right ventricular systolic function is mildly reduced. There is moderately elevated pulmonary artery systolic pressure. The tricuspid regurgitant velocity is 2.85 m/s, and with an assumed right atrial pressure of 15 mmHg, the estimated right ventricular systolic pressure is 63.0 mmHg. Left Atrium: Left atrial size was severely dilated. Right Atrium: Right atrial size was moderately dilated. Pericardium: There is no evidence of pericardial effusion. Mitral Valve: The mitral valve is normal in structure. Severe mitral annular calcification. Trivial mitral valve regurgitation. No evidence of mitral valve stenosis. MV peak gradient, 12.6 mmHg. The mean mitral valve gradient is 3.0 mmHg. Tricuspid Valve: The tricuspid valve is normal in structure. Tricuspid  valve regurgitation is mild . No evidence of  tricuspid stenosis. Aortic Valve: The aortic valve has been repaired/replaced. Aortic valve regurgitation is not visualized. No aortic stenosis is present. Aortic valve mean gradient measures 14.2 mmHg. Aortic valve peak gradient measures 24.1 mmHg. There is a bioprosthetic  valve present in the aortic position. Echo findings are consistent with normal structure and function of the aortic valve prosthesis. Pulmonic Valve: The pulmonic valve was normal in structure. Pulmonic valve regurgitation is not visualized. No evidence of pulmonic stenosis. Aorta: The aortic root is normal in size and structure. Venous: The inferior vena cava is dilated in size with less than 50% respiratory variability, suggesting right atrial pressure of 15 mmHg. IAS/Shunts: No atrial level shunt detected by color flow Doppler. Additional Comments: A pacer wire is visualized.  LEFT VENTRICLE PLAX 2D LVIDd:         4.71 cm Diastology LVIDs:         3.85 cm LV e' medial:    5.03 cm/s LV PW:         1.10 cm LV E/e' medial:  24.3 LV IVS:        0.89 cm LV e' lateral:   6.38 cm/s                        LV E/e' lateral: 19.1  RIGHT VENTRICLE TAPSE (M-mode): 1.6 cm LEFT ATRIUM              Index       RIGHT ATRIUM           Index LA diam:        4.90 cm  2.32 cm/m  RA Area:     27.00 cm LA Vol (A2C):   86.0 ml  40.69 ml/m RA Volume:   93.60 ml  44.29 ml/m LA Vol (A4C):   138.0 ml 65.29 ml/m LA Biplane Vol: 111.0 ml 52.52 ml/m  AORTIC VALVE AV Vmax:           245.50 cm/s AV Vmean:          170.750 cm/s AV VTI:            0.532 m AV Peak Grad:      24.1 mmHg AV Mean Grad:      14.2 mmHg LVOT Vmax:         57.90 cm/s LVOT Vmean:        40.400 cm/s LVOT VTI:          0.126 m LVOT/AV VTI ratio: 0.24  AORTA Ao Root diam: 3.30 cm MITRAL VALVE                TRICUSPID VALVE MV Area (PHT): 2.91 cm     TR Peak grad:   32.5 mmHg MV Peak grad:  12.6 mmHg    TR Vmax:        285.00 cm/s MV Mean grad:  3.0 mmHg MV Vmax:       1.78 m/s     SHUNTS MV Vmean:       77.5 cm/s    Systemic VTI: 0.13 m MV Decel Time: 261 msec MV E velocity: 122.00 cm/s MV A velocity: 57.50 cm/s MV E/A ratio:  2.12 Fransico Him MD Electronically signed by Fransico Him MD Signature Date/Time: 11/15/2019/11:38:00 AM    Final     Cardiac Studies  Echo 11/15/19 1. Left ventricular ejection fraction, by estimation, is 35 to 40%. The  left ventricle has moderately decreased function. The left ventricle has  no regional wall motion abnormalities. Left ventricular diastolic function  could not be evaluated. There is  akinesis of the left ventricular, entire apical segment. There is akinesis  of the left ventricular, apical lateral wall, inferolateral wall, septal  wall, inferior wall and anterior wall. There is akinesis of the left  ventricular, mid anteroseptal wall.  2. Right ventricular systolic function is mildly reduced. The right  ventricular size is normal. There is moderately elevated pulmonary artery  systolic pressure. The estimated right ventricular systolic pressure is  53.2 mmHg.  3. Left atrial size was severely dilated.  4. Right atrial size was moderately dilated.  5. The mitral valve is normal in structure. Trivial mitral valve  regurgitation. No evidence of mitral stenosis. Severe mitral annular  calcification.  6. The aortic valve has been repaired/replaced. Aortic valve  regurgitation is not visualized. No aortic stenosis is present. There is a  bioprosthetic valve present in the aortic position. Echo findings are  consistent with normal structure and function  of the aortic valve prosthesis.  7. The inferior vena cava is dilated in size with <50% respiratory  variability, suggesting right atrial pressure of 15 mmHg.  8. Recommend limited study with definity to more accurately define wall  motion   Echocardiogram 04/27/2019: Impressions: 1. Left ventricular ejection fraction, by estimation, is 25 to 30%. The  left ventricle has severely  decreased function. The left ventricle  demonstrates global hypokinesis. There is mild left ventricular  hypertrophy. Septal, distal anteroseptal and  apical dyskinesis.  2. Right ventricular systolic function is mildly reduced. The right  ventricular size is moderately enlarged. An AICD wire is visualized. There  is moderately elevated pulmonary artery systolic pressure.  3. The mitral valve is abnormal. Trivial mitral valve regurgitation.  4. The tricuspid valve is abnormal. Tricuspid valve regurgitation is  moderate.  5. The aortic valve has been repaired/replaced. Aortic valve  regurgitation is not visualized. Moderate aortic valve stenosis. There is  a bioprosthetic valve present in the aortic position. Aortic valve mean  gradient measures 14.0 mmHg.  6. Left atrial size was moderately dilated.  7. Right atrial size was moderately dilated  TTE 2019: - Left ventricle: The cavity size was normal. Wall thickness was  increased in a pattern of mild LVH. Systolic function was mildly  reduced. The estimated ejection fraction was in the range of 45%  to 50%. There is hypokinesis of the mid-apicalanteroseptal  myocardium. The study is not technically sufficient to allow  evaluation of LV diastolic function.  - Ventricular septum: Septal motion showed abnormal function and  dyssynergy.  - Aortic valve: A bioprosthesis was present. Peak velocity (S): 323  cm/s. Mean gradient (S): 16 mm Hg. Valve area (VTI): 1.48 cm^2.  Valve area (Vmax): 1.23 cm^2. Valve area (Vmean): 1.35 cm^2.  - Mitral valve: Moderately thickened annulus. Structurally normal  valve.  - Left atrium: The atrium was moderately dilated.  - Right atrium: The atrium was moderately dilated.  - Pulmonary arteries: Systolic pressure was mildly increased.    Patient Profile     64 y.o. male with a PMH of chronic combined CHF s/p AICD, AVR in 2006 with mild AS on TTE in 04/2019, permanent atrial  fib/flutter s/p AVN abliation and DCPM, HTN and DMII who is being followed by cardiology for the evaluation of acute on chronic HF exacerbation  Assessment & Plan    Acute on chronic combined CHF:  -  prior EF 25-30%, most recent 35-40% yesterday -suspect 2/2 patient reported nonadherence -complicated by worsening renal function -diuresing with lasix drip, net negative 1 L in 24 hours -weight on 10/22 98.9 kg, no weight today -no ACEi/ARB/ARNI/MRA given renal disease -continue salt and fluid restriction, strict I/O, daily weights -see prior notes, not felt to be good candidate for cath/intervention -continue carvedilol, hydralazine/imdur -still grossly volume overloaded, but warm on exam  Permanent atrial fibrillation/flutter:  -ECG yesterday consistent with atypical atrial flutter -V paced, rate controlled -CHA2DS2-VASc Score = 6 [CHF History: 1, HTN History: 1, Diabetes History: 1, Stroke History: 2, Vascular Disease History: 1, Age Score: 0, Gender Score: 0].  Therefore, the patient's annual risk of stroke is 9.7 %.    - Continue carvedilol for rate control - Continue heparin gtt for stroke ppx  Acute kidney injury on chronic kidney disease stage 4 -appreciate nephrology involvement -now on lasix drip -has hyperkalemia, being managed with Lokelma. K 6.0 today -Cr 4.12 from 4.23 yesterday  HTN:  -well controlled today, had been poorly controlled previously -limited options given renal disease, see above -continue hydralazine/imdur and carvedilol  S/p medtronic ICD:  -currently V paced  History of aortic valve replacement:  - Continue routine outpatient monitoring  For questions or updates, please contact [facility] Please consult [email or web address] for contact info under Cardiology/STEMI.     Signed, Buford Dresser, MD  11/16/2019, 10:34 AM

## 2019-11-16 NOTE — Progress Notes (Addendum)
PROGRESS NOTE    Kyle Newton  OQH:[id number] DOB: Feb 07, 1955 DOA: 11/12/2019 PCP: Patient, No Pcp Per   Brief Narrative:63 y.o. male with medical history significant of CHF and DM2. Presenting with 2 weeks of scrotal/penis swelling. He denies any specific injury, discharge, or inability to urinate. He denies any specific treatment for it at home. He denies any aggravating or alleviating factors. He does note some increase in dyspnea, increase in leg swelling, and increase in fatigue during this time. Denies reports non-compliance with his home medications. He decided that he had suffered long enough and came to the ED to get checked out.   Patient with history of AICD and AVR in 2006 and AV nodal ablation for A. fib.  ED Course: Lab work showed increased Scr, BNP, and K+. Exam showed anasarca. [facility] was called for admission.    Assessment & Plan:   Active Problems:   Congestive heart failure (HCC)   AKI (acute kidney injury) ([place])   Anasarca    #1 Acute on chronic CHF Exacerbation-AICD-patient admitted with complaints of 2 weeks of scrotal swelling with associated dyspnea on exertion and lower extremity edema and fatigue. This is thought to be secondary to medication noncompliance.  Admission medications only shows Lipitor and Imdur. Negative by 5 1 7  cc Last echo from April 2021 shows ejection fraction 25 to 30%. BNP on admission was 489.6, troponin 43 Echo 11/15/2019 results ejection fraction 35 to 40%. On Coreg, Imdur, Lasix Chest x-ray 10/21- RIGHT PLEURAL EFFUSION CM No ACE or ARB due to AKI. I's and O's negative by 1.9 L  #2 type 2 dm is documented however he is not on any medicines at home for this.   CBG (last 3)  Recent Labs    11/15/19 1729 11/15/19 2156 11/16/19 0745  GLUCAP 132* 127* 104*     #3 AKI on ckd stage 3 b nephrology consulted will defer to nephrology regarding diuresis.  #4 hyperlipedemia on Lipitor  #5 hyperkalemia potassium 6.40n Lokelma.  Continue Lokelma follow-up labs. He received calcium gluconate glucose and insulin yesterday..  #6 isolated elevation of alkaline phosphatase other LFTs normal.  #7 history of essential hypertension continue current meds Coreg and Imdur.  #7 scrotal edema likely exacerbated by #1. Ultrasound shows marked severity scrotal edema with small amount of bilateral periepididymal fluid.  #8 history of polysubstance abuse check urine drug screen he has history of marijuana and cocaine abuse.  #9 history of aortic valve replacement needs outpatient follow-up.  #10 chronic atrial fibrillation/flutter- patient was on apixaban at home which is on hold due to AKI.  Currently on heparin drip.   Estimated body mass index is 32.2 kg/m as calculated from the following:   Height as of this encounter: 5\' 9"  (1.753 m).   Weight as of this encounter: 98.9 kg.  DVT prophylaxis: Heparin  Code Status: Full Family Communication: None at bedside Disposition Plan:  Status is: Inpatient   Dispo: The patient is from: Home              Anticipated d/c is to: Likely SNF              Anticipated d/c date is: > 3 days              Patient currently is not medically stable to d/c.    Consultants:   Nephrology and cardiology  Procedures: None Antimicrobials: None Subjective: Patient resting in bed he is awake partly snoring and talking he thinks he  had a bad night he denies having a CPAP at home he thinks his breathing is worse he denies any chest pain  Objective: Vitals:   11/15/19 1346 11/15/19 1800 11/15/19 2205 11/16/19 0500  BP: 107/78  106/67 129/84  Pulse: 61  64 68  Resp: 18  18   Temp: 98.1 F (36.7 C)   97.6 F (36.4 C)  TempSrc:    Oral  SpO2: 97%  100%   Weight:  98.9 kg    Height:        Intake/Output Summary (Last 24 hours) at 11/16/2019 1220 Last data filed at 11/16/2019 0950 Gross per 24 hour  Intake 699.69 ml  Output 1845 ml  Net -1145.31 ml   Filed Weights   11/12/2019  1525 11/15/19 1800  Weight: 95.7 kg 98.9 kg    Examination:  General exam: Appears calm and comfortable  Respiratory system: Diminished breath sounds at the bases right more than left to auscultation. Respiratory effort normal. Cardiovascular system: S1 & S2 heard, RRR. No JVD, murmurs, rubs, gallops or clicks. No pedal edema. Gastrointestinal system: Abdomen is nondistended, soft and nontender. No organomegaly or masses felt. Normal bowel sounds heard.  Scrotal edema Central nervous system: Alert and oriented. No focal neurological deficits. Extremities trace pitting edema bilaterally Skin: No rashes, lesions or ulcers Psychiatry: Judgement and insight appear normal. Mood & affect appropriate.     Data Reviewed: I have personally reviewed following labs and imaging studies  CBC: Recent Labs  Lab 11/18/2019 1031 11/15/19 0021 11/16/19 0418  WBC 4.4 4.0 4.4  NEUTROABS 2.7  --   --   HGB 13.7 12.0* 12.3*  HCT 43.8 38.8* 40.4  MCV 90.9 92.4 92.2  PLT 166 156 888   Basic Metabolic Panel: Recent Labs  Lab 11/02/2019 1031 11/15/19 0021 11/15/19 0610 11/15/19 1107 11/16/19 0418  NA 135 137 138 137 138  K 6.1* 6.5* 6.4* 5.7* 6.0*  CL 110 112* 110 113* 112*  CO2 17* 18* 17* 16* 14*  GLUCOSE 112* 150* 130* 83 108*  BUN 70* 70* 74* 77* 80*  CREATININE 3.92* 4.05* 4.23* 4.19* 4.12*  CALCIUM 9.1 8.8* 9.1 8.8* 8.9   GFR: Estimated Creatinine Clearance: 21 mL/min (A) (by C-G formula based on SCr of 4.12 mg/dL (H)). Liver Function Tests: Recent Labs  Lab 11/13/2019 1031 11/15/19 0021 11/15/19 0610 11/16/19 0418  AST 35 29 29 31   ALT 23 21 20 21   ALKPHOS 164* 140* 151* 154*  BILITOT 1.2 1.1 0.8 0.8  PROT 9.0* 7.7 7.9 7.8  ALBUMIN 3.3* 2.9* 3.0* 3.0*   No results for input(s): LIPASE, AMYLASE in the last 168 hours. No results for input(s): AMMONIA in the last 168 hours. Coagulation Profile: No results for input(s): INR, PROTIME in the last 168 hours. Cardiac Enzymes: No  results for input(s): CKTOTAL, CKMB, CKMBINDEX, TROPONINI in the last 168 hours. BNP (last 3 results) No results for input(s): PROBNP in the last 8760 hours. HbA1C: No results for input(s): HGBA1C in the last 72 hours. CBG: Recent Labs  Lab 11/15/19 1154 11/15/19 1402 11/15/19 1729 11/15/19 2156 11/16/19 0745  GLUCAP 90 70 132* 127* 104*   Lipid Profile: No results for input(s): CHOL, HDL, LDLCALC, TRIG, CHOLHDL, LDLDIRECT in the last 72 hours. Thyroid Function Tests: No results for input(s): TSH, T4TOTAL, FREET4, T3FREE, THYROIDAB in the last 72 hours. Anemia Panel: No results for input(s): VITAMINB12, FOLATE, FERRITIN, TIBC, IRON, RETICCTPCT in the last 72 hours. Sepsis Labs: No results for  input(s): PROCALCITON, LATICACIDVEN in the last 168 hours.  Recent Results (from the past 240 hour(s))  Respiratory Panel by RT PCR (Flu A&B, Covid) - Nasopharyngeal Swab     Status: None   Collection Time: 11/17/2019 10:33 AM   Specimen: Nasopharyngeal Swab  Result Value Ref Range Status   SARS Coronavirus 2 by RT PCR NEGATIVE NEGATIVE Final    Comment: (NOTE) SARS-CoV-2 target nucleic acids are NOT DETECTED.  The SARS-CoV-2 RNA is generally detectable in upper respiratoy specimens during the acute phase of infection. The lowest concentration of SARS-CoV-2 viral copies this assay can detect is 131 copies/mL. A negative result does not preclude SARS-Cov-2 infection and should not be used as the sole basis for treatment or other patient management decisions. A negative result may occur with  improper specimen collection/handling, submission of specimen other than nasopharyngeal swab, presence of viral mutation(s) within the areas targeted by this assay, and inadequate number of viral copies (<131 copies/mL). A negative result must be combined with clinical observations, patient history, and epidemiological information. The expected result is Negative.  Fact Sheet for Patients:    PinkCheek.be  Fact Sheet for Healthcare Providers:  GravelBags.it  This test is no t yet approved or cleared by the [place] FDA and  has been authorized for detection and/or diagnosis of SARS-CoV-2 by FDA under an Emergency Use Authorization (EUA). This EUA will remain  in effect (meaning this test can be used) for the duration of the COVID-19 declaration under Section 564(b)(1) of the Act, 21 U.S.C. section 360bbb-3(b)(1), unless the authorization is terminated or revoked sooner.     Influenza A by PCR NEGATIVE NEGATIVE Final   Influenza B by PCR NEGATIVE NEGATIVE Final    Comment: (NOTE) The Xpert Xpress SARS-CoV-2/FLU/RSV assay is intended as an aid in  the diagnosis of influenza from Nasopharyngeal swab specimens and  should not be used as a sole basis for treatment. Nasal washings and  aspirates are unacceptable for Xpert Xpress SARS-CoV-2/FLU/RSV  testing.  Fact Sheet for Patients: PinkCheek.be  Fact Sheet for Healthcare Providers: GravelBags.it  This test is not yet approved or cleared by the [place] FDA and  has been authorized for detection and/or diagnosis of SARS-CoV-2 by  FDA under an Emergency Use Authorization (EUA). This EUA will remain  in effect (meaning this test can be used) for the duration of the  Covid-19 declaration under Section 564(b)(1) of the Act, 21  U.S.C. section 360bbb-3(b)(1), unless the authorization is  terminated or revoked. Performed at [facility], [place] [address], [place], [place] [address]          Radiology Studies: US SCROTUM  Result Date: 11/04/2019 CLINICAL DATA:  Scrotal edema. EXAM: ULTRASOUND OF SCROTUM TECHNIQUE: Complete ultrasound examination of the testicles, epididymis, and other scrotal structures was performed. COMPARISON:  None. FINDINGS: Right testicle Measurements: 4.0  cm x 2.6 cm x 2.4 cm. A 6 mm x 4 mm x 4 mm calcification is seen adjacent to the superolateral aspect of the right testicle. Left testicle Measurements: 5.4 cm x 2.4 cm x 2.1 cm. No mass or microlithiasis visualized. Right epididymis:  Normal in size and appearance. Left epididymis:  Normal in size and appearance. Hydrocele: A small amount of fluid is seen adjacent to the bilateral epididymal heads. Varicocele:  None visualized. Other: Marked severity scrotal edema is seen. IMPRESSION: Marked severity scrotal edema with a small amount of bilateral peri epididymal fluid. Electronically Signed   By: Virgina Norfolk  M.D.   On: 10/30/2019 20:52   US RENAL  Result Date: 11/15/2019 CLINICAL DATA:  Acute renal failure. Chronic kidney disease stage 4. EXAM: RENAL / URINARY TRACT ULTRASOUND COMPLETE COMPARISON:  None. FINDINGS: Right Kidney: Renal measurements: 12.4 x 5.2 x 5.1 cm = volume: 172 mL. Increased echogenicity of renal parenchyma is noted suggesting medical renal disease. No mass or hydronephrosis visualized. Left Kidney: Renal measurements: 10.5 x 5.1 x 4.8 cm = volume: 133 mL. Increased echogenicity of renal parenchyma is noted consistent with medical renal disease. No mass or hydronephrosis visualized. Bladder: Appears normal for degree of bladder distention. Other: None. IMPRESSION: Increased echogenicity of renal parenchyma is noted bilaterally suggesting medical renal disease. No hydronephrosis or renal obstruction is noted. Electronically Signed   By: Marijo Conception M.D.   On: 11/15/2019 16:37   ECHOCARDIOGRAM COMPLETE  Result Date: 11/15/2019    ECHOCARDIOGRAM REPORT   Patient Name:   Kyle Newton Date of Exam: 11/15/2019 Medical Rec #:  [id number]     Height:       69.0 in Accession #:    [id number]    Weight:       211.0 lb Date of Birth:  1955/08/14    BSA:          2.114 m Patient Age:    48 years      BP:           143/88 mmHg Patient Gender: M             HR:           64 bpm. Exam  Location:  Inpatient Procedure: 2D Echo Indications:    CHF- Acute Systolic T15.72  History:        Patient has prior history of Echocardiogram examinations, most                 recent 04/27/2019. Risk Factors:Hypertension and Diabetes.                 Aortic Valve: bioprosthetic valve is present in the aortic                 position.  Sonographer:    Mikki Santee RDCS (AE) Referring Phys: [id number] [place]  1. Left ventricular ejection fraction, by estimation, is 35 to 40%. The left ventricle has moderately decreased function. The left ventricle has no regional wall motion abnormalities. Left ventricular diastolic function could not be evaluated. There is akinesis of the left ventricular, entire apical segment. There is akinesis of the left ventricular, apical lateral wall, inferolateral wall, septal wall, inferior wall and anterior wall. There is akinesis of the left ventricular, mid anteroseptal wall.  2. Right ventricular systolic function is mildly reduced. The right ventricular size is normal. There is moderately elevated pulmonary artery systolic pressure. The estimated right ventricular systolic pressure is 74.1 mmHg.  3. Left atrial size was severely dilated.  4. Right atrial size was moderately dilated.  5. The mitral valve is normal in structure. Trivial mitral valve regurgitation. No evidence of mitral stenosis. Severe mitral annular calcification.  6. The aortic valve has been repaired/replaced. Aortic valve regurgitation is not visualized. No aortic stenosis is present. There is a bioprosthetic valve present in the aortic position. Echo findings are consistent with normal structure and function of the aortic valve prosthesis.  7. The inferior vena cava is dilated in size with <50% respiratory variability, suggesting right atrial pressure of 15 mmHg.  8.  Recommend limited study with definity to more accurately define wall motion FINDINGS  Left Ventricle: Left ventricular ejection  fraction, by estimation, is 35 to 40%. The left ventricle has moderately decreased function. The left ventricle has no regional wall motion abnormalities. The left ventricular internal cavity size was normal in size. There is no left ventricular hypertrophy. Abnormal (paradoxical) septal motion, consistent with RV pacemaker. Left ventricular diastolic function could not be evaluated due to paced rhythm. Left ventricular diastolic function could not be evaluated. Right Ventricle: The right ventricular size is normal. No increase in right ventricular wall thickness. Right ventricular systolic function is mildly reduced. There is moderately elevated pulmonary artery systolic pressure. The tricuspid regurgitant velocity is 2.85 m/s, and with an assumed right atrial pressure of 15 mmHg, the estimated right ventricular systolic pressure is 25.6 mmHg. Left Atrium: Left atrial size was severely dilated. Right Atrium: Right atrial size was moderately dilated. Pericardium: There is no evidence of pericardial effusion. Mitral Valve: The mitral valve is normal in structure. Severe mitral annular calcification. Trivial mitral valve regurgitation. No evidence of mitral valve stenosis. MV peak gradient, 12.6 mmHg. The mean mitral valve gradient is 3.0 mmHg. Tricuspid Valve: The tricuspid valve is normal in structure. Tricuspid valve regurgitation is mild . No evidence of tricuspid stenosis. Aortic Valve: The aortic valve has been repaired/replaced. Aortic valve regurgitation is not visualized. No aortic stenosis is present. Aortic valve mean gradient measures 14.2 mmHg. Aortic valve peak gradient measures 24.1 mmHg. There is a bioprosthetic  valve present in the aortic position. Echo findings are consistent with normal structure and function of the aortic valve prosthesis. Pulmonic Valve: The pulmonic valve was normal in structure. Pulmonic valve regurgitation is not visualized. No evidence of pulmonic stenosis. Aorta: The aortic  root is normal in size and structure. Venous: The inferior vena cava is dilated in size with less than 50% respiratory variability, suggesting right atrial pressure of 15 mmHg. IAS/Shunts: No atrial level shunt detected by color flow Doppler. Additional Comments: A pacer wire is visualized.  LEFT VENTRICLE PLAX 2D LVIDd:         4.71 cm Diastology LVIDs:         3.85 cm LV e' medial:    5.03 cm/s LV PW:         1.10 cm LV E/e' medial:  24.3 LV IVS:        0.89 cm LV e' lateral:   6.38 cm/s                        LV E/e' lateral: 19.1  RIGHT VENTRICLE TAPSE (M-mode): 1.6 cm LEFT ATRIUM              Index       RIGHT ATRIUM           Index LA diam:        4.90 cm  2.32 cm/m  RA Area:     27.00 cm LA Vol (A2C):   86.0 ml  40.69 ml/m RA Volume:   93.60 ml  44.29 ml/m LA Vol (A4C):   138.0 ml 65.29 ml/m LA Biplane Vol: 111.0 ml 52.52 ml/m  AORTIC VALVE AV Vmax:           245.50 cm/s AV Vmean:          170.750 cm/s AV VTI:            0.532 m AV Peak Grad:  24.1 mmHg AV Mean Grad:      14.2 mmHg LVOT Vmax:         57.90 cm/s LVOT Vmean:        40.400 cm/s LVOT VTI:          0.126 m LVOT/AV VTI ratio: 0.24  AORTA Ao Root diam: 3.30 cm MITRAL VALVE                TRICUSPID VALVE MV Area (PHT): 2.91 cm     TR Peak grad:   32.5 mmHg MV Peak grad:  12.6 mmHg    TR Vmax:        285.00 cm/s MV Mean grad:  3.0 mmHg MV Vmax:       1.78 m/s     SHUNTS MV Vmean:      77.5 cm/s    Systemic VTI: 0.13 m MV Decel Time: 261 msec MV E velocity: 122.00 cm/s MV A velocity: 57.50 cm/s MV E/A ratio:  2.12 Fransico Him MD Electronically signed by Fransico Him MD Signature Date/Time: 11/15/2019/11:38:00 AM    Final         Scheduled Meds: . atorvastatin  10 mg Oral Daily  . carvedilol  3.125 mg Oral BID WC  . hydrALAZINE  25 mg Oral Q8H  . insulin aspart  0-15 Units Subcutaneous TID WC  . isosorbide mononitrate  30 mg Oral Daily  . sodium zirconium cyclosilicate  10 g Oral BID   Continuous Infusions: . sodium chloride  10 mL/hr at 11/15/19 0306  . furosemide 160 mg (11/16/19 0950)  . heparin 1,500 Units/hr (11/15/19 2326)     LOS: 2 days   Georgette Shell, MD  11/16/2019, 12:20 PM

## 2019-11-16 NOTE — Progress Notes (Addendum)
Unsuccessful foley placement x 2 by this RN. Pt missed 6pm lasix due to foley issue/voiding concerns. Pharmacy called to adjust times. Lasix given at 0000, next dose 8am. Patient able to void independently.   Post void bladder scan showed 145 ml urine.  Pt states "no insurance, no primary care, on ss and has not taken fluid pill for 2 months because dr would not give it to him".  Pt yells when he needs something. Call bell in reach. Pt instructed to use call bell when he needs assistance.

## 2019-11-16 NOTE — Progress Notes (Addendum)
[facility] Progress Note  Subjective: more alert, wants solid food, RN says there was mix-up w/ meal ordering  Vitals:   11/15/19 1346 11/15/19 1800 11/15/19 2205 11/16/19 0500  BP: 107/78  106/67 129/84  Pulse: 61  64 68  Resp: 18  18   Temp: 98.1 F (36.7 C)   97.6 F (36.4 C)  TempSrc:    Oral  SpO2: 97%  100%   Weight:  98.9 kg    Height:        Exam: Gen awake, more alert today and interacting +JVD Chest clear bilat to bases, no rales RRR no RG, soft SEM Abd soft ntnd no mass or ascites +bs GU 2+ penile/ scrotal edema Ext diffuse 3+ LE /  abd wall/ hip edema Neuro alert and moving all ext, gen weak    Home meds:  - lipitor 10 /imdur 30 qd   CXR - IMPRESSION: 1. Cardiomegaly with a persistent small to moderate-sized right pleural effusion. 2. Right mid and lower lung opacities favored to reflect scarring and atelectasis although superimposed infection is not excluded.    ECHO 10/22 - LVEF 35-40%, no RWMA's, akinetic apical segment and also of LV apical lat/ inf-lat/ septal / inf and anterior walls. RV mildly reduced, mod ^PA pressures. Severe LAE, mod RAE. AV replaced/ reparied.      EKG - vent paced rhythm. 80's.          Date                                 Creat               eGFR   May-June 2019         2.51 >> 1.57    AKI episode, eGFR 26 > 46   April 2021                  3.04 >> 2.30    AKI episode, eGFR 21 > 29   Nov 14, 2019             3.92                 16   Nov 15, 2019             4.19                 15     UA negative, prot 100    UNa / UCr pending    Renal US - 10.5/ 12.4 cm kidneys, ^'d echo, no hydro, normal appearing bladder  Assessment/ Plan: 1. AoCKD IV - baseline creat from 04/2019 = 2.3- 2.6, eGFR 25- 29 ml/min. Admit creat 3.9 here, stable at 4.1 today. Renal US no sx of obstruction. Severe vol overload w/ anasarca up to lower abd wall, hx known systolic CHF, low EF. Suspect HTN'sive renal failure w/ cardiorenal  syndrome as well. Couldn't place foley due to penile edema but pt is voiding well w/o it. Responding better to high-dose IV lasix. Will add zaroxolyn po. Cont to diurese. No indication for RRT yet.  2. HyperK+ - changed to renal/ carb mod diet, cont lokelma 3. A/C systolic CHF - LVEF 25-42%, on coreg 4. Perm afib/ flutter - believe on eliquis at home, IV hep here 5. HTN - dc hydralazine while diuresing, let BP's come up a bit 6. SP ICD 7. Hx AVR -  on IV hep     Kyle Newton 11/16/2019, 12:38 PM   Recent Labs  Lab 11/15/19 0021 11/15/19 0610 11/15/19 1107 11/16/19 0418  K 6.5*   < > 5.7* 6.0*  BUN 70*   < > 77* 80*  CREATININE 4.05*   < > 4.19* 4.12*  CALCIUM 8.8*   < > 8.8* 8.9  HGB 12.0*  --   --  12.3*   < > = values in this interval not displayed.   Inpatient medications: . atorvastatin  10 mg Oral Daily  . carvedilol  3.125 mg Oral BID WC  . dextrose  1 ampule Intravenous Once  . hydrALAZINE  25 mg Oral Q8H  . insulin aspart  0-15 Units Subcutaneous TID WC  . insulin aspart  10 Units Intravenous Once  . isosorbide mononitrate  30 mg Oral Daily  . sodium zirconium cyclosilicate  10 g Oral BID   . sodium chloride 10 mL/hr at 11/15/19 0306  . calcium gluconate    . furosemide 160 mg (11/16/19 0950)  . heparin 1,500 Units/hr (11/15/19 2326)   sodium chloride, acetaminophen **OR** acetaminophen, ondansetron **OR** ondansetron (ZOFRAN) IV

## 2019-11-16 NOTE — Progress Notes (Signed)
ANTICOAGULATION CONSULT NOTE  Pharmacy Consult for IV heparin Indication: atrial fibrillation  No Known Allergies  Patient Measurements: Height: 5\' 9"  (175.3 cm) Weight: 98.9 kg (218 lb 0.6 oz) IBW/kg (Calculated) : 70.7 Heparin Dosing Weight: 91 kg  Vital Signs: Temp: 97.6 F (36.4 C) (10/23 0500) Temp Source: Oral (10/23 0500) BP: 129/84 (10/23 0500) Pulse Rate: 68 (10/23 0500)  Labs: Recent Labs    11/18/2019 1031 11/02/2019 1031 11/15/19 0021 11/15/19 0021 11/15/19 0610 11/15/19 0610 11/15/19 1107 11/15/19 1110 11/15/19 2002 11/16/19 0418  HGB 13.7   < > 12.0*  --   --   --   --   --   --  12.3*  HCT 43.8  --  38.8*  --   --   --   --   --   --  40.4  PLT 166  --  156  --   --   --   --   --   --  164  APTT  --   --  77*  --   --   --   --   --   --   --   HEPARINUNFRC  --   --  0.10*   < > 0.40   < >  --  0.32 0.30 0.38  CREATININE 3.92*   < > 4.05*   < > 4.23*  --  4.19*  --   --  4.12*  TROPONINIHS 43*  --   --   --   --   --   --   --   --   --    < > = values in this interval not displayed.    Estimated Creatinine Clearance: 21 mL/min (A) (by C-G formula based on SCr of 4.12 mg/dL (H)).   Medical History: Past Medical History:  Diagnosis Date  . CHF (congestive heart failure) ([place])   . Diabetes mellitus without complication ([place])   . Hypertension     Medications:  Medications Prior to Admission  Medication Sig Dispense Refill Last Dose  . atorvastatin (LIPITOR) 10 MG tablet Take 1 tablet (10 mg total) by mouth daily. (Patient not taking: Reported on 10/27/2019) 30 tablet 11 Not Taking at Unknown time  . isosorbide mononitrate (IMDUR) 30 MG 24 hr tablet Take 1 tablet (30 mg total) by mouth daily. 90 tablet 0    Scheduled:  . atorvastatin  10 mg Oral Daily  . carvedilol  3.125 mg Oral BID WC  . hydrALAZINE  25 mg Oral Q8H  . insulin aspart  0-15 Units Subcutaneous TID WC  . isosorbide mononitrate  30 mg Oral Daily  . sodium zirconium cyclosilicate   10 g Oral BID   Infusions:  . sodium chloride 10 mL/hr at 11/15/19 0306  . furosemide 160 mg (11/16/19 0000)  . heparin 1,500 Units/hr (11/15/19 2326)   PRN:   Assessment: 61 yoM with PMH CHF, Afib previously on Eliquis, bioprosthetic AVR, HLD, DM2, admitted with scrotal/groin swelling; found to be in [facility] with AKI, possibly cardiorenal. Pharmacy to start heparin for Afib in setting of AKI.   Baseline INR, aPTT: n/a  Prior anticoagulation: Eliquis 2.5 mg bid; LD 10/18 - has been off most other meds for almost 2 months d/t being unable/unwilling to fill Rx. Appears Eliquis Rx was for 5 mg bid, but suspect patient may have been splitting tabs to prolong supply.   Significant events:  Today, 11/16/2019:  HL = 0.38 is therapeutic on heparin infusion  of 1500 units/hr  CBC: Hgb slightly low, Plt WNL  SCr 4.12, CrCl ~21 mL/min. AKI  No bleeding noted  Goal of Therapy: Heparin level 0.3-0.7 units/ml Monitor platelets by anticoagulation protocol: Yes  Plan:  Continue heparin infusion at current rate of 1500 units/hr  Daily CBC, daily heparin level  Monitor for signs of bleeding or thrombosis  Follow along for eventual transition to PO anticoagulation. Recommend case management consult to evaluate if any agents are cost prohibitive.   Dolly Rias RPh 11/16/2019, 6:23 AM

## 2019-11-16 NOTE — Consult Note (Signed)
Consultation Note Date: 11/16/2019   Patient Name: Kyle Newton  DOB: [birth year]  MRN: [id number]  Age / Sex: 64 y.o., male  PCP: Patient, No Pcp Per Referring Physician: Georgette Shell, MD  Reason for Consultation: Establishing goals of care  HPI/Patient Profile: 64 y.o. male  with past medical history of CHF, AVR in 2006, AV nodal a ablation for A. fib, AICD implantation and diabetes admitted on 11/11/2019 with increased ascites and scrotal/penis swelling.  Per report, he has not been taking medications as directed at home.  Clinical Assessment and Goals of Care: I met today with [name]. He was sitting in bed eating peanut butter and crackers and then began eating his meal when it was delivered partway through our conversation. His main focus today was on eating rather than fully engaging in our conversation.  We discussed his current clinical situation with heart failure, volume overload, and renal failure.  We reviewed how all of these interact with each other and that his fluid balance can be a challenging situation moving forward.  Discussed renal failure and possible pathways forward in light of this.  He would like to speak further with nephrology prior to making any other decisions regarding care plan.  Discussed advance care planning and begin discussion regarding both short and long-term goals of care. He expressed that he does not think he would want certain aggressive interventions, but he wants to consider this prior to making any changes to his current care plan.   We talked about who would be his decision maker in the event he was not able to make decisions for himself.  He lives alone.  He is not married.  He does not have any children.  His parents are not living.  He has 1 sister.  He understands that legally, she would be his decision maker unless he needs another surrogate via  completion of healthcare power of attorney paperwork.  He will consider who he thinks will be the best person to make decisions on behalf if he cannot make his own medical decisions.  I asked for contact information for his sister twice but he declined to give it to me at this time.  SUMMARY OF RECOMMENDATIONS   -Full code/full scope treatment -Discussed advance care planning and begin discussion regarding both short and long-term goals of care. He expressed that he does not think he would want certain aggressive interventions, but he wants to consider this prior to making any changes to his current care plan.  -We talked about who would be his decision maker in the event he was not able to make decisions for himself.  Legally, it would be his sister based upon my conversation with him today.  He did not provide me with her name or contact information.  He will consider if he would like to name somebody else is his surrogate decision maker.  Code Status/Advance Care Planning: Full code   Prognosis:  Guarded  Discharge Planning: To Be Determined  Primary Diagnoses: Present on Admission: . Anasarca   I have reviewed the medical record, interviewed the patient and family, and examined the patient. The following aspects are pertinent.  Past Medical History:  Diagnosis Date  . CHF (congestive heart failure) ([place])   . Diabetes mellitus without complication ([place])   . Hypertension    Social History   Socioeconomic History  . Marital status: Widowed    Spouse name: Not on file  . Number of children: Not on file  . Years of education: Not on file  . Highest education level: Not on file  Occupational History  . Not on file  Tobacco Use  . Smoking status: Current Every Day Smoker    Packs/day: 1.00    Years: 44.00    Pack years: 44.00    Types: Cigarettes  . Smokeless tobacco: Never Used  Vaping Use  . Vaping Use: Never used  Substance and Sexual Activity  . Alcohol use: Not  Currently  . Drug use: Not Currently    Types: Marijuana, Cocaine  . Sexual activity: Not on file  Other Topics Concern  . Not on file  Social History Narrative  . Not on file   Social Determinants of Health   Financial Resource Strain:   . Difficulty of Paying Living Expenses: Not on file  Food Insecurity:   . Worried About Charity fundraiser in the Last Year: Not on file  . Ran Out of Food in the Last Year: Not on file  Transportation Needs:   . Lack of Transportation (Medical): Not on file  . Lack of Transportation (Non-Medical): Not on file  Physical Activity:   . Days of Exercise per Week: Not on file  . Minutes of Exercise per Session: Not on file  Stress:   . Feeling of Stress : Not on file  Social Connections:   . Frequency of Communication with Friends and Family: Not on file  . Frequency of Social Gatherings with Friends and Family: Not on file  . Attends Religious Services: Not on file  . Active Member of Clubs or Organizations: Not on file  . Attends Archivist Meetings: Not on file  . Marital Status: Not on file   Family History  Problem Relation Age of Onset  . Heart failure Mother   . Diabetes Mother   . Diabetes Father    Scheduled Meds: . atorvastatin  10 mg Oral Daily  . carvedilol  3.125 mg Oral BID WC  . hydrALAZINE  25 mg Oral Q8H  . insulin aspart  0-15 Units Subcutaneous TID WC  . isosorbide mononitrate  30 mg Oral Daily  . sodium zirconium cyclosilicate  10 g Oral BID   Continuous Infusions: . sodium chloride 10 mL/hr at 11/15/19 0306  . furosemide 160 mg (11/16/19 0000)  . heparin 1,500 Units/hr (11/15/19 2326)   PRN Meds:.sodium chloride, acetaminophen **OR** acetaminophen, ondansetron **OR** ondansetron (ZOFRAN) IV Medications Prior to Admission:  Prior to Admission medications   Medication Sig Start Date End Date Taking? Authorizing Provider  atorvastatin (LIPITOR) 10 MG tablet Take 1 tablet (10 mg total) by mouth  daily. Patient not taking: Reported on 11/24/2019 05/01/19 04/30/20  [place] Eric J, DO  isosorbide mononitrate (IMDUR) 30 MG 24 hr tablet Take 1 tablet (30 mg total) by mouth daily. 05/02/19 07/31/19  [place] Eric J, DO   No Known Allergies Review of Systems  Constitutional: Positive for activity change, appetite change and fatigue.  Gastrointestinal: Positive for  abdominal distention and abdominal pain.  Genitourinary: Positive for penile swelling and scrotal swelling.  Psychiatric/Behavioral: Positive for sleep disturbance.   Physical Exam General: Alert, awake, in no acute distress.  HEENT: + JVD Heart: Regular rate and rhythm. No murmur appreciated. Lungs: Good air movement, clear Abdomen: Soft, nontender, distended, positive bowel sounds. + Scrotal edema Skin: Warm and dry Neuro: Grossly intact, nonfocal.  Vital Signs: BP 129/84 (BP Location: Left Arm)   Pulse 68   Temp 97.6 F (36.4 C) (Oral)   Resp 18   Ht 5' 9"  (1.753 m)   Wt 98.9 kg   SpO2 100%   BMI 32.20 kg/m  Pain Scale: 0-10   Pain Score: 0-No pain   SpO2: SpO2: 100 % O2 Device:SpO2: 100 % O2 Flow Rate: .   IO: Intake/output summary:   Intake/Output Summary (Last 24 hours) at 11/16/2019 1102 Last data filed at 11/16/2019 0500 Gross per 24 hour  Intake 699.69 ml  Output 1695 ml  Net -995.31 ml    LBM:   Baseline Weight: Weight: 95.7 kg Most recent weight: Weight: 98.9 kg     Palliative Assessment/Data:   Flowsheet Rows     Most Recent Value  Intake Tab  Referral Department Hospitalist  Unit at Time of Referral Med/Surg Unit  Palliative Care Primary Diagnosis Nephrology  Date Notified 11/15/19  Palliative Care Type New Palliative care  Reason for referral Clarify Goals of Care  Date of Admission 10/25/2019  Date first seen by Palliative Care 11/15/19  # of days Palliative referral response time 0 Day(s)  # of days IP prior to Palliative referral 1  Clinical Assessment  Palliative Performance  Scale Score 60%  Psychosocial & Spiritual Assessment  Palliative Care Outcomes  Patient/Family meeting held? Yes  Who was at the meeting? patient      Time In: 1300 Time Out: 1355 Time Total: 55 Greater than 50%  of this time was spent counseling and coordinating care related to the above assessment and plan.  Signed by: Micheline Rough, MD   Please contact Palliative Medicine Team phone at [phone] for questions and concerns.  For individual provider: See Shea Evans

## 2019-11-17 DIAGNOSIS — N5089 Other specified disorders of the male genital organs: Secondary | ICD-10-CM

## 2019-11-17 DIAGNOSIS — I484 Atypical atrial flutter: Secondary | ICD-10-CM | POA: Diagnosis not present

## 2019-11-17 DIAGNOSIS — I509 Heart failure, unspecified: Secondary | ICD-10-CM | POA: Diagnosis not present

## 2019-11-17 DIAGNOSIS — I5043 Acute on chronic combined systolic (congestive) and diastolic (congestive) heart failure: Secondary | ICD-10-CM | POA: Diagnosis not present

## 2019-11-17 DIAGNOSIS — Z7189 Other specified counseling: Secondary | ICD-10-CM | POA: Diagnosis not present

## 2019-11-17 DIAGNOSIS — R601 Generalized edema: Secondary | ICD-10-CM | POA: Diagnosis not present

## 2019-11-17 DIAGNOSIS — N179 Acute kidney failure, unspecified: Secondary | ICD-10-CM | POA: Diagnosis not present

## 2019-11-17 LAB — BASIC METABOLIC PANEL
Anion gap: 11 (ref 5–15)
BUN: 89 mg/dL — ABNORMAL HIGH (ref 8–23)
CO2: 17 mmol/L — ABNORMAL LOW (ref 22–32)
Calcium: 8.7 mg/dL — ABNORMAL LOW (ref 8.9–10.3)
Chloride: 109 mmol/L (ref 98–111)
Creatinine, Ser: 4.55 mg/dL — ABNORMAL HIGH (ref 0.61–1.24)
GFR, Estimated: 14 mL/min — ABNORMAL LOW (ref >60)
Glucose, Bld: 131 mg/dL — ABNORMAL HIGH (ref 70–99)
Potassium: 5.7 mmol/L — ABNORMAL HIGH (ref 3.5–5.1)
Sodium: 137 mmol/L (ref 135–145)

## 2019-11-17 LAB — HEPARIN LEVEL (UNFRACTIONATED)
Heparin Unfractionated: 0.11 IU/mL — ABNORMAL LOW (ref 0.30–0.70)
Heparin Unfractionated: 0.38 IU/mL (ref 0.30–0.70)
Heparin Unfractionated: 0.45 IU/mL (ref 0.30–0.70)

## 2019-11-17 LAB — CBC
HCT: 39.7 % (ref 39.0–52.0)
Hemoglobin: 12.7 g/dL — ABNORMAL LOW (ref 13.0–17.0)
MCH: 28.4 pg (ref 26.0–34.0)
MCHC: 32 g/dL (ref 30.0–36.0)
MCV: 88.8 fL (ref 80.0–100.0)
Platelets: 179 10*3/uL (ref 150–400)
RBC: 4.47 MIL/uL (ref 4.22–5.81)
RDW: 17.9 % — ABNORMAL HIGH (ref 11.5–15.5)
WBC: 3.9 10*3/uL — ABNORMAL LOW (ref 4.0–10.5)
nRBC: 0 % (ref 0.0–0.2)

## 2019-11-17 LAB — GLUCOSE, CAPILLARY
Glucose-Capillary: 110 mg/dL — ABNORMAL HIGH (ref 70–99)
Glucose-Capillary: 170 mg/dL — ABNORMAL HIGH (ref 70–99)
Glucose-Capillary: 75 mg/dL (ref 70–99)
Glucose-Capillary: 117 mg/dL — ABNORMAL HIGH (ref 70–99)

## 2019-11-17 LAB — RAPID URINE DRUG SCREEN, HOSP PERFORMED
Amphetamines: NOT DETECTED
Barbiturates: NOT DETECTED
Benzodiazepines: NOT DETECTED
Cocaine: POSITIVE — AB
Opiates: NOT DETECTED
Tetrahydrocannabinol: NOT DETECTED

## 2019-11-17 MED ORDER — LORATADINE 10 MG PO TABS: 10.0000 mg | ORAL_TABLET | Freq: Every day | ORAL | Status: DC

## 2019-11-17 MED ORDER — GUAIFENESIN-DM 100-10 MG/5ML PO SYRP: 5.0000 mL | ORAL_SOLUTION | ORAL | Status: DC | PRN

## 2019-11-17 MED ORDER — BISACODYL 5 MG PO TBEC
10.0000 mg | DELAYED_RELEASE_TABLET | Freq: Every day | ORAL | Status: DC
  Administered 2019-11-18 – 2019-11-21 (×4): 10 mg via ORAL
  Filled 2019-11-17 (×4): qty 2

## 2019-11-17 MED ORDER — FUROSEMIDE 10 MG/ML IJ SOLN
160.0000 mg | Freq: Two times a day (BID) | INTRAVENOUS | Status: DC
  Administered 2019-11-17: 160 mg via INTRAVENOUS
  Filled 2019-11-17: qty 10
  Filled 2019-11-17: qty 16

## 2019-11-17 NOTE — Progress Notes (Signed)
ANTICOAGULATION CONSULT NOTE  Pharmacy Consult for IV heparin Indication: atrial fibrillation  No Known Allergies  Patient Measurements: Height: 5\' 9"  (175.3 cm) Weight: 97.9 kg (215 lb 13.3 oz) IBW/kg (Calculated) : 70.7 Heparin Dosing Weight: 91 kg  Vital Signs: Temp: 97.5 F (36.4 C) (10/24 2059) BP: 125/79 (10/24 2059) Pulse Rate: 61 (10/24 2059)  Labs: Recent Labs    11/15/19 0021 11/15/19 0610 11/15/19 1107 11/15/19 1110 11/16/19 0418 11/16/19 0418 11/17/19 0414 11/17/19 1204 11/17/19 2029  HGB 12.0*  --   --   --  12.3*  --  12.7*  --   --   HCT 38.8*  --   --   --  40.4  --  39.7  --   --   PLT 156  --   --   --  164  --  179  --   --   APTT 77*  --   --   --   --   --   --   --   --   HEPARINUNFRC 0.10*   < >  --    < > 0.38   < > 0.11* 0.38 0.45  CREATININE 4.05*   < > 4.19*  --  4.12*  --  4.55*  --   --    < > = values in this interval not displayed.    Estimated Creatinine Clearance: 18.9 mL/min (A) (by C-G formula based on SCr of 4.55 mg/dL (H)).   Medical History: Past Medical History:  Diagnosis Date  . CHF (congestive heart failure) ([place])   . Diabetes mellitus without complication ([place])   . Hypertension     Medications: Apixaban 2.5 mg PO BID PTA  Assessment: 73 yoM with PMH CHF, Afib previously on Eliquis, bioprosthetic AVR, HLD, DM2, admitted with scrotal/groin swelling; found to be in [facility] with AKI, possibly cardiorenal. Pharmacy to start heparin for Afib in setting of AKI.   Baseline INR, aPTT: n/a  Prior anticoagulation: Eliquis 2.5 mg bid; LD 10/18 - has been off most other meds for almost 2 months d/t being unable/unwilling to fill Rx. Appears Eliquis Rx was for 5 mg bid, but suspect patient may have been splitting tabs to prolong supply.   Significant events:  Today, 11/17/2019:  1204 HL = 0.38 therapeutic on heparin infusion of 1700 units/hr  CBC: Hgb slightly low but stable, Plt WNL & stable  SCr 4.55, CrCl ~19 mL/min.  AKI  Confirmatory level at 2029=0.45 therapeutic  Goal of Therapy: Heparin level 0.3-0.7 units/ml Monitor platelets by anticoagulation protocol: Yes  Plan:  Continue heparin infusion at current rate of 1700 units/hr  Daily CBC, daily heparin level  Monitor for signs of bleeding or thrombosis  Follow along for eventual transition to PO anticoagulation. Recommend case management consult to evaluate if any agents are cost prohibitive.   Dolly Rias RPh 11/17/2019, 10:24 PM

## 2019-11-17 NOTE — Progress Notes (Signed)
PROGRESS NOTE    Kyle Newton  XAJ:[id number] DOB: [birth year] DOA: 10/25/2019 PCP: Patient, No Pcp Per   Brief Narrative:63 y.o. male with medical history significant of CHF and DM2. Presenting with 2 weeks of scrotal/penis swelling. He denies any specific injury, discharge, or inability to urinate. He denies any specific treatment for it at home. He denies any aggravating or alleviating factors. He does note some increase in dyspnea, increase in leg swelling, and increase in fatigue during this time. Denies reports non-compliance with his home medications. He decided that he had suffered long enough and came to the ED to get checked out.   Patient with history of AICD and AVR in 2006 and AV nodal ablation for A. fib.  ED Course: Lab work showed increased Scr, BNP, and K+. Exam showed anasarca. TRH was called for admission.    Assessment & Plan:   Active Problems:   Congestive heart failure (HCC)   AKI (acute kidney injury) ([place])   Anasarca    #1 Acute on chronic CHF Exacerbation-AICD-patient admitted with complaints of 2 weeks of scrotal swelling with associated dyspnea on exertion and lower extremity edema and fatigue. This is thought to be secondary to medication noncompliance.  Admission medications only shows Lipitor and Imdur. Negative by 3.5  L  Last echo from April 2021 shows ejection fraction 25 to 30%. BNP on admission was 489.6, troponin 43 Echo 11/15/2019 results ejection fraction 35 to 40%. On Coreg, Imdur, Lasix Chest x-ray 10/21- RIGHT PLEURAL EFFUSION CM No ACE or ARB due to AKI. hYDRALAZINE ON HOLD TO ALLOW room for diuresis  #2 type 2 dm is documented however he is not on any medicines at home for this.   CBG (last 3)  Recent Labs    11/16/19 1733 11/16/19 2034 11/17/19 0851  GLUCAP 126* 136* 110*   Check a1c  #3 AKI on ckd stage 3 b nephrology consulted will defer to nephrology regarding diuresis.  Creatinine trending up to 4.55.  #4 hyperlipedemia  on Lipitor  #5 hyperkalemia potassium 5.7 on Lokelma. Continue Lokelma follow-up labs. He received calcium gluconate glucose and insulin yesterday..  #6 isolated elevation of alkaline phosphatase other LFTs normal.  Follow levels.  #7 history of essential hypertension continue current meds Lasix Coreg and Imdur.  Blood pressure 114/70.  #7 scrotal edema likely exacerbated by #1. Ultrasound shows marked severity scrotal edema with small amount of bilateral periepididymal fluid.  Continue scrotal support.  #8 history of polysubstance abuse  he has history of marijuana and cocaine abuse.  Urine drug screen have been ordered and not done.  #9 history of aortic valve replacement needs outpatient follow-up.  #10 chronic atrial fibrillation/flutter- patient was on apixaban at home which is on hold due to AKI.  Currently on heparin drip.   Estimated body mass index is 31.87 kg/m as calculated from the following:   Height as of this encounter: 5\' 9"  (1.753 m).   Weight as of this encounter: 97.9 kg.  DVT prophylaxis: Heparin  Code Status: Full Family Communication: None at bedside Disposition Plan:  Status is: Inpatient   Dispo: The patient is from: Home              Anticipated d/c is to: Likely SNF              Anticipated d/c date is: > 3 days              Patient currently is not medically stable to d/c.  Consultants:   Nephrology and cardiology  Procedures: None Antimicrobials: None Subjective: He is more awake than yesterday.  He thinks his breathing is better.  Continues with a lot of scrotal edema. Has a lot of healed lesions on his skin which he reports he does not know what they are denies pain or itching. Objective: Vitals:   11/16/19 1321 11/16/19 1728 11/16/19 2036 11/17/19 0559  BP: 116/66 130/79 114/86 114/70  Pulse: 61 64 61 68  Resp: 18  20 18   Temp:   (!) 97.5 F (36.4 C) 97.6 F (36.4 C)  TempSrc:   Oral Axillary  SpO2: 98%  95% 100%  Weight:    97.9  kg  Height:        Intake/Output Summary (Last 24 hours) at 11/17/2019 1224 Last data filed at 11/17/2019 1050 Gross per 24 hour  Intake 1367.65 ml  Output 2725 ml  Net -1357.35 ml   Filed Weights   11/04/2019 1525 11/15/19 1800 11/17/19 0559  Weight: 95.7 kg 98.9 kg 97.9 kg    Examination:  General exam: Appears calm and comfortable  Respiratory system: Diminished breath sounds at the bases right more than left to auscultation. Respiratory effort normal. Cardiovascular system: S1 & S2 heard, RRR. No JVD, murmurs, rubs, gallops or clicks. No pedal edema. Gastrointestinal system: Abdomen is nondistended, soft and nontender. No organomegaly or masses felt. Normal bowel sounds heard.  Scrotal edema Central nervous system: Alert and oriented. No focal neurological deficits. Extremities trace pitting edema bilaterally, scrotal edema Skin: Multiple healed lesions on skin noted all over the body  psychiatry: Judgement and insight appear normal. Mood & affect appropriate.     Data Reviewed: I have personally reviewed following labs and imaging studies  CBC: Recent Labs  Lab 11/08/2019 1031 11/15/19 0021 11/16/19 0418 11/17/19 0414  WBC 4.4 4.0 4.4 3.9*  NEUTROABS 2.7  --   --   --   HGB 13.7 12.0* 12.3* 12.7*  HCT 43.8 38.8* 40.4 39.7  MCV 90.9 92.4 92.2 88.8  PLT 166 156 164 638   Basic Metabolic Panel: Recent Labs  Lab 11/15/19 0021 11/15/19 0610 11/15/19 1107 11/16/19 0418 11/17/19 0414  NA 137 138 137 138 137  K 6.5* 6.4* 5.7* 6.0* 5.7*  CL 112* 110 113* 112* 109  CO2 18* 17* 16* 14* 17*  GLUCOSE 150* 130* 83 108* 131*  BUN 70* 74* 77* 80* 89*  CREATININE 4.05* 4.23* 4.19* 4.12* 4.55*  CALCIUM 8.8* 9.1 8.8* 8.9 8.7*   GFR: Estimated Creatinine Clearance: 18.9 mL/min (A) (by C-G formula based on SCr of 4.55 mg/dL (H)). Liver Function Tests: Recent Labs  Lab 11/19/2019 1031 11/15/19 0021 11/15/19 0610 11/16/19 0418  AST 35 29 29 31   ALT 23 21 20 21     ALKPHOS 164* 140* 151* 154*  BILITOT 1.2 1.1 0.8 0.8  PROT 9.0* 7.7 7.9 7.8  ALBUMIN 3.3* 2.9* 3.0* 3.0*   No results for input(s): LIPASE, AMYLASE in the last 168 hours. No results for input(s): AMMONIA in the last 168 hours. Coagulation Profile: No results for input(s): INR, PROTIME in the last 168 hours. Cardiac Enzymes: No results for input(s): CKTOTAL, CKMB, CKMBINDEX, TROPONINI in the last 168 hours. BNP (last 3 results) No results for input(s): PROBNP in the last 8760 hours. HbA1C: No results for input(s): HGBA1C in the last 72 hours. CBG: Recent Labs  Lab 11/15/19 2156 11/16/19 0745 11/16/19 1733 11/16/19 2034 11/17/19 0851  GLUCAP 127* 104* 126* 136* 110*  Lipid Profile: No results for input(s): CHOL, HDL, LDLCALC, TRIG, CHOLHDL, LDLDIRECT in the last 72 hours. Thyroid Function Tests: No results for input(s): TSH, T4TOTAL, FREET4, T3FREE, THYROIDAB in the last 72 hours. Anemia Panel: No results for input(s): VITAMINB12, FOLATE, FERRITIN, TIBC, IRON, RETICCTPCT in the last 72 hours. Sepsis Labs: No results for input(s): PROCALCITON, LATICACIDVEN in the last 168 hours.  Recent Results (from the past 240 hour(s))  Respiratory Panel by RT PCR (Flu A&B, Covid) - Nasopharyngeal Swab     Status: None   Collection Time: 11/01/2019 10:33 AM   Specimen: Nasopharyngeal Swab  Result Value Ref Range Status   SARS Coronavirus 2 by RT PCR NEGATIVE NEGATIVE Final    Comment: (NOTE) SARS-CoV-2 target nucleic acids are NOT DETECTED.  The SARS-CoV-2 RNA is generally detectable in upper respiratoy specimens during the acute phase of infection. The lowest concentration of SARS-CoV-2 viral copies this assay can detect is 131 copies/mL. A negative result does not preclude SARS-Cov-2 infection and should not be used as the sole basis for treatment or other patient management decisions. A negative result may occur with  improper specimen collection/handling, submission of specimen  other than nasopharyngeal swab, presence of viral mutation(s) within the areas targeted by this assay, and inadequate number of viral copies (<131 copies/mL). A negative result must be combined with clinical observations, patient history, and epidemiological information. The expected result is Negative.  Fact Sheet for Patients:  PinkCheek.be  Fact Sheet for Healthcare Providers:  GravelBags.it  This test is no t yet approved or cleared by the [place] FDA and  has been authorized for detection and/or diagnosis of SARS-CoV-2 by FDA under an Emergency Use Authorization (EUA). This EUA will remain  in effect (meaning this test can be used) for the duration of the COVID-19 declaration under Section 564(b)(1) of the Act, 21 U.S.C. section 360bbb-3(b)(1), unless the authorization is terminated or revoked sooner.     Influenza A by PCR NEGATIVE NEGATIVE Final   Influenza B by PCR NEGATIVE NEGATIVE Final    Comment: (NOTE) The Xpert Xpress SARS-CoV-2/FLU/RSV assay is intended as an aid in  the diagnosis of influenza from Nasopharyngeal swab specimens and  should not be used as a sole basis for treatment. Nasal washings and  aspirates are unacceptable for Xpert Xpress SARS-CoV-2/FLU/RSV  testing.  Fact Sheet for Patients: PinkCheek.be  Fact Sheet for Healthcare Providers: GravelBags.it  This test is not yet approved or cleared by the [place] FDA and  has been authorized for detection and/or diagnosis of SARS-CoV-2 by  FDA under an Emergency Use Authorization (EUA). This EUA will remain  in effect (meaning this test can be used) for the duration of the  Covid-19 declaration under Section 564(b)(1) of the Act, 21  U.S.C. section 360bbb-3(b)(1), unless the authorization is  terminated or revoked. Performed at [facility], [place] [address] [place], [place] [address]          Radiology Studies: US RENAL  Result Date: 11/15/2019 CLINICAL DATA:  Acute renal failure. Chronic kidney disease stage 4. EXAM: RENAL / URINARY TRACT ULTRASOUND COMPLETE COMPARISON:  None. FINDINGS: Right Kidney: Renal measurements: 12.4 x 5.2 x 5.1 cm = volume: 172 mL. Increased echogenicity of renal parenchyma is noted suggesting medical renal disease. No mass or hydronephrosis visualized. Left Kidney: Renal measurements: 10.5 x 5.1 x 4.8 cm = volume: 133 mL. Increased echogenicity of renal parenchyma is noted consistent with medical renal disease. No mass or hydronephrosis visualized.  Bladder: Appears normal for degree of bladder distention. Other: None. IMPRESSION: Increased echogenicity of renal parenchyma is noted bilaterally suggesting medical renal disease. No hydronephrosis or renal obstruction is noted. Electronically Signed   By: Marijo Conception M.D.   On: 11/15/2019 16:37        Scheduled Meds: . atorvastatin  10 mg Oral Daily  . bisacodyl  10 mg Oral Daily  . carvedilol  3.125 mg Oral BID WC  . insulin aspart  0-15 Units Subcutaneous TID WC  . isosorbide mononitrate  30 mg Oral Daily  . metolazone  10 mg Oral Daily  . sodium zirconium cyclosilicate  10 g Oral BID   Continuous Infusions: . sodium chloride 10 mL/hr at 11/15/19 0306  . furosemide    . heparin 1,700 Units/hr (11/17/19 1043)     LOS: 3 days   Georgette Shell, MD  11/17/2019, 12:24 PM

## 2019-11-17 NOTE — Progress Notes (Signed)
Patient bed alarm going off. Patient found climbing over bed rails to go urinate in bathroom. Pt forgot he had a primo fit catheter on. Pt assisted in getting his legs back in bed.

## 2019-11-17 NOTE — Progress Notes (Signed)
ANTICOAGULATION CONSULT NOTE  Pharmacy Consult for IV heparin Indication: atrial fibrillation  No Known Allergies  Patient Measurements: Height: 5\' 9"  (175.3 cm) Weight: 97.9 kg (215 lb 13.3 oz) IBW/kg (Calculated) : 70.7 Heparin Dosing Weight: 91 kg  Vital Signs: Temp: 97.6 F (36.4 C) (10/24 0559) Temp Source: Axillary (10/24 0559) BP: 130/86 (10/24 1256) Pulse Rate: 63 (10/24 1256)  Labs: Recent Labs    11/15/19 0021 11/15/19 0610 11/15/19 1107 11/15/19 1110 11/16/19 0418 11/17/19 0414 11/17/19 1204  HGB 12.0*  --   --   --  12.3* 12.7*  --   HCT 38.8*  --   --   --  40.4 39.7  --   PLT 156  --   --   --  164 179  --   APTT 77*  --   --   --   --   --   --   HEPARINUNFRC 0.10*   < >  --    < > 0.38 0.11* 0.38  CREATININE 4.05*   < > 4.19*  --  4.12* 4.55*  --    < > = values in this interval not displayed.    Estimated Creatinine Clearance: 18.9 mL/min (A) (by C-G formula based on SCr of 4.55 mg/dL (H)).   Medical History: Past Medical History:  Diagnosis Date  . CHF (congestive heart failure) ([place])   . Diabetes mellitus without complication ([place])   . Hypertension     Medications: Apixaban 2.5 mg PO BID PTA  Assessment: 45 yoM with PMH CHF, Afib previously on Eliquis, bioprosthetic AVR, HLD, DM2, admitted with scrotal/groin swelling; found to be in [facility] with AKI, possibly cardiorenal. Pharmacy to start heparin for Afib in setting of AKI.   Baseline INR, aPTT: n/a  Prior anticoagulation: Eliquis 2.5 mg bid; LD 10/18 - has been off most other meds for almost 2 months d/t being unable/unwilling to fill Rx. Appears Eliquis Rx was for 5 mg bid, but suspect patient may have been splitting tabs to prolong supply.   Significant events:  Today, 11/17/2019:  HL = 0.38 therapeutic on heparin infusion of 1700 units/hr  CBC: Hgb slightly low but stable, Plt WNL & stable  SCr 4.55, CrCl ~19 mL/min. AKI  Confirmed with RN that heparin infusing at correct  rate. No signs of bleeding.  Goal of Therapy: Heparin level 0.3-0.7 units/ml Monitor platelets by anticoagulation protocol: Yes  Plan:  Continue heparin infusion at current rate of 1700 units/hr  Check confirmatory HL in 8 hours  Daily CBC, daily heparin level  Monitor for signs of bleeding or thrombosis  Follow along for eventual transition to PO anticoagulation. Recommend case management consult to evaluate if any agents are cost prohibitive.   Lenis Noon, PharmD 11/17/19 1:56 PM

## 2019-11-17 NOTE — Progress Notes (Deleted)
Kyle Newton Progress Note  Subjective: UOP good 2 L yest, creat up slightly to 4.6 today. BUN 93. Pt having coughing spells in the room, SpO2 are good however on RA.  CXR now shows slightly ^vascularity but no sig edema. Wts are up from admission.   Vitals:   11/16/19 1321 11/16/19 1728 11/16/19 2036 11/17/19 0559  BP: 116/66 130/79 114/86 114/70  Pulse: 61 64 61 68  Resp: 18  20 18   Temp:   (!) 97.5 F (36.4 C) 97.6 F (36.4 C)  TempSrc:   Oral Axillary  SpO2: 98%  95% 100%  Weight:    97.9 kg  Height:        Exam: Gen awake, interactive +JVD Chest clear bilat to bases, no rales RRR no RG, soft SEM Abd soft ntnd no mass or ascites +bs GU 2+ penile/ scrotal edema Ext diffuse LE /  abd wall/ thoracic wall pitting edema Neuro alert and moving all ext, gen weak    Home meds:  - lipitor 10 /imdur 30 qd   CXR - IMPRESSION: 1. Cardiomegaly with a persistent small to moderate-sized right pleural effusion. 2. Right mid and lower lung opacities favored to reflect scarring and atelectasis although superimposed infection is not excluded.    ECHO 10/22 - LVEF 35-40%, no RWMA's, akinetic apical segment and also of LV apical lat/ inf-lat/ septal / inf and anterior walls. RV mildly reduced, mod ^PA pressures. Severe LAE, mod RAE. AV replaced/ reparied.      EKG - vent paced rhythm. 80's.          Date                                 Creat               eGFR   May-June 2019         2.51 >> 1.57    AKI episode, eGFR 26 > 46   April 2021                  3.04 >> 2.30    AKI episode, eGFR 21 > 29   Nov 14, 2019             3.92                 16   Nov 15, 2019             4.19                 15     UA negative, prot 100    UNa / UCr pending    Renal US - 10.5/ 12.4 cm kidneys, ^'d echo, no hydro, normal appearing bladder  Assessment/ Plan: 1. AoCKD IV - baseline creat from 04/2019 = 2.3- 2.6, eGFR 25- 29 ml/min. Admit creat 3.9 here, stable at 4.1 today. Renal US no  sx of obstruction. Severe vol overload w/ anasarca up to lower abd wall, hx known systolic CHF, low EF. Suspect HTN'sive renal failure w/ cardiorenal syndrome as well. Pt diuresing somewhat however weights are up and anasarca no better, creat rising. Will need dialysis to get control of volume status. Plan tx to [facility], consult IR for temp HD cath and plan HD later today at [facility]. Have d/w pt and questions answered.  2. HyperK+ - changed to renal/ carb mod diet, cont lokelma, HD 3.  A/C systolic CHF - LVEF 47-30%, on coreg 4. Perm afib/ flutter - on eliquis at home, IV hep here 5. HTN - dc'd hydralazine while diuresing 6. SP ICD 7. Hx AVR - on IV hep     Rob Blanton Kardell 11/17/2019, 8:45 AM   Recent Labs  Lab 11/16/19 0418 11/17/19 0414  K 6.0* 5.7*  BUN 80* 89*  CREATININE 4.12* 4.55*  CALCIUM 8.9 8.7*  HGB 12.3* 12.7*   Inpatient medications: . atorvastatin  10 mg Oral Daily  . carvedilol  3.125 mg Oral BID WC  . insulin aspart  0-15 Units Subcutaneous TID WC  . isosorbide mononitrate  30 mg Oral Daily  . metolazone  10 mg Oral Daily  . sodium zirconium cyclosilicate  10 g Oral BID   . sodium chloride 10 mL/hr at 11/15/19 0306  . furosemide 160 mg (11/17/19 0845)  . heparin 1,700 Units/hr (11/17/19 0630)   sodium chloride, acetaminophen **OR** acetaminophen, ondansetron **OR** ondansetron (ZOFRAN) IV

## 2019-11-17 NOTE — Progress Notes (Signed)
Progress Note  Patient Name: Kyle Newton Date of Encounter: 11/17/2019  Primary Cardiologist: Pixie Casino, MD   Subjective  Sitting reclined in chair this AM, comfortable. Feels like fluid is coming off well. No chest pain, breathing is stable.  Inpatient Medications    Scheduled Meds: . atorvastatin  10 mg Oral Daily  . bisacodyl  10 mg Oral Daily  . carvedilol  3.125 mg Oral BID WC  . insulin aspart  0-15 Units Subcutaneous TID WC  . isosorbide mononitrate  30 mg Oral Daily  . metolazone  10 mg Oral Daily  . sodium zirconium cyclosilicate  10 g Oral BID   Continuous Infusions: . sodium chloride 10 mL/hr at 11/15/19 0306  . furosemide    . heparin 1,700 Units/hr (11/17/19 1043)   PRN Meds: sodium chloride, acetaminophen **OR** acetaminophen, ondansetron **OR** ondansetron (ZOFRAN) IV   Vital Signs    Vitals:   11/16/19 1321 11/16/19 1728 11/16/19 2036 11/17/19 0559  BP: 116/66 130/79 114/86 114/70  Pulse: 61 64 61 68  Resp: 18  20 18   Temp:   (!) 97.5 F (36.4 C) 97.6 F (36.4 C)  TempSrc:   Oral Axillary  SpO2: 98%  95% 100%  Weight:    97.9 kg  Height:        Intake/Output Summary (Last 24 hours) at 11/17/2019 1113 Last data filed at 11/17/2019 1050 Gross per 24 hour  Intake 1367.65 ml  Output 2725 ml  Net -1357.35 ml   Filed Weights   11/13/2019 1525 11/15/19 1800 11/17/19 0559  Weight: 95.7 kg 98.9 kg 97.9 kg    Telemetry    Atypical atrial flutter with V pacing - Personally Reviewed  ECG    11/15/19 V paced, with what appears to be atypical atrial flutter as underlying rhythm- Personally Reviewed  Physical Exam   GEN: Well nourished, well developed in no acute distress NECK: JVD just above clavicle at 45 dcegrees CARDIAC: regular rhythm, normal S1 and S2, no rubs or gallops. No murmur appreciated. VASCULAR: Radial pulses 2+ bilaterally.  RESPIRATORY:  Clear to auscultation in upper fields, scattered rhonchi, mild rales at  bases ABDOMEN: Soft, non-tender, non-distended MUSCULOSKELETAL:  Moves all 4 limbs independently SKIN: Warm and dry, 2+ pitting edema from feet to abdomen but significant skin wrinkling NEUROLOGIC:  No focal neuro deficits noted. PSYCHIATRIC:  Normal affect   Labs    Chemistry Recent Labs  Lab 11/15/19 0021 11/15/19 0021 11/15/19 3532 11/15/19 0610 11/15/19 1107 11/16/19 0418 11/17/19 0414  NA 137   < > 138   < > 137 138 137  K 6.5*   < > 6.4*   < > 5.7* 6.0* 5.7*  CL 112*   < > 110   < > 113* 112* 109  CO2 18*   < > 17*   < > 16* 14* 17*  GLUCOSE 150*   < > 130*   < > 83 108* 131*  BUN 70*   < > 74*   < > 77* 80* 89*  CREATININE 4.05*   < > 4.23*   < > 4.19* 4.12* 4.55*  CALCIUM 8.8*   < > 9.1   < > 8.8* 8.9 8.7*  PROT 7.7  --  7.9  --   --  7.8  --   ALBUMIN 2.9*  --  3.0*  --   --  3.0*  --   AST 29  --  29  --   --  31  --   ALT 21  --  20  --   --  21  --   ALKPHOS 140*  --  151*  --   --  154*  --   BILITOT 1.1  --  0.8  --   --  0.8  --   GFRNONAA 16*   < > 15*   < > 15* 15* 14*  ANIONGAP 7   < > 11   < > 8 12 11    < > = values in this interval not displayed.     Hematology Recent Labs  Lab 11/15/19 0021 11/16/19 0418 11/17/19 0414  WBC 4.0 4.4 3.9*  RBC 4.20* 4.38 4.47  HGB 12.0* 12.3* 12.7*  HCT 38.8* 40.4 39.7  MCV 92.4 92.2 88.8  MCH 28.6 28.1 28.4  MCHC 30.9 30.4 32.0  RDW 18.2* 17.9* 17.9*  PLT 156 164 179    Cardiac EnzymesNo results for input(s): TROPONINI in the last 168 hours. No results for input(s): TROPIPOC in the last 168 hours.   BNP Recent Labs  Lab 11/12/2019 1031  BNP 489.6*     DDimer No results for input(s): DDIMER in the last 168 hours.   Radiology    US RENAL  Result Date: 11/15/2019 CLINICAL DATA:  Acute renal failure. Chronic kidney disease stage 4. EXAM: RENAL / URINARY TRACT ULTRASOUND COMPLETE COMPARISON:  None. FINDINGS: Right Kidney: Renal measurements: 12.4 x 5.2 x 5.1 cm = volume: 172 mL. Increased echogenicity  of renal parenchyma is noted suggesting medical renal disease. No mass or hydronephrosis visualized. Left Kidney: Renal measurements: 10.5 x 5.1 x 4.8 cm = volume: 133 mL. Increased echogenicity of renal parenchyma is noted consistent with medical renal disease. No mass or hydronephrosis visualized. Bladder: Appears normal for degree of bladder distention. Other: None. IMPRESSION: Increased echogenicity of renal parenchyma is noted bilaterally suggesting medical renal disease. No hydronephrosis or renal obstruction is noted. Electronically Signed   By: Marijo Conception M.D.   On: 11/15/2019 16:37    Cardiac Studies  Echo 11/15/19 1. Left ventricular ejection fraction, by estimation, is 35 to 40%. The  left ventricle has moderately decreased function. The left ventricle has  no regional wall motion abnormalities. Left ventricular diastolic function  could not be evaluated. There is  akinesis of the left ventricular, entire apical segment. There is akinesis  of the left ventricular, apical lateral wall, inferolateral wall, septal  wall, inferior wall and anterior wall. There is akinesis of the left  ventricular, mid anteroseptal wall.  2. Right ventricular systolic function is mildly reduced. The right  ventricular size is normal. There is moderately elevated pulmonary artery  systolic pressure. The estimated right ventricular systolic pressure is  31.5 mmHg.  3. Left atrial size was severely dilated.  4. Right atrial size was moderately dilated.  5. The mitral valve is normal in structure. Trivial mitral valve  regurgitation. No evidence of mitral stenosis. Severe mitral annular  calcification.  6. The aortic valve has been repaired/replaced. Aortic valve  regurgitation is not visualized. No aortic stenosis is present. There is a  bioprosthetic valve present in the aortic position. Echo findings are  consistent with normal structure and function  of the aortic valve prosthesis.  7. The  inferior vena cava is dilated in size with <50% respiratory  variability, suggesting right atrial pressure of 15 mmHg.  8. Recommend limited study with definity to more accurately define wall  motion   Echocardiogram 04/27/2019:  Impressions: 1. Left ventricular ejection fraction, by estimation, is 25 to 30%. The  left ventricle has severely decreased function. The left ventricle  demonstrates global hypokinesis. There is mild left ventricular  hypertrophy. Septal, distal anteroseptal and  apical dyskinesis.  2. Right ventricular systolic function is mildly reduced. The right  ventricular size is moderately enlarged. An AICD wire is visualized. There  is moderately elevated pulmonary artery systolic pressure.  3. The mitral valve is abnormal. Trivial mitral valve regurgitation.  4. The tricuspid valve is abnormal. Tricuspid valve regurgitation is  moderate.  5. The aortic valve has been repaired/replaced. Aortic valve  regurgitation is not visualized. Moderate aortic valve stenosis. There is  a bioprosthetic valve present in the aortic position. Aortic valve mean  gradient measures 14.0 mmHg.  6. Left atrial size was moderately dilated.  7. Right atrial size was moderately dilated  TTE 2019: - Left ventricle: The cavity size was normal. Wall thickness was  increased in a pattern of mild LVH. Systolic function was mildly  reduced. The estimated ejection fraction was in the range of 45%  to 50%. There is hypokinesis of the mid-apicalanteroseptal  myocardium. The study is not technically sufficient to allow  evaluation of LV diastolic function.  - Ventricular septum: Septal motion showed abnormal function and  dyssynergy.  - Aortic valve: A bioprosthesis was present. Peak velocity (S): 323  cm/s. Mean gradient (S): 16 mm Hg. Valve area (VTI): 1.48 cm^2.  Valve area (Vmax): 1.23 cm^2. Valve area (Vmean): 1.35 cm^2.  - Mitral valve: Moderately thickened annulus.  Structurally normal  valve.  - Left atrium: The atrium was moderately dilated.  - Right atrium: The atrium was moderately dilated.  - Pulmonary arteries: Systolic pressure was mildly increased.    Patient Profile     64 y.o. male with a PMH of chronic combined CHF s/p AICD, AVR in 2006 with mild AS on TTE in 04/2019, permanent atrial fib/flutter s/p AVN abliation and DCPM, HTN and DMII who is being followed by cardiology for the evaluation of acute on chronic HF exacerbation  Assessment & Plan    Acute on chronic combined CHF:  -prior EF 25-30%, most recent 35-40% this admission -suspect 2/2 patient reported nonadherence -complicated by worsening renal function -diuresing with lasix drip, charted as net negative 3.5 L but suspect this is inaccurate given weights -weight on 10/22 98.9 kg, today 97.9 kg -no ACEi/ARB/ARNI/MRA given renal disease -continue salt and fluid restriction, strict I/O, daily weights -see prior notes, not felt to be good candidate for cath/intervention -continue carvedilol, imdur. Hydralazine on hold to allow for more blood pressure room with diuresis -still grossly volume overloaded, but wrinkling in legs is positive sign. Warm in extremities  Permanent atrial fibrillation/flutter:  -ECG 10/22 consistent with atypical atrial flutter -V paced, rate controlled -CHA2DS2-VASc Score = 6 [CHF History: 1, HTN History: 1, Diabetes History: 1, Stroke History: 2, Vascular Disease History: 1, Age Score: 0, Gender Score: 0].  Therefore, the patient's annual risk of stroke is 9.7 %.    - Continue carvedilol for rate control - Continue heparin gtt for stroke ppx  Acute kidney injury on chronic kidney disease stage 4 -appreciate nephrology involvement -now on lasix drip and metolazone, diuretic plan per nephrology -Cr 4.55, K 5.7 today  HTN:  -well controlled today, had been poorly controlled previously -limited options given renal disease, see above -continue imdur  and carvedilol -hydralazine decreased per nephrology to allow for more blood pressure while diuresing  S/p medtronic ICD:  -currently V paced  History of aortic valve replacement:  - Continue routine outpatient monitoring  For questions or updates, please contact [facility] Please consult [email or web address] for contact info under Cardiology/STEMI.     Signed, Buford Dresser, MD  11/17/2019, 11:13 AM

## 2019-11-17 NOTE — Progress Notes (Signed)
ANTICOAGULATION CONSULT NOTE  Pharmacy Consult for IV heparin Indication: atrial fibrillation  No Known Allergies  Patient Measurements: Height: 5\' 9"  (175.3 cm) Weight: 97.9 kg (215 lb 13.3 oz) IBW/kg (Calculated) : 70.7 Heparin Dosing Weight: 91 kg  Vital Signs: Temp: 97.6 F (36.4 C) (10/24 0559) Temp Source: Axillary (10/24 0559) BP: 114/70 (10/24 0559) Pulse Rate: 68 (10/24 0559)  Labs: Recent Labs    11/10/2019 1031 11/07/2019 1031 11/15/19 0021 11/15/19 0021 11/15/19 0610 11/15/19 1107 11/15/19 1110 11/15/19 2002 11/16/19 0418 11/17/19 0414  HGB 13.7   < > 12.0*   < >  --   --   --   --  12.3* 12.7*  HCT 43.8   < > 38.8*  --   --   --   --   --  40.4 39.7  PLT 166   < > 156  --   --   --   --   --  164 179  APTT  --   --  77*  --   --   --   --   --   --   --   HEPARINUNFRC  --   --  0.10*  --    < >  --    < > 0.30 0.38 0.11*  CREATININE 3.92*   < > 4.05*  --    < > 4.19*  --   --  4.12* 4.55*  TROPONINIHS 43*  --   --   --   --   --   --   --   --   --    < > = values in this interval not displayed.    Estimated Creatinine Clearance: 18.9 mL/min (A) (by C-G formula based on SCr of 4.55 mg/dL (H)).   Medical History: Past Medical History:  Diagnosis Date  . CHF (congestive heart failure) ([place])   . Diabetes mellitus without complication ([place])   . Hypertension     Medications:  Medications Prior to Admission  Medication Sig Dispense Refill Last Dose  . atorvastatin (LIPITOR) 10 MG tablet Take 1 tablet (10 mg total) by mouth daily. (Patient not taking: Reported on 10/31/2019) 30 tablet 11 Not Taking at Unknown time  . isosorbide mononitrate (IMDUR) 30 MG 24 hr tablet Take 1 tablet (30 mg total) by mouth daily. 90 tablet 0    Scheduled:  . atorvastatin  10 mg Oral Daily  . carvedilol  3.125 mg Oral BID WC  . insulin aspart  0-15 Units Subcutaneous TID WC  . isosorbide mononitrate  30 mg Oral Daily  . metolazone  10 mg Oral Daily  . sodium zirconium  cyclosilicate  10 g Oral BID   Infusions:  . sodium chloride 10 mL/hr at 11/15/19 0306  . furosemide 160 mg (11/17/19 0007)  . heparin 1,500 Units/hr (11/16/19 1626)   PRN:   Assessment: 14 yoM with PMH CHF, Afib previously on Eliquis, bioprosthetic AVR, HLD, DM2, admitted with scrotal/groin swelling; found to be in [facility] with AKI, possibly cardiorenal. Pharmacy to start heparin for Afib in setting of AKI.   Baseline INR, aPTT: n/a  Prior anticoagulation: Eliquis 2.5 mg bid; LD 10/18 - has been off most other meds for almost 2 months d/t being unable/unwilling to fill Rx. Appears Eliquis Rx was for 5 mg bid, but suspect patient may have been splitting tabs to prolong supply.   Significant events:  Today, 11/17/2019:  HL = 0.11 is sub therapeutic on heparin infusion  of 1500 units/hr  CBC: Hgb slightly low, Plt WNL  SCr 4.55, CrCl ~19 mL/min. AKI  No bleeding noted  Goal of Therapy: Heparin level 0.3-0.7 units/ml Monitor platelets by anticoagulation protocol: Yes  Plan:  Increase heparin drip to 1700 units/hr  Check heparin level in 6 hours  Daily CBC, daily heparin level  Monitor for signs of bleeding or thrombosis  Follow along for eventual transition to PO anticoagulation. Recommend case management consult to evaluate if any agents are cost prohibitive.   Dolly Rias RPh 11/17/2019, 6:23 AM

## 2019-11-18 ENCOUNTER — Inpatient Hospital Stay (HOSPITAL_COMMUNITY): Payer: Medicare Other

## 2019-11-18 DIAGNOSIS — N170 Acute kidney failure with tubular necrosis: Secondary | ICD-10-CM

## 2019-11-18 DIAGNOSIS — I509 Heart failure, unspecified: Secondary | ICD-10-CM | POA: Diagnosis not present

## 2019-11-18 DIAGNOSIS — N184 Chronic kidney disease, stage 4 (severe): Secondary | ICD-10-CM | POA: Diagnosis not present

## 2019-11-18 DIAGNOSIS — Z9114 Patient's other noncompliance with medication regimen: Secondary | ICD-10-CM

## 2019-11-18 DIAGNOSIS — N179 Acute kidney failure, unspecified: Secondary | ICD-10-CM | POA: Diagnosis not present

## 2019-11-18 DIAGNOSIS — Z91148 Patient's other noncompliance with medication regimen for other reason: Secondary | ICD-10-CM

## 2019-11-18 DIAGNOSIS — I5043 Acute on chronic combined systolic (congestive) and diastolic (congestive) heart failure: Secondary | ICD-10-CM | POA: Diagnosis not present

## 2019-11-18 DIAGNOSIS — L8990 Pressure ulcer of unspecified site, unspecified stage: Secondary | ICD-10-CM | POA: Insufficient documentation

## 2019-11-18 DIAGNOSIS — E875 Hyperkalemia: Secondary | ICD-10-CM | POA: Diagnosis not present

## 2019-11-18 HISTORY — PX: IR US GUIDE VASC ACCESS RIGHT: IMG2390

## 2019-11-18 HISTORY — PX: IR FLUORO GUIDE CV LINE RIGHT: IMG2283

## 2019-11-18 LAB — CBC
HCT: 39.5 % (ref 39.0–52.0)
Hemoglobin: 12.6 g/dL — ABNORMAL LOW (ref 13.0–17.0)
MCH: 28 pg (ref 26.0–34.0)
MCHC: 31.9 g/dL (ref 30.0–36.0)
MCV: 87.8 fL (ref 80.0–100.0)
Platelets: 167 10*3/uL (ref 150–400)
RBC: 4.5 MIL/uL (ref 4.22–5.81)
RDW: 17.8 % — ABNORMAL HIGH (ref 11.5–15.5)
WBC: 4 10*3/uL (ref 4.0–10.5)
nRBC: 0 % (ref 0.0–0.2)

## 2019-11-18 LAB — GLUCOSE, CAPILLARY
Glucose-Capillary: 172 mg/dL — ABNORMAL HIGH (ref 70–99)
Glucose-Capillary: 109 mg/dL — ABNORMAL HIGH (ref 70–99)
Glucose-Capillary: 124 mg/dL — ABNORMAL HIGH (ref 70–99)

## 2019-11-18 LAB — BASIC METABOLIC PANEL
Anion gap: 13 (ref 5–15)
BUN: 93 mg/dL — ABNORMAL HIGH (ref 8–23)
CO2: 18 mmol/L — ABNORMAL LOW (ref 22–32)
Calcium: 8.8 mg/dL — ABNORMAL LOW (ref 8.9–10.3)
Chloride: 107 mmol/L (ref 98–111)
Creatinine, Ser: 4.64 mg/dL — ABNORMAL HIGH (ref 0.61–1.24)
GFR, Estimated: 13 mL/min — ABNORMAL LOW (ref >60)
Glucose, Bld: 135 mg/dL — ABNORMAL HIGH (ref 70–99)
Potassium: 5.5 mmol/L — ABNORMAL HIGH (ref 3.5–5.1)
Sodium: 138 mmol/L (ref 135–145)

## 2019-11-18 LAB — HEMOGLOBIN A1C
Hgb A1c MFr Bld: 8.1 % — ABNORMAL HIGH (ref 4.8–5.6)
Mean Plasma Glucose: 186 mg/dL

## 2019-11-18 LAB — HEPATITIS B SURFACE ANTIBODY,QUALITATIVE: Hep B S Ab: REACTIVE — AB

## 2019-11-18 LAB — HEPARIN LEVEL (UNFRACTIONATED): Heparin Unfractionated: 0.68 IU/mL (ref 0.30–0.70)

## 2019-11-18 LAB — HEPATITIS B SURFACE ANTIGEN: Hepatitis B Surface Ag: NONREACTIVE

## 2019-11-18 LAB — HEPATITIS B CORE ANTIBODY, IGM: Hep B C IgM: NONREACTIVE

## 2019-11-18 MED ORDER — FUROSEMIDE 10 MG/ML IJ SOLN
120.0000 mg | Freq: Two times a day (BID) | INTRAVENOUS | Status: DC
  Administered 2019-11-19: 120 mg via INTRAVENOUS
  Filled 2019-11-18 (×4): qty 12
  Filled 2019-11-18: qty 2

## 2019-11-18 MED ORDER — HEPARIN SODIUM (PORCINE) 1000 UNIT/ML IJ SOLN
INTRAMUSCULAR | Status: AC
  Administered 2019-11-18: 2800 [IU]
  Filled 2019-11-18: qty 3

## 2019-11-18 MED ORDER — HEPARIN SOD (PORK) LOCK FLUSH 100 UNIT/ML IV SOLN
INTRAVENOUS | Status: AC
  Administered 2019-11-18: 500 [IU]
  Filled 2019-11-18: qty 5

## 2019-11-18 MED ORDER — HEPARIN SODIUM (PORCINE) 1000 UNIT/ML DIALYSIS: 2000.0000 [IU] | Freq: Once | INTRAMUSCULAR | Status: DC

## 2019-11-18 MED ORDER — CHLORHEXIDINE GLUCONATE CLOTH 2 % EX PADS
6.0000 | MEDICATED_PAD | Freq: Every day | CUTANEOUS | Status: DC
  Administered 2019-11-18 – 2019-11-21 (×4): 6 via TOPICAL

## 2019-11-18 MED ORDER — HEPARIN (PORCINE) 25000 UT/250ML-% IV SOLN
1750.0000 [IU]/h | INTRAVENOUS | Status: DC
  Administered 2019-11-18 – 2019-11-19 (×3): 1650 [IU]/h via INTRAVENOUS
  Administered 2019-11-20 (×2): 1550 [IU]/h via INTRAVENOUS
  Administered 2019-11-21 (×2): 1750 [IU]/h via INTRAVENOUS
  Filled 2019-11-18 (×6): qty 250

## 2019-11-18 MED ORDER — HEPARIN SODIUM (PORCINE) 1000 UNIT/ML IJ SOLN
INTRAMUSCULAR | Status: AC
  Administered 2019-11-18: 2.8 mL
  Filled 2019-11-18: qty 1

## 2019-11-18 MED ORDER — CEFAZOLIN SODIUM-DEXTROSE 1-4 GM/50ML-% IV SOLN
1.0000 g | Freq: Once | INTRAVENOUS | Status: AC
  Administered 2019-11-18: 1 g via INTRAVENOUS
  Filled 2019-11-18 (×3): qty 50

## 2019-11-18 MED ORDER — LIDOCAINE-EPINEPHRINE 1 %-1:100000 IJ SOLN
INTRAMUSCULAR | Status: AC
  Administered 2019-11-18: 3 mL via INTRADERMAL
  Filled 2019-11-18: qty 1

## 2019-11-18 NOTE — Progress Notes (Addendum)
IR placed R IJ dialysis cath today at [facility], pt removed dsg twice at [facility], with second time pulling on the line and stretching the sutures per [place], [place], he states he advanced the line back in about 6 inches and redressed--done under sterile technique he stated, this was done while pt was at [facility] before transport over to [facility]. Needs CXR to assess placement per Melissa in dialysis.  Needs dialysis tonight per orders.  Dr Royce Macadamia nephrology on call at this time, she was paged and informed of the above. Request call back when CXR is completed. [phone]

## 2019-11-18 NOTE — Progress Notes (Signed)
ANTICOAGULATION CONSULT NOTE  Pharmacy Consult for IV heparin Indication: atrial fibrillation  No Known Allergies  Patient Measurements: Height: 5\' 9"  (175.3 cm) Weight: 97.9 kg (215 lb 13.3 oz) IBW/kg (Calculated) : 70.7 Heparin Dosing Weight: 91 kg  Vital Signs: Temp: 97.5 F (36.4 C) (10/24 2059) BP: 131/84 (10/25 0732) Pulse Rate: 61 (10/25 0732)  Labs: Recent Labs    11/16/19 0418 11/16/19 0418 11/17/19 0414 11/17/19 0414 11/17/19 1204 11/17/19 2029 11/18/19 0421  HGB 12.3*   < > 12.7*  --   --   --  12.6*  HCT 40.4  --  39.7  --   --   --  39.5  PLT 164  --  179  --   --   --  167  HEPARINUNFRC 0.38   < > 0.11*   < > 0.38 0.45 0.68  CREATININE 4.12*  --  4.55*  --   --   --  4.64*   < > = values in this interval not displayed.    Estimated Creatinine Clearance: 18.6 mL/min (A) (by C-G formula based on SCr of 4.64 mg/dL (H)).   Medical History: Past Medical History:  Diagnosis Date  . CHF (congestive heart failure) ([place])   . Diabetes mellitus without complication ([place])   . Hypertension     Medications: Apixaban 2.5 mg PO BID PTA  Assessment: 11 yoM with PMH CHF, Afib previously on Eliquis, bioprosthetic AVR, HLD, DM2, admitted with scrotal/groin swelling; found to be in [facility] with AKI, possibly cardiorenal. Pharmacy to start heparin for Afib in setting of AKI.   Baseline INR, aPTT: n/a  Prior anticoagulation: Eliquis 2.5 mg bid; LD 10/18 - has been off most other meds for almost 2 months d/t being unable/unwilling to fill Rx. Appears Eliquis Rx was for 5 mg bid, but suspect patient may have been splitting tabs to prolong supply.   Significant events:  Today, 11/18/2019:  HL = 0.68 at upper end of therapeutic range on heparin infusion of 1700 units/hr  CBC: Hgb slightly low but stable, Plt WNL & stable  SCr 4.64, CrCl ~18 mL/min. AKI  No signs of bleeding documented.  Goal of Therapy: Heparin level 0.3-0.7 units/ml Monitor platelets by  anticoagulation protocol: Yes  Plan:  Decrease heparin infusion slightly to 1650 units/hr to prevent further increase into supratherapeutic range  Daily CBC, daily heparin level  Monitor for signs of bleeding or thrombosis  Follow along for eventual transition back to PO anticoagulation. Recommend case management consult to evaluate if any agents are cost prohibitive.   Peggyann Juba, PharmD, BCPS Pharmacy: [phone] 11/18/19 7:33 AM

## 2019-11-18 NOTE — Progress Notes (Signed)
Daily Progress Note   Patient Name: Kyle Newton       Date: 11/18/2019 DOB: 10-18-1955  Age: 64 y.o. MRN#: [id number] Attending Physician: Georgette Shell, MD Primary Care Physician: Patient, No Pcp Per Admit Date: 11/04/2019  Reason for Consultation/Follow-up: Establishing goals of care  Subjective: I met today with Kyle Newton.  We reviewed his clinical course of the last 24 hours.  Discussed continued decline in his renal function and being concerned that he may progress to the point of requiring dialysis.  He reports understanding severity of his condition, but it also is difficult to tell exactly how much he understands at times he does not fully engage in conversation.  On more than one occasion, he appears to "fall asleep" when attempting to discuss more difficult topics such as potential need for dialysis or aggressiveness of care in the event of worsening to a point of cardiac or respiratory arrest.  When speaking about less intense topics, however he seems able to stay awake and engage in conversation.  It appears to me that he is emotionally disengaging when needing to consider more serious topics of conversation.  He tells me that he does not want interventions such as dialysis, CPR, or intubation.  At the same time, when I tried to explore this with him further, it appears that he is more same that he is hopeful he does not require these rather than stating he would want to forego them if he reaches a point of requiring dialysis or suffering from cardiac or respiratory arrest.  Therefore, recommend continuation of full code and continue to progress conversation with him over the next few days.    We will also continue to try and elicit he would be his surrogate decision maker  in the event he reaches a point where he cannot make his own medical decisions.   Length of Stay: 4  Current Medications: Scheduled Meds:  . atorvastatin  10 mg Oral Daily  . bisacodyl  10 mg Oral Daily  . carvedilol  3.125 mg Oral BID WC  . insulin aspart  0-15 Units Subcutaneous TID WC  . isosorbide mononitrate  30 mg Oral Daily  . metolazone  10 mg Oral Daily  . sodium zirconium cyclosilicate  10 g Oral BID    Continuous Infusions: . sodium  chloride 10 mL/hr at 11/15/19 0306  . furosemide    . heparin 1,650 Units/hr (11/18/19 0849)    PRN Meds: sodium chloride, acetaminophen **OR** acetaminophen, ondansetron **OR** ondansetron (ZOFRAN) IV  Physical Exam         General: Alert, awake, in no acute distress.  HEENT: + JVD Heart: Regular rate and rhythm. No murmur appreciated. Lungs: Good air movement, clear Abdomen: Soft, nontender, distended, positive bowel sounds. + Scrotal edema Skin: Warm and dry Neuro: Grossly intact, nonfocal.  Vital Signs: BP (!) 140/94   Pulse (!) 59   Temp (!) 97.5 F (36.4 C)   Resp 18   Ht _0  (1.753 m)   Wt 97.9 kg   SpO2 97%   BMI 31.87 kg/m  SpO2: SpO2: 97 % O2 Device: O2 Device: Room Air O2 Flow Rate:    Intake/output summary:   Intake/Output Summary (Last 24 hours) at 11/18/2019 0944 Last data filed at 11/18/2019 0830 Gross per 24 hour  Intake 1844.73 ml  Output 3100 ml  Net -1255.27 ml   LBM: Last BM Date: 11/18/19 Baseline Weight: Weight: 95.7 kg Most recent weight: Weight: 97.9 kg       Palliative Assessment/Data:    Flowsheet Rows     Most Recent Value  Intake Tab  Referral Department Hospitalist  Unit at Time of Referral Med/Surg Unit  Palliative Care Primary Diagnosis Nephrology  Date Notified 11/15/19  Palliative Care Type New Palliative care  Reason for referral Clarify Goals of Care  Date of Admission 11/24/2019  Date first seen by Palliative Care 11/15/19  # of days Palliative referral response  time 0 Day(s)  # of days IP prior to Palliative referral 1  Clinical Assessment  Palliative Performance Scale Score 60%  Psychosocial & Spiritual Assessment  Palliative Care Outcomes  Patient/Family meeting held? Yes  Who was at the meeting? patient      Patient Active Problem List   Diagnosis Date Noted  . Anasarca 10/30/2019  . Congestive heart failure ([place]) 04/26/2019  . 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 History of seizures 04/26/2019  . Diabetes mellitus type 2 with complications ([place]) 14/78/2956  . Community acquired pneumonia 04/26/2019  . AKI (acute kidney injury) ([place]) 04/26/2019  . Atrial fibrillation ([place]) 04/26/2019  . Elevated troponin 04/26/2019  . Hyperkalemia   . Hypotension 06/23/2017  . Scabies 06/21/2017  . Hyperglycemia 06/17/2017    Palliative Care Assessment & Plan   Patient Profile: 64 y.o. male  with past medical history of CHF, AVR in 2006, AV nodal a ablation for A. fib, AICD implantation and diabetes admitted on 11/06/2019 with increased ascites and scrotal/penis swelling.  Per report, he has not been taking medications as directed at home.  Assessment: Patient Active Problem List   Diagnosis Date Noted  . Anasarca 11/13/2019  . Congestive heart failure ([place]) 04/26/2019  . 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 History of seizures 04/26/2019  . Diabetes mellitus type 2 with complications ([place]) 21/30/8657  . Community acquired pneumonia 04/26/2019  . AKI (acute kidney injury) ([place]) 04/26/2019  . Atrial fibrillation ([place]) 04/26/2019  . Elevated troponin 04/26/2019  . Hyperkalemia   . Hypotension 06/23/2017  . Scabies 06/21/2017  . Hyperglycemia 06/17/2017   Recommendations/Plan:  Full code/full scope treatment  Discussed again regarding goals of care and care plan moving forward in light of his continued renal compromise.  He again reports that he would not want aggressive  interventions, but in trying  to explore this further with him, it appears that he is more hopeful that he does not progress to the point of needing aggressive interventions rather than wanting to actually place any limits on his care plan moving forward.  We will continue to follow and progress conversation based upon his clinical course.  Goals of Care and Additional Recommendations:  Limitations on Scope of Treatment: Full Scope Treatment  Code Status:    Code Status Orders  (From admission, onward)         Start     Ordered   11/19/2019 1845  Full code  Continuous        11/10/2019 1844        Code Status History    Date Active Date Inactive Code Status Order ID Comments User Context   04/26/2019 1618 05/01/2019 1843 Full Code [id number]  Norval Morton, MD ED   06/23/2017 1901 06/25/2017 1637 Full Code [id number]  MoltRomelle Starcher, DO ED   06/17/2017 2111 06/21/2017 1728 Full Code [id number]  Valinda Party, DO ED   Advance Care Planning Activity       Prognosis:   Guarded  Discharge Planning:  To Be Determined  Care plan was discussed with patient  Thank you for allowing the Palliative Medicine Team to assist in the care of this patient.   Time In: 1745 Time Out: 1825 Total Time 40 Prolonged Time Billed No      Greater than 50%  of this time was spent counseling and coordinating care related to the above assessment and plan.  Micheline Rough, MD  Please contact Palliative Medicine Team phone at [phone] for questions and concerns.

## 2019-11-18 NOTE — Evaluation (Signed)
Physical Therapy Evaluation Patient Details Name: Kyle Newton MRN: [id number] DOB: Apr 25, 1955 Today's Date: 11/18/2019   History of Present Illness  Pt is 64 yo male with PMH of CHF s/p AICD, AVR, CKD, afib s/p AVN abliation, HTN, and DM.  Pt presented with scrotal edema and SHOB.  Pt admitted with CHF and AKI.  Clinical Impression  Pt admitted with above diagnosis. Pt requiring min A for transfers and limited distance ambulation.  Pt limited in ability to sit due to scrotal edema and limited ambulation due to increase work of breathing and RR.  Pt presenting with decreased mobility, strength, endurance, safety, and balance.  At this time recommend SNF, due to pt lives alone with limited support and currently with limited mobility.  Pt is expected to progress well with PT as edema is managed. Pt currently with functional limitations due to the deficits listed below (see PT Problem List). Pt will benefit from skilled PT to increase their independence and safety with mobility to allow discharge to the venue listed below.       Follow Up Recommendations SNF    Equipment Recommendations  Rolling walker with 5" wheels    Recommendations for Other Services       Precautions / Restrictions Precautions Precautions: Fall Precaution Comments: scrotal edema      Mobility  Bed Mobility Overal bed mobility: Needs Assistance Bed Mobility: Rolling;Sidelying to Sit;Sit to Sidelying Rolling: Min assist Sidelying to sit: Min assist     Sit to sidelying: Min assist General bed mobility comments: Pt unable to sit due to scrotal edema - went straight from sidelying to standing    Transfers Overall transfer level: Needs assistance Equipment used: 1 person hand held assist Transfers: Sit to/from Stand Sit to Stand: Min guard            Ambulation/Gait Ambulation/Gait assistance: Min assist Gait Distance (Feet): 15 Feet Assistive device: 1 person hand held assist Gait  Pattern/deviations: Step-to pattern;Decreased stride length;Wide base of support Gait velocity: decreased   General Gait Details: Declined RW; used HHA and occasional use of wall and bed rail for support; wide BOS due to scrotal edema; DOE of 3/4 and had to return to bed  Stairs            Wheelchair Mobility    Modified Rankin (Stroke Patients Only)       Balance Overall balance assessment: Needs assistance     Sitting balance - Comments: unable to sit due to scrotal edema - based on clinical judgement could likely sit with supervision   Standing balance support: No upper extremity supported Standing balance-Leahy Scale: Fair                               Pertinent Vitals/Pain Pain Assessment: No/denies pain    Home Living Family/patient expects to be discharged to:: Private residence Living Arrangements: Alone Available Help at Discharge: Other (Comment) (reports no one) Type of Home: Apartment Home Access: Stairs to enter   Entrance Stairs-Number of Steps: 1 Home Layout: One level Home Equipment: None Additional Comments: Pt providing limited PLOF and home environment.    Prior Function Level of Independence: Independent         Comments: Reports independent withot AD     Hand Dominance        Extremity/Trunk Assessment   Upper Extremity Assessment Upper Extremity Assessment: Generalized weakness    Lower Extremity Assessment Lower Extremity  Assessment: Generalized weakness (ROM WFL. Demonstrating at least 3/5 but further testing limited due to scrotal edema and unable to sit.)    Cervical / Trunk Assessment Cervical / Trunk Assessment: Normal  Communication   Communication: No difficulties  Cognition Arousal/Alertness: Awake/alert Behavior During Therapy: WFL for tasks assessed/performed Overall Cognitive Status: Impaired/Different from baseline Area of Impairment: Orientation;Attention;Problem solving                  Orientation Level: Time Current Attention Level: Selective         Problem Solving: Requires tactile cues;Requires verbal cues        General Comments General comments (skin integrity, edema, etc.): Pt on RA with increased RR and DOE of 3/4 with activity.  O2 sats were 94% and HR 70s-85 bpm.    Exercises     Assessment/Plan    PT Assessment Patient needs continued PT services  PT Problem List Decreased strength;Decreased mobility;Decreased safety awareness;Decreased activity tolerance;Decreased balance;Decreased knowledge of use of DME;Cardiopulmonary status limiting activity       PT Treatment Interventions DME instruction;Therapeutic activities;Gait training;Patient/family education;Therapeutic exercise;Stair training;Balance training;Functional mobility training    PT Goals (Current goals can be found in the Care Plan section)  Acute Rehab PT Goals Patient Stated Goal: return home; decrease edema PT Goal Formulation: With patient Time For Goal Achievement: 12/02/19 Potential to Achieve Goals: Good    Frequency Min 3X/week   Barriers to discharge Decreased caregiver support      Co-evaluation               AM-PAC PT "6 Clicks" Mobility  Outcome Measure Help needed turning from your back to your side while in a flat bed without using bedrails?: A Little Help needed moving from lying on your back to sitting on the side of a flat bed without using bedrails?: A Little Help needed moving to and from a bed to a chair (including a wheelchair)?: A Little Help needed standing up from a chair using your arms (e.g., wheelchair or bedside chair)?: A Little Help needed to walk in hospital room?: A Little Help needed climbing 3-5 steps with a railing? : A Lot 6 Click Score: 17    End of Session Equipment Utilized During Treatment: Gait belt Activity Tolerance: Patient limited by fatigue Patient left: in bed;with call bell/phone within reach;with bed alarm set Nurse  Communication: Mobility status;Other (comment) (bed pad wet and sheets were changed) PT Visit Diagnosis: Other abnormalities of gait and mobility (R26.89);Muscle weakness (generalized) (M62.81)    Time: 5465-6812 PT Time Calculation (min) (ACUTE ONLY): 20 min   Charges:   PT Evaluation $PT Eval Moderate Complexity: 1 Melina Schools, PT Acute Rehab Services Pager [phone] [facility] [phone]    Karlton Lemon 11/18/2019, 11:41 AM

## 2019-11-18 NOTE — Procedures (Signed)
Interventional Radiology Procedure Note  Procedure: Non-tunneled hemodialysis catheter placement.  Findings: Please refer to procedural dictation for full description.  20 cm 12 Fr, triple lumen hemodialysis catheter placed in right IJ, tip in right atrium.  Complications: None immediate  Estimated Blood Loss: <5 mL  Recommendations: Line ready for immediate use.   Ruthann Cancer, MD Pager: [phone]

## 2019-11-18 NOTE — Care Management Important Message (Signed)
Important Message  Patient Details IM Letter given to the Patient Name: Kyle Newton MRN: [id number] Date of Birth: 08-04-1955   Medicare Important Message Given:  Yes     Kerin Salen 11/18/2019, 12:07 PM

## 2019-11-18 NOTE — Progress Notes (Signed)
PROGRESS NOTE    Lavelle Akel  EHM:[id number] DOB: Feb 28, 1955 DOA: 11/16/2019 PCP: Patient, No Pcp Per   Brief Narrative: 64 year old male with history of systolic heart failure, type 2 diabetes, history of aortic valve replacement, ICD, very noncompliant to medications and to follow-up with his doctors came in with complaints of increased generalized edema scrotal edema shortness of breath and dyspnea on exertion. Patient with history of AICD and AVR in 2006 and AV nodal ablation for A. fib.   Assessment & Plan:   Active Problems:   Congestive heart failure (HCC)   AKI (acute kidney injury) ([place])   Anasarca    #1 Acute on chronic systolic CHF Exacerbation-AICD-patient admitted with complaints of 2 weeks of scrotal swelling with associated dyspnea on exertion and lower extremity edema and fatigue due to noncompliance to medications.  He had an admission in April of this year with similar complaints. Echo this admission ejection fraction 35 to 40% which is improved since his last echo in April ejection fraction 25 to 30%.  Akinesis of the left ventricular and entire apical segment, akinesis of the left ventricular apical lateral wall inferolateral wall septal wall inferior wall and the anterior wall. He is being treated with Lasix Imdur and Coreg.  However his renal functions have been worsening with diuresis.  He continues to be fluid overloaded with anasarca and large scrotal edema.  He is negative by 4.8 L.   Chest x-ray 11/18/2019 -right pleural effusion and volume loss of the right hemithorax.  Cardiomegaly.  Chest x-ray almost remains unchanged from admission. Discussed with nephrology plan to transfer him to [facility] in order to arrange for dialysis.  Cardiology following. Hydralazine on hold to allow room for diuresis.  #2 type 2 dm-he was supposed to be on glipizide 5 mg daily which he apparently was not taking.  His last hemoglobin A1c in April of this year was 8.8  CBG (last 3)   Recent Labs    11/17/19 1734 11/17/19 2055 11/18/19 0729  GLUCAP 75 117* 172*   Check a1c pending  #3 AKI on CKD stage IV-his baseline creatinine is around 2.3-2.7.  Creatinine on admission was 3.9 up to 4.55 today.  With diuresis his creatinine is trending up.  Renal ultrasound showed no obstruction no hydronephrosis.  Patient continues with volume overload/anasarca.  Will consult IR for temporary hemodialysis catheter and plan for dialysis.    #4 hyperlipedemia on Lipitor  #5 hyperkalemia potassium 5.7 on Lokelma. Continue Lokelma follow-up labs.  He was also treated with calcium gluconate glucose and insulin.  #6 isolated elevation of alkaline phosphatase other LFTs normal.  Follow levels.  #7 history of essential hypertension continue current meds Lasix Coreg and Imdur.  Blood pressure 114/70.  #7 scrotal edema due to CHF. Ultrasound shows marked severity scrotal edema with small amount of bilateral periepididymal fluid.  Continue scrotal support.  #8 history of polysubstance abuse  he has history of marijuana and cocaine abuse.  Urine drug screen positive for cocaine.  #9 history of aortic valve replacement needs outpatient follow-up.  He is on Eliquis as an outpatient.  Currently on IV heparin due to AKI.  #10 chronic atrial fibrillation/flutter- patient was on apixaban at home which is on hold due to AKI.  Currently on heparin drip.   Estimated body mass index is 31.87 kg/m as calculated from the following:   Height as of this encounter: 5\' 9"  (1.753 m).   Weight as of this encounter: 97.9 kg.  DVT  prophylaxis: Heparin  Code Status: Full Family Communication: None at bedside, I called the number available for [phone] without any response. Disposition Plan:  Status is: Inpatient   Dispo: The patient is from: Home              Anticipated d/c is to: Likely SNF              Anticipated d/c date is: > 3 days              Patient currently is not medically stable  to d/c.    Consultants:   Nephrology and cardiology  Procedures: None Antimicrobials: None Subjective: Patient resting in bed confused at times He thinks he is in the nursing home he admits he uses cocaine every now and then he reports he lives at home But was unable to tell me who he lives with..  Objective: Vitals:   11/17/19 1256 11/17/19 2059 11/18/19 0732 11/18/19 0846  BP: 130/86 125/79 131/84 (!) 140/94  Pulse: 63 61 61 (!) 59  Resp: 18 20 18    Temp:  (!) 97.5 F (36.4 C)    TempSrc:      SpO2: 99% 98% 97%   Weight:      Height:        Intake/Output Summary (Last 24 hours) at 11/18/2019 1022 Last data filed at 11/18/2019 0830 Gross per 24 hour  Intake 1604.73 ml  Output 3100 ml  Net -1495.27 ml   Filed Weights   11/02/2019 1525 11/15/19 1800 11/17/19 0559  Weight: 95.7 kg 98.9 kg 97.9 kg    Examination: He is snoring and talking to me he thinks he is in the nursing home  General exam: Appears confused Respiratory system: Diminished breath sounds at the bases right more than left to auscultation. Respiratory effort normal. Cardiovascular system: S1 & S2 heard, RRR. No JVD, murmurs, rubs, gallops or clicks. Gastrointestinal system: Abdomen is distended, soft and nontender. No organomegaly or masses felt. Normal bowel sounds heard.  Scrotal edema Central nervous system: Awake confused on and off  extremities trace pitting edema bilaterally, scrotal edema Skin: Multiple healed lesions on skin noted all over the body  psychiatry: Judgement and insight appear normal. Mood & affect appropriate.     Data Reviewed: I have personally reviewed following labs and imaging studies  CBC: Recent Labs  Lab 11/21/2019 1031 11/15/19 0021 11/16/19 0418 11/17/19 0414 11/18/19 0421  WBC 4.4 4.0 4.4 3.9* 4.0  NEUTROABS 2.7  --   --   --   --   HGB 13.7 12.0* 12.3* 12.7* 12.6*  HCT 43.8 38.8* 40.4 39.7 39.5  MCV 90.9 92.4 92.2 88.8 87.8  PLT 166 156 164 179 657   Basic  Metabolic Panel: Recent Labs  Lab 11/15/19 0610 11/15/19 1107 11/16/19 0418 11/17/19 0414 11/18/19 0421  NA 138 137 138 137 138  K 6.4* 5.7* 6.0* 5.7* 5.5*  CL 110 113* 112* 109 107  CO2 17* 16* 14* 17* 18*  GLUCOSE 130* 83 108* 131* 135*  BUN 74* 77* 80* 89* 93*  CREATININE 4.23* 4.19* 4.12* 4.55* 4.64*  CALCIUM 9.1 8.8* 8.9 8.7* 8.8*   GFR: Estimated Creatinine Clearance: 18.6 mL/min (A) (by C-G formula based on SCr of 4.64 mg/dL (H)). Liver Function Tests: Recent Labs  Lab 11/05/2019 1031 11/15/19 0021 11/15/19 0610 11/16/19 0418  AST 35 29 29 31   ALT 23 21 20 21   ALKPHOS 164* 140* 151* 154*  BILITOT 1.2 1.1 0.8 0.8  PROT 9.0* 7.7 7.9 7.8  ALBUMIN 3.3* 2.9* 3.0* 3.0*   No results for input(s): LIPASE, AMYLASE in the last 168 hours. No results for input(s): AMMONIA in the last 168 hours. Coagulation Profile: No results for input(s): INR, PROTIME in the last 168 hours. Cardiac Enzymes: No results for input(s): CKTOTAL, CKMB, CKMBINDEX, TROPONINI in the last 168 hours. BNP (last 3 results) No results for input(s): PROBNP in the last 8760 hours. HbA1C: No results for input(s): HGBA1C in the last 72 hours. CBG: Recent Labs  Lab 11/17/19 0851 11/17/19 1252 11/17/19 1734 11/17/19 2055 11/18/19 0729  GLUCAP 110* 170* 75 117* 172*   Lipid Profile: No results for input(s): CHOL, HDL, LDLCALC, TRIG, CHOLHDL, LDLDIRECT in the last 72 hours. Thyroid Function Tests: No results for input(s): TSH, T4TOTAL, FREET4, T3FREE, THYROIDAB in the last 72 hours. Anemia Panel: No results for input(s): VITAMINB12, FOLATE, FERRITIN, TIBC, IRON, RETICCTPCT in the last 72 hours. Sepsis Labs: No results for input(s): PROCALCITON, LATICACIDVEN in the last 168 hours.  Recent Results (from the past 240 hour(s))  Respiratory Panel by RT PCR (Flu A&B, Covid) - Nasopharyngeal Swab     Status: None   Collection Time: 11/04/2019 10:33 AM   Specimen: Nasopharyngeal Swab  Result Value Ref  Range Status   SARS Coronavirus 2 by RT PCR NEGATIVE NEGATIVE Final    Comment: (NOTE) SARS-CoV-2 target nucleic acids are NOT DETECTED.  The SARS-CoV-2 RNA is generally detectable in upper respiratoy specimens during the acute phase of infection. The lowest concentration of SARS-CoV-2 viral copies this assay can detect is 131 copies/mL. A negative result does not preclude SARS-Cov-2 infection and should not be used as the sole basis for treatment or other patient management decisions. A negative result may occur with  improper specimen collection/handling, submission of specimen other than nasopharyngeal swab, presence of viral mutation(s) within the areas targeted by this assay, and inadequate number of viral copies (<131 copies/mL). A negative result must be combined with clinical observations, patient history, and epidemiological information. The expected result is Negative.  Fact Sheet for Patients:  PinkCheek.be  Fact Sheet for Healthcare Providers:  GravelBags.it  This test is no t yet approved or cleared by the [place] FDA and  has been authorized for detection and/or diagnosis of SARS-CoV-2 by FDA under an Emergency Use Authorization (EUA). This EUA will remain  in effect (meaning this test can be used) for the duration of the COVID-19 declaration under Section 564(b)(1) of the Act, 21 U.S.C. section 360bbb-3(b)(1), unless the authorization is terminated or revoked sooner.     Influenza A by PCR NEGATIVE NEGATIVE Final   Influenza B by PCR NEGATIVE NEGATIVE Final    Comment: (NOTE) The Xpert Xpress SARS-CoV-2/FLU/RSV assay is intended as an aid in  the diagnosis of influenza from Nasopharyngeal swab specimens and  should not be used as a sole basis for treatment. Nasal washings and  aspirates are unacceptable for Xpert Xpress SARS-CoV-2/FLU/RSV  testing.  Fact Sheet for  Patients: PinkCheek.be  Fact Sheet for Healthcare Providers: GravelBags.it  This test is not yet approved or cleared by the [place] FDA and  has been authorized for detection and/or diagnosis of SARS-CoV-2 by  FDA under an Emergency Use Authorization (EUA). This EUA will remain  in effect (meaning this test can be used) for the duration of the  Covid-19 declaration under Section 564(b)(1) of the Act, 21  U.S.C. section 360bbb-3(b)(1), unless the authorization is  terminated or revoked. Performed at  [facility], [place] [address], [place], [place] [address]          Radiology Studies: DG CHEST PORT 1 VIEW  Result Date: 11/18/2019 CLINICAL DATA:  Shortness of breath EXAM: PORTABLE CHEST 1 VIEW COMPARISON:  11/23/2019 FINDINGS: Unchanged AP portable examination with a loculated appearing right pleural effusion and volume loss of the right hemithorax. The left lung is normally aerated. Cardiomegaly with left chest multi lead pacer. IMPRESSION: 1. Unchanged AP portable examination with a loculated appearing right pleural effusion and volume loss of the right hemithorax. The left lung is normally aerated. No new airspace opacity. 2.  Cardiomegaly. Electronically Signed   By: Eddie Candle M.D.   On: 11/18/2019 08:57        Scheduled Meds: . atorvastatin  10 mg Oral Daily  . bisacodyl  10 mg Oral Daily  . carvedilol  3.125 mg Oral BID WC  . Chlorhexidine Gluconate Cloth  6 each Topical Q0600  . insulin aspart  0-15 Units Subcutaneous TID WC  . isosorbide mononitrate  30 mg Oral Daily  . metolazone  10 mg Oral Daily  . sodium zirconium cyclosilicate  10 g Oral BID   Continuous Infusions: . sodium chloride 10 mL/hr at 11/15/19 0306  . furosemide    . heparin 1,650 Units/hr (11/18/19 0849)     LOS: 4 days   Georgette Shell, MD  11/18/2019, 10:22 AM

## 2019-11-18 NOTE — Progress Notes (Signed)
Progress Note  Patient Name: Kyle Newton Date of Encounter: 11/18/2019  [facility] Cardiologist: Pixie Casino, MD   Subjective   Feeling well. No chest pain, sob or palpitations.   Inpatient Medications    Scheduled Meds:  atorvastatin  10 mg Oral Daily   bisacodyl  10 mg Oral Daily   carvedilol  3.125 mg Oral BID WC   Chlorhexidine Gluconate Cloth  6 each Topical Q0600   insulin aspart  0-15 Units Subcutaneous TID WC   isosorbide mononitrate  30 mg Oral Daily   metolazone  10 mg Oral Daily   sodium zirconium cyclosilicate  10 g Oral BID   Continuous Infusions:  sodium chloride 10 mL/hr at 11/15/19 0306   furosemide     heparin 1,650 Units/hr (11/18/19 0849)   PRN Meds: sodium chloride, acetaminophen **OR** acetaminophen, ondansetron **OR** ondansetron (ZOFRAN) IV   Vital Signs    Vitals:   11/17/19 1256 11/17/19 2059 11/18/19 0732 11/18/19 0846  BP: 130/86 125/79 131/84 (!) 140/94  Pulse: 63 61 61 (!) 59  Resp: 18 20 18    Temp:  (!) 97.5 F (36.4 C)    TempSrc:      SpO2: 99% 98% 97%   Weight:      Height:        Intake/Output Summary (Last 24 hours) at 11/18/2019 1029 Last data filed at 11/18/2019 0830 Gross per 24 hour  Intake 1604.73 ml  Output 3100 ml  Net -1495.27 ml   Last 3 Weights 11/17/2019 11/15/2019 11/16/2019  Weight (lbs) 215 lb 13.3 oz 218 lb 0.6 oz 211 lb  Weight (kg) 97.9 kg 98.9 kg 95.709 kg      Telemetry    V pacing  - Personally Reviewed  ECG    N/A  Physical Exam   GEN: No acute distress.   Neck: + JVD Cardiac: RRR, no murmurs, rubs, or gallops.  Respiratory: Clear to auscultation bilaterally. GI: Soft, nontender, non-distended  MS:1-2+  edema; No deformity. Neuro:  Nonfocal  Psych: Normal affect   Labs    High Sensitivity Troponin:   Recent Labs  Lab 10/27/2019 1031  TROPONINIHS 43*      Chemistry Recent Labs  Lab 11/15/19 0021 11/15/19 0021 11/15/19 0610 11/15/19 1107  11/16/19 0418 11/17/19 0414 11/18/19 0421  NA 137   < > 138   < > 138 137 138  K 6.5*   < > 6.4*   < > 6.0* 5.7* 5.5*  CL 112*   < > 110   < > 112* 109 107  CO2 18*   < > 17*   < > 14* 17* 18*  GLUCOSE 150*   < > 130*   < > 108* 131* 135*  BUN 70*   < > 74*   < > 80* 89* 93*  CREATININE 4.05*   < > 4.23*   < > 4.12* 4.55* 4.64*  CALCIUM 8.8*   < > 9.1   < > 8.9 8.7* 8.8*  PROT 7.7  --  7.9  --  7.8  --   --   ALBUMIN 2.9*  --  3.0*  --  3.0*  --   --   AST 29  --  29  --  31  --   --   ALT 21  --  20  --  21  --   --   ALKPHOS 140*  --  151*  --  154*  --   --  BILITOT 1.1  --  0.8  --  0.8  --   --   GFRNONAA 16*   < > 15*   < > 15* 14* 13*  ANIONGAP 7   < > 11   < > 12 11 13    < > = values in this interval not displayed.     Hematology Recent Labs  Lab 11/16/19 0418 11/17/19 0414 11/18/19 0421  WBC 4.4 3.9* 4.0  RBC 4.38 4.47 4.50  HGB 12.3* 12.7* 12.6*  HCT 40.4 39.7 39.5  MCV 92.2 88.8 87.8  MCH 28.1 28.4 28.0  MCHC 30.4 32.0 31.9  RDW 17.9* 17.9* 17.8*  PLT 164 179 167    BNP Recent Labs  Lab 11/06/2019 1031  BNP 489.6*    Radiology    DG CHEST PORT 1 VIEW  Result Date: 11/18/2019 CLINICAL DATA:  Shortness of breath EXAM: PORTABLE CHEST 1 VIEW COMPARISON:  11/03/2019 FINDINGS: Unchanged AP portable examination with a loculated appearing right pleural effusion and volume loss of the right hemithorax. The left lung is normally aerated. Cardiomegaly with left chest multi lead pacer. IMPRESSION: 1. Unchanged AP portable examination with a loculated appearing right pleural effusion and volume loss of the right hemithorax. The left lung is normally aerated. No new airspace opacity. 2.  Cardiomegaly. Electronically Signed   By: Eddie Candle M.D.   On: 11/18/2019 08:57    Cardiac Studies   Echocardiogram 11/15/2019: 1. Left ventricular ejection fraction, by estimation, is 35 to 40%. The  left ventricle has moderately decreased function. The left ventricle has   no regional wall motion abnormalities. Left ventricular diastolic function  could not be evaluated. There is  akinesis of the left ventricular, entire apical segment. There is akinesis  of the left ventricular, apical lateral wall, inferolateral wall, septal  wall, inferior wall and anterior wall. There is akinesis of the left  ventricular, mid anteroseptal wall.  2. Right ventricular systolic function is mildly reduced. The right  ventricular size is normal. There is moderately elevated pulmonary artery  systolic pressure. The estimated right ventricular systolic pressure is  10.9 mmHg.  3. Left atrial size was severely dilated.  4. Right atrial size was moderately dilated.  5. The mitral valve is normal in structure. Trivial mitral valve  regurgitation. No evidence of mitral stenosis. Severe mitral annular  calcification.  6. The aortic valve has been repaired/replaced. Aortic valve  regurgitation is not visualized. No aortic stenosis is present. There is a  bioprosthetic valve present in the aortic position. Echo findings are  consistent with normal structure and function  of the aortic valve prosthesis.  7. The inferior vena cava is dilated in size with <50% respiratory  variability, suggesting right atrial pressure of 15 mmHg.  8. Recommend limited study with definity to more accurately define wall  motion   Patient Profile     64 y.o. male malewith a PMH of chronic combined CHF s/p AICD, AVR in 2006 with mild AS on TTE in 04/2019, permanent atrial fib/flutter s/p AVN abliation and DCPM, HTN and DMIIwho is being followed by cardiology for the evaluation of acute on chronic HF exacerbation.  Assessment & Plan    1. Acute on chronic combined CHF suspected due to non compliance 2. Acute on CKD IV  - Diuresed 4.8L - No daily weight - Still volume overload - Scr 4.46 today  - Diuretics managed by nephrologist. Appreciate managment  2. Permanent atrial flutter/fib  s/p AVN abliation and  DCPM - V paced at controlled rate - CHADSVASC score of 6 - Continue heparin >>needs to review oral anticoagulation (per note Eliquis held) - Continue coreg   3. Cocaine abuse - UDS positive for Cocaine - recommended cessation   4. History of Aortic Valve Replacement - Normal functioning valve by echo this admission   For questions or updates, please contact [place] Please consult [email or web address] for contact info under        SignedLeanor Kail, PA  11/18/2019, 10:29 AM

## 2019-11-18 NOTE — Progress Notes (Signed)
Patient transferred to [facility] via [place] transport. Telemoved. Dressing reapplied to right neck, patient removed dressing x2. [facility] transporter cleaned area and placed dressing. Patient educated on reason for dressing and risk for infection. Verbalized understanding. IV to RFA remained and Heparin continued at 16.5. Report given to Jaymie Mckiddy RN at [phone]. Patients belongings transported with him including his wallet, clothes, blanket and his personal cell phone.

## 2019-11-18 NOTE — Plan of Care (Signed)
Cross-coverage call note:   Spoke with radiology and line is in place and ok to use.    Will go ahead and give ancef 1 gram once as well.  Pt confirms no allergies per discussion with nursing and none listed on chart.   HD tonight as previously planned.  Spoke with HD RN  Claudia Desanctis, MD 6:44 PM 11/18/2019

## 2019-11-19 DIAGNOSIS — N179 Acute kidney failure, unspecified: Secondary | ICD-10-CM | POA: Diagnosis not present

## 2019-11-19 DIAGNOSIS — I5023 Acute on chronic systolic (congestive) heart failure: Secondary | ICD-10-CM | POA: Diagnosis not present

## 2019-11-19 DIAGNOSIS — N185 Chronic kidney disease, stage 5: Secondary | ICD-10-CM

## 2019-11-19 DIAGNOSIS — F1910 Other psychoactive substance abuse, uncomplicated: Secondary | ICD-10-CM | POA: Diagnosis not present

## 2019-11-19 DIAGNOSIS — Z9114 Patient's other noncompliance with medication regimen: Secondary | ICD-10-CM | POA: Diagnosis not present

## 2019-11-19 LAB — CBC
HCT: 33.3 % — ABNORMAL LOW (ref 39.0–52.0)
Hemoglobin: 10.7 g/dL — ABNORMAL LOW (ref 13.0–17.0)
MCH: 27.7 pg (ref 26.0–34.0)
MCHC: 32.1 g/dL (ref 30.0–36.0)
MCV: 86.3 fL (ref 80.0–100.0)
Platelets: 104 10*3/uL — ABNORMAL LOW (ref 150–400)
RBC: 3.86 MIL/uL — ABNORMAL LOW (ref 4.22–5.81)
RDW: 17.2 % — ABNORMAL HIGH (ref 11.5–15.5)
WBC: 4.7 10*3/uL (ref 4.0–10.5)
nRBC: 0 % (ref 0.0–0.2)

## 2019-11-19 LAB — GLUCOSE, CAPILLARY
Glucose-Capillary: 102 mg/dL — ABNORMAL HIGH (ref 70–99)
Glucose-Capillary: 133 mg/dL — ABNORMAL HIGH (ref 70–99)
Glucose-Capillary: 135 mg/dL — ABNORMAL HIGH (ref 70–99)
Glucose-Capillary: 147 mg/dL — ABNORMAL HIGH (ref 70–99)
Glucose-Capillary: 156 mg/dL — ABNORMAL HIGH (ref 70–99)

## 2019-11-19 LAB — BASIC METABOLIC PANEL
Anion gap: 10 (ref 5–15)
BUN: 74 mg/dL — ABNORMAL HIGH (ref 8–23)
CO2: 22 mmol/L (ref 22–32)
Calcium: 8.3 mg/dL — ABNORMAL LOW (ref 8.9–10.3)
Chloride: 105 mmol/L (ref 98–111)
Creatinine, Ser: 3.95 mg/dL — ABNORMAL HIGH (ref 0.61–1.24)
GFR, Estimated: 16 mL/min — ABNORMAL LOW (ref >60)
Glucose, Bld: 152 mg/dL — ABNORMAL HIGH (ref 70–99)
Potassium: 4.3 mmol/L (ref 3.5–5.1)
Sodium: 137 mmol/L (ref 135–145)

## 2019-11-19 LAB — HEMOGLOBIN A1C
Hgb A1c MFr Bld: 8.1 % — ABNORMAL HIGH (ref 4.8–5.6)
Mean Plasma Glucose: 186 mg/dL

## 2019-11-19 LAB — HEPARIN LEVEL (UNFRACTIONATED)
Heparin Unfractionated: 0.76 IU/mL — ABNORMAL HIGH (ref 0.30–0.70)
Heparin Unfractionated: <0.1 IU/mL — ABNORMAL LOW (ref 0.30–0.70)

## 2019-11-19 MED ORDER — HEPARIN SODIUM (PORCINE) 1000 UNIT/ML IJ SOLN
INTRAMUSCULAR | Status: AC
  Administered 2019-11-19: 1000 [IU]
  Filled 2019-11-19: qty 4

## 2019-11-19 MED ORDER — MIDODRINE HCL 5 MG PO TABS
10.0000 mg | ORAL_TABLET | Freq: Two times a day (BID) | ORAL | Status: DC
  Administered 2019-11-19 – 2019-11-21 (×4): 10 mg via ORAL
  Filled 2019-11-19 (×5): qty 2

## 2019-11-19 MED ORDER — CEFAZOLIN SODIUM-DEXTROSE 2-4 GM/100ML-% IV SOLN
2.0000 g | INTRAVENOUS | Status: AC
  Administered 2019-11-20: 2 g via INTRAVENOUS

## 2019-11-19 NOTE — Progress Notes (Signed)
[facility] Progress Note  Subjective: UF 1.3 L on HD yest, BP's in 90's limiting UF. Pt states feels better, "indigestion" better, more alert  Vitals:   11/18/19 2315 11/19/19 0011 11/19/19 0443 11/19/19 0906  BP: 100/64 100/74 92/80 (!) 136/93  Pulse: (!) 59 62 62 60  Resp: 17   20  Temp: 98 F (36.7 C) 98.4 F (36.9 C) (!) 97.5 F (36.4 C) 98.1 F (36.7 C)  TempSrc: Oral Oral Oral Oral  SpO2: 96% 97% 100% 100%  Weight: 96.1 kg     Height:        Exam: Gen awake, interactive, coughing +JVD Chest clear bilat to bases, no rales RRR no RG, soft SEM Abd soft ntnd no mass or ascites +bs GU 2+ penile/ scrotal edema Ext diffuse 2 + LE /  abd wall/ thoracic wall pitting edema Neuro alert and moving all ext, gen weak    Home meds:  - lipitor 10 /imdur 30 qd   CXR - IMPRESSION: 1. Cardiomegaly with a persistent small to moderate-sized right pleural effusion. 2. Right mid and lower lung opacities favored to reflect scarring and atelectasis although superimposed infection is not excluded.    ECHO 10/22 - LVEF 35-40%, no RWMA's, akinetic apical segment and also of LV apical lat/ inf-lat/ septal / inf and anterior walls. RV mildly reduced, mod ^PA pressures. Severe LAE, mod RAE. AV replaced/ reparied.      EKG - vent paced rhythm. 80's.          Date                                 Creat               eGFR   May-June 2019         2.51 >> 1.57    AKI episode, eGFR 26 > 46   April 2021                  3.04 >> 2.30    AKI episode, eGFR 21 > 29   Nov 14, 2019             3.92                 16   Nov 15, 2019             4.19                 15     UA negative, prot 100    UNa / UCr pending    Renal US - 10.5/ 12.4 cm kidneys, ^'d echo, no hydro, normal appearing bladder  Assessment/ Plan: 1. AoCKD IV - baseline creat from 04/2019 = 2.3- 2.6, eGFR 25- 29 ml/min. Admit creat 3.9. Renal US no sx of obstruction. Severe vol overload w/ anasarca up to the chest wall,  hx known systolic CHF, low EF. Suspect CKD due to HTN  + cardiorenal syndrome. Pt diuresed somewhat but vol excess did not improve and developed uremic symptoms w / rising creat to 4.6 so was started on HD 10/25. Has temp cath per IR 10/25. Will need daily HD for a while d/t anasarca. Doubt he will be able to come off of dialysis w/ the severe vol overload, will treat as ESRD. Will proceed w/ CLIP process and consult [facility] for perm access/ TDC. Have d/w pt and questions  answered.  2. HyperK+ - resolved w HD 3. Hypotension - starting midodrine 10 bid here 4. A/C systolic CHF - LVEF 67-42%, on coreg 5. Perm afib/ flutter - on eliquis at home, IV hep here 6. HTN - dc'd hydralazine d/t low BP's 7. SP ICD 8. Hx AVR - on IV hep     Kelly Splinter 11/18/2019, 10:34 AM   Recent Labs  Lab 11/18/19 0421 11/19/19 0346  K 5.5* 4.3  BUN 93* 74*  CREATININE 4.64* 3.95*  CALCIUM 8.8* 8.3*  HGB 12.6* 10.7*   Inpatient medications: . atorvastatin  10 mg Oral Daily  . bisacodyl  10 mg Oral Daily  . carvedilol  3.125 mg Oral BID WC  . Chlorhexidine Gluconate Cloth  6 each Topical Q0600  . heparin  2,000 Units Dialysis Once in dialysis  . insulin aspart  0-15 Units Subcutaneous TID WC  . isosorbide mononitrate  30 mg Oral Daily  . metolazone  10 mg Oral Daily  . midodrine  10 mg Oral BID WC   . sodium chloride 10 mL/hr at 11/15/19 0306  . furosemide 120 mg (11/19/19 0209)  . heparin 1,550 Units/hr (11/19/19 1024)   sodium chloride, acetaminophen **OR** acetaminophen, ondansetron **OR** ondansetron (ZOFRAN) IV

## 2019-11-19 NOTE — Progress Notes (Signed)
This RN attempted to have patient sign consent form for tunneled hemodialysis catheter, and right AV fistula/graft.  Patient stated that he was not aware of procedure, and will not sign the consent form.

## 2019-11-19 NOTE — Progress Notes (Signed)
PT Cancellation Note  Patient Details Name: Kyle Newton MRN: [id number] DOB: [birth year]   Cancelled Treatment:    Reason Eval/Treat Not Completed: Other (comment) Pt requesting to eat. Will follow up as schedule allows.   Lou Miner, DPT  Acute Rehabilitation Services  Pager: [phone] Office: [phone]    Rudean Hitt 11/19/2019, 11:56 AM

## 2019-11-19 NOTE — Progress Notes (Signed)
[facility] Progress Note  Subjective: UOP good 2 L yest, creat up slightly to 4.6 today. BUN 93. Pt having coughing spells in the room, SpO2 are good however on RA.  CXR now shows slightly ^vascularity but no sig edema. Wts are up from admission.   Vitals:   11/18/19 2315 11/19/19 0011 11/19/19 0443 11/19/19 0906  BP: 100/64 100/74 92/80 (!) 136/93  Pulse: (!) 59 62 62 60  Resp: 17   20  Temp: 98 F (36.7 C) 98.4 F (36.9 C) (!) 97.5 F (36.4 C) 98.1 F (36.7 C)  TempSrc: Oral Oral Oral Oral  SpO2: 96% 97% 100% 100%  Weight: 96.1 kg     Height:        Exam: Gen awake, interactive +JVD Chest clear bilat to bases, no rales RRR no RG, soft SEM Abd soft ntnd no mass or ascites +bs GU 2+ penile/ scrotal edema Ext diffuse LE /  abd wall/ thoracic wall pitting edema Neuro alert and moving all ext, gen weak    Home meds:  - lipitor 10 /imdur 30 qd   CXR - IMPRESSION: 1. Cardiomegaly with a persistent small to moderate-sized right pleural effusion. 2. Right mid and lower lung opacities favored to reflect scarring and atelectasis although superimposed infection is not excluded.    ECHO 10/22 - LVEF 35-40%, no RWMA's, akinetic apical segment and also of LV apical lat/ inf-lat/ septal / inf and anterior walls. RV mildly reduced, mod ^PA pressures. Severe LAE, mod RAE. AV replaced/ reparied.      EKG - vent paced rhythm. 80's.          Date                                 Creat               eGFR   May-June 2019         2.51 >> 1.57    AKI episode, eGFR 26 > 46   April 2021                  3.04 >> 2.30    AKI episode, eGFR 21 > 29   Nov 14, 2019             3.92                 16   Nov 15, 2019             4.19                 15     UA negative, prot 100    UNa / UCr pending    Renal US - 10.5/ 12.4 cm kidneys, ^'d echo, no hydro, normal appearing bladder  Assessment/ Plan: 1. AoCKD IV - baseline creat from 04/2019 = 2.3- 2.6, eGFR 25- 29 ml/min. Admit  creat 3.9 here, stable at 4.1 today. Renal US no sx of obstruction. Severe vol overload w/ anasarca up to lower abd wall, hx known systolic CHF, low EF. Suspect HTN'sive renal failure w/ cardiorenal syndrome as well. Pt diuresing somewhat however weights are up and anasarca no better, creat rising. Will need dialysis to get control of volume status. Plan tx to [facility], consult IR for temp HD cath and plan HD later today at [facility]. Have d/w pt and questions answered.  2. HyperK+ - changed to  renal/ carb mod diet, cont lokelma, HD 3. A/C systolic CHF - LVEF 62-22%, on coreg 4. Perm afib/ flutter - on eliquis at home, IV hep here 5. HTN - dc'd hydralazine while diuresing 6. SP ICD 7. Hx AVR - on IV hep     Kelly Splinter 11/18/2019, 10:34 AM   Recent Labs  Lab 11/18/19 0421 11/19/19 0346  K 5.5* 4.3  BUN 93* 74*  CREATININE 4.64* 3.95*  CALCIUM 8.8* 8.3*  HGB 12.6* 10.7*   Inpatient medications: . atorvastatin  10 mg Oral Daily  . bisacodyl  10 mg Oral Daily  . carvedilol  3.125 mg Oral BID WC  . Chlorhexidine Gluconate Cloth  6 each Topical Q0600  . heparin  2,000 Units Dialysis Once in dialysis  . insulin aspart  0-15 Units Subcutaneous TID WC  . isosorbide mononitrate  30 mg Oral Daily  . metolazone  10 mg Oral Daily  . midodrine  10 mg Oral BID WC   . sodium chloride 10 mL/hr at 11/15/19 0306  . furosemide 120 mg (11/19/19 0209)  . heparin 1,550 Units/hr (11/19/19 1024)   sodium chloride, acetaminophen **OR** acetaminophen, ondansetron **OR** ondansetron (ZOFRAN) IV

## 2019-11-19 NOTE — Progress Notes (Signed)
ANTICOAGULATION CONSULT NOTE - Follow Up Consult  Pharmacy Consult for IV Heparin Indication: atrial fibrillation  No Known Allergies  Patient Measurements: Height: 5\' 9"  (175.3 cm) Weight: 94.4 kg (208 lb 1.8 oz) IBW/kg (Calculated) : 70.7 Heparin Dosing Weight: 91 kg  Vital Signs: Temp: 99 F (37.2 C) (10/26 1734) Temp Source: Oral (10/26 1734) BP: 121/69 (10/26 1734) Pulse Rate: 59 (10/26 1734)  Labs: Recent Labs    11/17/19 0414 11/17/19 1204 11/18/19 0421 11/19/19 0346 11/19/19 1840  HGB 12.7*  --  12.6* 10.7*  --   HCT 39.7  --  39.5 33.3*  --   PLT 179  --  167 104*  --   HEPARINUNFRC 0.11*   < > 0.68 0.76* <0.10*  CREATININE 4.55*  --  4.64* 3.95*  --    < > = values in this interval not displayed.    Estimated Creatinine Clearance: 21.4 mL/min (A) (by C-G formula based on SCr of 3.95 mg/dL (H)).  Assessment: 64 yr old male with PMH CHF, Afib previously on apixaban, bioprosthetic AVR, HLD, DM2, admitted with scrotal/groin swelling; found to be in [facility] with AKI, possibly cardiorenal. Pharmacy was consulted to start heparin for Afib in setting of AKI.  Pt was transferred from [facility] to [facility] on 10/25 for hemodialysis; planned for [facility] placement by [facility] tomorrow.  Prior anticoagulation: apixaban 2.5 mg BID; LD 10/18 - has been off most other meds for almost 2 months, due to being unable/unwilling to fill Rx. Appears apixaban Rx was for 5 mg BID, but suspect patient may have been splitting tabs to prolong supply.   Heparin level ~8 hrs after heparin infusion was decreased to 1550 units/hr was <0.10 units/ml, which is below the goal range for this pt. H/H 10.7/33.3, platelets 104. Per RN, pt's heparin IV was turned off when he returned from HD; RN restarted heparin infusion at 1550 units/hr at ~1900; no bleeding issues observed.  Goal of Therapy:  Heparin level 0.3-0.7 units/ml Monitor platelets by anticoagulation protocol: Yes   Plan:  Continue  heparin infusion at 1550 units/hr Check heparin level 8 hrs after heparin infusion was restarted post HD Monitor daily heparin level, CBC Monitor for signs/symptoms of bleeding Follow for eventual transition back to oral anticoagulation  Gillermina Hu, PharmD, BCPS, [place] Pharmacist 11/19/2019,8:29 PM

## 2019-11-19 NOTE — Progress Notes (Signed)
Renal Navigator overheard HD RN trying to get patient to be calm and still so he could receive his treatment. Navigator went to bedside to offer support. Patient seemed slightly agitated. Navigator asked him to look at Navigator, as this was direction RN needed him to face. Navigator asked if patient knew where he was. He said, "yes, the operating room!" Navigator explained that he is in dialysis and that we are not operating on him right now. Navigator told patient that a doctor just came to his bedside to talk about an operation that he will be having in the future ([facility]-Dr. Early) so that he can have a permanent access for dialysis, but again, that is not what we are doing right now. He said, "ok" and shook his head affirmatively. Renal Navigator explained that after he has the surgery the doctor talked to him about, he will be able to get the catheter out of his neck, eventually. Patient said, "this will come out?" Again, Navigator said, eventually. He said, "ok."  Navigator asked patient if he feels ok, or if he is too hot or too cold (something Navigator could adjust if needed). Patient said, "I'm scared." Navigator acknowledged and validated his feelings and thanked him for sharing how he is feeling. Navigator explained that the machine he is hooked to is cleaning his blood right now because his kidneys are able to anymore. Navigator added that all he has to do is lie still, rest and let the machine do the rest. Patient was receptive to relaxation techniques used by Navigator and closed his eyes and began to snore.   Alphonzo Cruise, [place] Renal Navigator [phone]

## 2019-11-19 NOTE — Progress Notes (Signed)
ANTICOAGULATION CONSULT NOTE - Follow Up Consult  Pharmacy Consult for Heparin Indication: atrial fibrillation  No Known Allergies  Patient Measurements: Height: 5\' 9"  (175.3 cm) Weight: 96.1 kg (211 lb 13.8 oz) IBW/kg (Calculated) : 70.7 Heparin Dosing Weight: 91 kg  Vital Signs: Temp: 98.1 F (36.7 C) (10/26 0906) Temp Source: Oral (10/26 0906) BP: 136/93 (10/26 0906) Pulse Rate: 60 (10/26 0906)  Labs: Recent Labs    11/17/19 0414 11/17/19 0414 11/17/19 1204 11/17/19 2029 11/18/19 0421 11/19/19 0346  HGB 12.7*   < >  --   --  12.6* 10.7*  HCT 39.7  --   --   --  39.5 33.3*  PLT 179  --   --   --  167 104*  HEPARINUNFRC 0.11*  --    < > 0.45 0.68 0.76*  CREATININE 4.55*  --   --   --  4.64* 3.95*   < > = values in this interval not displayed.    Estimated Creatinine Clearance: 21.6 mL/min (A) (by C-G formula based on SCr of 3.95 mg/dL (H)).  Assessment:  30 yoM with PMH CHF, Afib previously on Eliquis, bioprosthetic AVR, HLD, DM2, admitted with scrotal/groin swelling; found to be in [facility] with AKI, possibly cardiorenal. Pharmacy to start heparin for Afib in setting of AKI.   Baseline INR, aPTT: n/a  Prior anticoagulation: Eliquis 2.5 mg bid; LD 10/18 - has been off most other meds for almost 2 months d/t being unable/unwilling to fill Rx. Appears Eliquis Rx was for 5 mg bid, but suspect patient may have been splitting tabs to prolong supply.    Transferred from [place] to [place] on 10/25 for hemodialysis.      Heparin level is supratherapeutic (0.76) on 1650 units/hr.   Hgb and platelet count trended down. RN reports old blood at HD catheter site. No known bleeding.   Goal of Therapy:  Heparin level 0.3-0.7 units/ml Monitor platelets by anticoagulation protocol: Yes   Plan:   Decrease heparin drip to 1550 units/hr  Heparin level ~8 hrs after rate change.  Daily heparin level and CBC.  Follow for eventual transition back to oral anticoagulation.    Arty Baumgartner, [place] Phone: [phone] 11/19/2019,9:45 AM

## 2019-11-19 NOTE — Progress Notes (Signed)
[facility] Progress Note  Subjective: UF 1.3 L on HD yest, BP's in 90's limiting UF. Pt states feels better, "indigestion" better, more alert  Vitals:   11/18/19 2315 11/19/19 0011 11/19/19 0443 11/19/19 0906  BP: 100/64 100/74 92/80 (!) 136/93  Pulse: (!) 59 62 62 60  Resp: 17   20  Temp: 98 F (36.7 C) 98.4 F (36.9 C) (!) 97.5 F (36.4 C) 98.1 F (36.7 C)  TempSrc: Oral Oral Oral Oral  SpO2: 96% 97% 100% 100%  Weight: 96.1 kg     Height:        Exam: Gen awake, interactive, coughing +JVD Chest clear bilat to bases, no rales RRR no RG, soft SEM Abd soft ntnd no mass or ascites +bs GU 2+ penile/ scrotal edema Ext diffuse 2 + LE /  abd wall/ thoracic wall pitting edema Neuro alert and moving all ext, gen weak    Home meds:  - lipitor 10 /imdur 30 qd   CXR - IMPRESSION: 1. Cardiomegaly with a persistent small to moderate-sized right pleural effusion. 2. Right mid and lower lung opacities favored to reflect scarring and atelectasis although superimposed infection is not excluded.    ECHO 10/22 - LVEF 35-40%, no RWMA's, akinetic apical segment and also of LV apical lat/ inf-lat/ septal / inf and anterior walls. RV mildly reduced, mod ^PA pressures. Severe LAE, mod RAE. AV replaced/ reparied.      EKG - vent paced rhythm. 80's.          Date                                 Creat               eGFR   May-June 2019         2.51 >> 1.57    AKI episode, eGFR 26 > 46   April 2021                  3.04 >> 2.30    AKI episode, eGFR 21 > 29   Nov 14, 2019             3.92                 16   Nov 15, 2019             4.19                 15     UA negative, prot 100    UNa / UCr pending    Renal US - 10.5/ 12.4 cm kidneys, ^'d echo, no hydro, normal appearing bladder  Assessment/ Plan: 1. AoCKD IV - baseline creat from 04/2019 = 2.3- 2.6, eGFR 25- 29 ml/min. Admit creat 3.9. Renal US no sx of obstruction. Severe vol overload w/ anasarca up to the chest wall,  hx known systolic CHF, low EF. Suspect CKD due to HTN  + cardiorenal syndrome. Pt diuresed somewhat but vol excess did not improve and developed uremic symptoms w / rising creat to 4.6 so was started on HD 10/25. Has temp cath per IR 10/25. Will need daily HD for a while d/t anasarca. Doubt he will be able to come off of dialysis w/ the severe vol overload. Will proceed w/ CLIP process and consult VVS for perm access/ TDC. Have d/w pt and questions answered.  2. HyperK+ -  resolved w HD 3. Hypotension - starting midodrine 10 bid here 4. A/C systolic CHF - LVEF 55-37%, on coreg 5. Perm afib/ flutter - on eliquis at home, IV hep here 6. HTN - dc'd hydralazine d/t low BP's 7. SP ICD 8. Hx AVR - on IV hep     Kelly Splinter 11/18/2019, 10:34 AM   Recent Labs  Lab 11/18/19 0421 11/19/19 0346  K 5.5* 4.3  BUN 93* 74*  CREATININE 4.64* 3.95*  CALCIUM 8.8* 8.3*  HGB 12.6* 10.7*   Inpatient medications: . atorvastatin  10 mg Oral Daily  . bisacodyl  10 mg Oral Daily  . carvedilol  3.125 mg Oral BID WC  . Chlorhexidine Gluconate Cloth  6 each Topical Q0600  . heparin  2,000 Units Dialysis Once in dialysis  . insulin aspart  0-15 Units Subcutaneous TID WC  . isosorbide mononitrate  30 mg Oral Daily  . metolazone  10 mg Oral Daily  . midodrine  10 mg Oral BID WC   . sodium chloride 10 mL/hr at 11/15/19 0306  . furosemide 120 mg (11/19/19 0209)  . heparin 1,550 Units/hr (11/19/19 1024)   sodium chloride, acetaminophen **OR** acetaminophen, ondansetron **OR** ondansetron (ZOFRAN) IV

## 2019-11-19 NOTE — Anesthesia Preprocedure Evaluation (Addendum)
Anesthesia Evaluation  Patient identified by MRN, date of birth, ID band Patient confused    Reviewed: Allergy & Precautions, NPO status , Patient's Chart, lab work & pertinent test results  Airway Mallampati: II  TM Distance: >3 FB Neck ROM: Full    Dental  (+) Edentulous Upper, Partial Lower   Pulmonary Current Smoker,    breath sounds clear to auscultation       Cardiovascular hypertension, Pt. on medications and Pt. on home beta blockers +CHF  + dysrhythmias Atrial Fibrillation + pacemaker  Rhythm:Regular Rate:Normal  S/P AVR  11/15/19 TTE 1. Left ventricular ejection fraction, by estimation, is 35 to 40%. The  left ventricle has moderately decreased function. The left ventricle has  no regional wall motion abnormalities. Left ventricular diastolic function  could not be evaluated. There is  akinesis of the left ventricular, entire apical segment. There is akinesis  of the left ventricular, apical lateral wall, inferolateral wall, septal  wall, inferior wall and anterior wall. There is akinesis of the left  ventricular, mid anteroseptal wall.  2. Right ventricular systolic function is mildly reduced. The right  ventricular size is normal. There is moderately elevated pulmonary artery  systolic pressure. The estimated right ventricular systolic pressure is  60.4 mmHg.  3. Left atrial size was severely dilated.  4. Right atrial size was moderately dilated.  5. The mitral valve is normal in structure. Trivial mitral valve  regurgitation. No evidence of mitral stenosis. Severe mitral annular  calcification.  6. The aortic valve has been repaired/replaced. Aortic valve  regurgitation is not visualized. No aortic stenosis is present. There is a  bioprosthetic valve present in the aortic position. Echo findings are  consistent with normal structure and function  of the aortic valve prosthesis.    Neuro/Psych     GI/Hepatic Neg liver ROS,   Endo/Other  diabetes, Type 2, Insulin Dependent  Renal/GU CRFRenal diseaseK+ 4.3 Cr 3.95     Musculoskeletal   Abdominal   Peds  Hematology Hgb 10.7 Plt 104   Anesthesia Other Findings   Reproductive/Obstetrics                            Anesthesia Physical Anesthesia Plan  ASA: IV  Anesthesia Plan: MAC   Post-op Pain Management:    Induction: Intravenous  PONV Risk Score and Plan: Treatment may vary due to age or medical condition and Ondansetron  Airway Management Planned:   Additional Equipment: None  Intra-op Plan:   Post-operative Plan:   Informed Consent: I have reviewed the patients History and Physical, chart, labs and discussed the procedure including the risks, benefits and alternatives for the proposed anesthesia with the patient or authorized representative who has indicated his/her understanding and acceptance.     Dental advisory given  Plan Discussed with: CRNA and Anesthesiologist  Anesthesia Plan Comments: (LMA GA Patient appears pacer dependent, place magnet over AICD)      Anesthesia Quick Evaluation

## 2019-11-19 NOTE — Progress Notes (Signed)
Patient ID: Artemus Romanoff, male   DOB: Nov 07, 1955, 64 y.o.   MRN: [id number]  PROGRESS NOTE    Usiel Astarita  NOI:[id number] DOB: [birth year] DOA: 11/19/2019 PCP: Patient, No Pcp Per   Brief Narrative:  64 year old male with history of chronic systolic heart failure, diabetes mellitus type 2, aortic valve replacement and AICD, chronic atrial fibrillation status post AV nodal ablation, chronic renal disease stage IV, diabetes mellitus type 2, very noncompliant to medications and follow-up presented with generalized edema, scrotal edema and dyspnea on exertion.  Patient was admitted for acute on chronic systolic CHF exacerbation.  Cardiology was consulted.  He was diuresed.  Renal function worsened.  Nephrology recommended transfer to [facility] for initiation of hemodialysis.  He was transferred to [facility] on 11/18/2019 and was started on hemodialysis on 11/18/2019.  Assessment & Plan:   Acute on chronic systolic heart failure -Presented with worsening scrotal swelling, lower extremity edema, dyspnea on exertion and fatigue.  Patient is very noncompliant to medications and follow-up -Echo during this admission shows EF of 35 to 40%, improved since his last echo in April with a EF of 25 to 30%. -Cardiology was following the patient but signed off on 11/18/2019.  Currently on Coreg, Imdur, metolazone and Lasix drip.  Volume being managed by nephrology. -Strict input and output.  Daily weights.  Fluid restriction.  Negative balance of 6470.6 cc since admission  Acute kidney injury on chronic kidney disease stage IV -Baseline creatinine is around 2.3-2.7 -Creatinine 3.9 on admission, 4.64 yesterday.  3.95 today. -Nephrology following.  Had nontunneled hemodialysis catheter placement by IR on 11/18/2019.  He was transferred to [facility] on 11/18/2019 and was started on hemodialysis on 11/18/2019.  Follow further nephrology recommendations.  Hyperkalemia -Received  Lokelma during this hospitalization.  Potassium 4.3 today.  Essential hypertension -Blood pressure stable.  Continue Coreg, Imdur, metolazone and Lasix drip  Diabetes mellitus type 2 -A1c 8.1.  Continue CBGs with SSI.  Patient was not compliant with glipizide as an outpatient -Carb modified diet  Scrotal edema due to CHF -Ultrasound showed marked scrotal edema with small amount of bilateral periepididymal fluid.  Continue scrotal support  History of polysubstance abuse -Has history of marijuana and cocaine abuse.  Urine drug screen positive for cocaine.  Social worker consult  History of aortic valve replacement Chronic atrial fibrillation/flutter -Eliquis on hold.  Currently on heparin drip.  Currently rate controlled.  Continue Coreg  Hyperlipidemia -Continue statin  Generalized conditioning -Overall prognosis is guarded to poor given patient's noncompliance and polysubstance abuse.  Palliative care has evaluated the patient during this hospitalization and patient remains full code.  DVT prophylaxis: Heparin drip Code Status: Full Family Communication: None at bedside Disposition Plan: Status is: Inpatient  Remains inpatient appropriate because:Inpatient level of care appropriate due to severity of illness   Dispo: The patient is from: Home              Anticipated d/c is to: Home              Anticipated d/c date is: > 3 days              Patient currently is not medically stable to d/c.  Consultants: Cardiology/nephrology/palliative care/IR  Procedures:  Echo  nontunneled hemodialysis catheter placement by IR on 11/18/2019 Hemodialysis started on 11/18/2019  Antimicrobials: None   Subjective: Patient seen and examined at bedside.  Very poor historian, confused at times.  No overnight fever, vomiting, worsening  shortness of breath reported.  Objective: Vitals:   11/18/19 2315 11/19/19 0011 11/19/19 0443 11/19/19 0906  BP: 100/64 100/74 92/80 (!) 136/93    Pulse: (!) 59 62 62 60  Resp: 17   20  Temp: 98 F (36.7 C) 98.4 F (36.9 C) (!) 97.5 F (36.4 C) 98.1 F (36.7 C)  TempSrc: Oral Oral Oral Oral  SpO2: 96% 97% 100% 100%  Weight: 96.1 kg     Height:        Intake/Output Summary (Last 24 hours) at 11/19/2019 1001 Last data filed at 11/19/2019 0900 Gross per 24 hour  Intake 1484.88 ml  Output 2650 ml  Net -1165.12 ml   Filed Weights   11/17/19 0559 11/18/19 1958 11/18/19 2315  Weight: 97.9 kg 96.3 kg 96.1 kg    Examination:  General exam: Appears calm and comfortable.  Looks older than stated age and chronically ill. Respiratory system: Bilateral decreased breath sounds at bases with some scattered crackles Cardiovascular system: S1 & S2 heard, Rate controlled Gastrointestinal system: Abdomen is slightly distended, soft and nontender.  Bowel sounds heard  extremities: Bilateral lower extremity edema present.  No clubbing Central nervous system: Awake, very poor historian, slightly confused intermittently.  No focal neurological deficits. Moving extremities Skin: Multiple healed lesions on skin noted all over the body Psychiatry: Could not be assessed because of patient being confused    Data Reviewed: I have personally reviewed following labs and imaging studies  CBC: Recent Labs  Lab 11/11/2019 1031 11/08/2019 1031 11/15/19 0021 11/16/19 0418 11/17/19 0414 11/18/19 0421 11/19/19 0346  WBC 4.4   < > 4.0 4.4 3.9* 4.0 4.7  NEUTROABS 2.7  --   --   --   --   --   --   HGB 13.7   < > 12.0* 12.3* 12.7* 12.6* 10.7*  HCT 43.8   < > 38.8* 40.4 39.7 39.5 33.3*  MCV 90.9   < > 92.4 92.2 88.8 87.8 86.3  PLT 166   < > 156 164 179 167 104*   < > = values in this interval not displayed.   Basic Metabolic Panel: Recent Labs  Lab 11/15/19 1107 11/16/19 0418 11/17/19 0414 11/18/19 0421 11/19/19 0346  NA 137 138 137 138 137  K 5.7* 6.0* 5.7* 5.5* 4.3  CL 113* 112* 109 107 105  CO2 16* 14* 17* 18* 22  GLUCOSE 83 108*  131* 135* 152*  BUN 77* 80* 89* 93* 74*  CREATININE 4.19* 4.12* 4.55* 4.64* 3.95*  CALCIUM 8.8* 8.9 8.7* 8.8* 8.3*   GFR: Estimated Creatinine Clearance: 21.6 mL/min (A) (by C-G formula based on SCr of 3.95 mg/dL (H)). Liver Function Tests: Recent Labs  Lab 11/17/2019 1031 11/15/19 0021 11/15/19 0610 11/16/19 0418  AST 35 29 29 31   ALT 23 21 20 21   ALKPHOS 164* 140* 151* 154*  BILITOT 1.2 1.1 0.8 0.8  PROT 9.0* 7.7 7.9 7.8  ALBUMIN 3.3* 2.9* 3.0* 3.0*   No results for input(s): LIPASE, AMYLASE in the last 168 hours. No results for input(s): AMMONIA in the last 168 hours. Coagulation Profile: No results for input(s): INR, PROTIME in the last 168 hours. Cardiac Enzymes: No results for input(s): CKTOTAL, CKMB, CKMBINDEX, TROPONINI in the last 168 hours. BNP (last 3 results) No results for input(s): PROBNP in the last 8760 hours. HbA1C: Recent Labs    11/17/19 1204 11/18/19 1101  HGBA1C 8.1* 8.1*   CBG: Recent Labs  Lab 11/18/19 0729 11/18/19 1202 11/18/19  1821 11/19/19 0010 11/19/19 0632  GLUCAP 172* 109* 124* 156* 133*   Lipid Profile: No results for input(s): CHOL, HDL, LDLCALC, TRIG, CHOLHDL, LDLDIRECT in the last 72 hours. Thyroid Function Tests: No results for input(s): TSH, T4TOTAL, FREET4, T3FREE, THYROIDAB in the last 72 hours. Anemia Panel: No results for input(s): VITAMINB12, FOLATE, FERRITIN, TIBC, IRON, RETICCTPCT in the last 72 hours. Sepsis Labs: No results for input(s): PROCALCITON, LATICACIDVEN in the last 168 hours.  Recent Results (from the past 240 hour(s))  Respiratory Panel by RT PCR (Flu A&B, Covid) - Nasopharyngeal Swab     Status: None   Collection Time: 11/13/2019 10:33 AM   Specimen: Nasopharyngeal Swab  Result Value Ref Range Status   SARS Coronavirus 2 by RT PCR NEGATIVE NEGATIVE Final    Comment: (NOTE) SARS-CoV-2 target nucleic acids are NOT DETECTED.  The SARS-CoV-2 RNA is generally detectable in upper respiratoy specimens  during the acute phase of infection. The lowest concentration of SARS-CoV-2 viral copies this assay can detect is 131 copies/mL. A negative result does not preclude SARS-Cov-2 infection and should not be used as the sole basis for treatment or other patient management decisions. A negative result may occur with  improper specimen collection/handling, submission of specimen other than nasopharyngeal swab, presence of viral mutation(s) within the areas targeted by this assay, and inadequate number of viral copies (<131 copies/mL). A negative result must be combined with clinical observations, patient history, and epidemiological information. The expected result is Negative.  Fact Sheet for Patients:  PinkCheek.be  Fact Sheet for Healthcare Providers:  GravelBags.it  This test is no t yet approved or cleared by the [place] FDA and  has been authorized for detection and/or diagnosis of SARS-CoV-2 by FDA under an Emergency Use Authorization (EUA). This EUA will remain  in effect (meaning this test can be used) for the duration of the COVID-19 declaration under Section 564(b)(1) of the Act, 21 U.S.C. section 360bbb-3(b)(1), unless the authorization is terminated or revoked sooner.     Influenza A by PCR NEGATIVE NEGATIVE Final   Influenza B by PCR NEGATIVE NEGATIVE Final    Comment: (NOTE) The Xpert Xpress SARS-CoV-2/FLU/RSV assay is intended as an aid in  the diagnosis of influenza from Nasopharyngeal swab specimens and  should not be used as a sole basis for treatment. Nasal washings and  aspirates are unacceptable for Xpert Xpress SARS-CoV-2/FLU/RSV  testing.  Fact Sheet for Patients: PinkCheek.be  Fact Sheet for Healthcare Providers: GravelBags.it  This test is not yet approved or cleared by the [place] FDA and  has been authorized for detection and/or  diagnosis of SARS-CoV-2 by  FDA under an Emergency Use Authorization (EUA). This EUA will remain  in effect (meaning this test can be used) for the duration of the  Covid-19 declaration under Section 564(b)(1) of the Act, 21  U.S.C. section 360bbb-3(b)(1), unless the authorization is  terminated or revoked. Performed at [facility], [place] [address] [place], [place] [address]          Radiology Studies: IR Fluoro Guide CV Line Right  Result Date: 11/18/2019 INDICATION: 63 year old male with history of acute on chronic kidney injury requiring central venous access for hemodialysis. EXAM: NON-TUNNELED CENTRAL VENOUS HEMODIALYSIS CATHETER PLACEMENT WITH ULTRASOUND AND FLUOROSCOPIC GUIDANCE COMPARISON:  None. MEDICATIONS: None FLUOROSCOPY TIME:  0 minutes, 12 seconds (16 mGy) COMPLICATIONS: None immediate. PROCEDURE: Informed written consent was obtained from the patient after a discussion of the risks, benefits, and alternatives to  treatment. Questions regarding the procedure were encouraged and answered. The right neck and chest were prepped with chlorhexidine in a sterile fashion, and a sterile drape was applied covering the operative field. Maximum barrier sterile technique with sterile gowns and gloves were used for the procedure. A timeout was performed prior to the initiation of the procedure. After the overlying soft tissues were anesthetized, a small venotomy incision was created and a micropuncture kit was utilized to access the internal jugular vein. Real-time ultrasound guidance was utilized for vascular access including the acquisition of a permanent ultrasound image documenting patency of the accessed vessel. A J tipped guidewire was advanced to the level of the IVC. Under fluoroscopic guidance, the venotomy was serially dilated, ultimately allowing placement of a triple-lumen 20 cm temporary catheter with tip ultimately terminating within the superior aspect of the  right atrium. Final catheter positioning was confirmed and documented with a spot radiographic image. The catheter aspirates and flushes normally. The catheter was flushed with appropriate volume heparin dwells. The catheter exit site was secured with a 2-0 prolene retention suture. A dressing was placed. The patient tolerated the procedure well without immediate post procedural complication. IMPRESSION: Successful placement of a right internal jugular approach 20 cm temporary dialysis catheter with tip terminating with in the superior aspect of the right atrium. The catheter is ready for immediate use. PLAN: This catheter may be converted to a tunneled dialysis catheter at a later date as indicated. Ruthann Cancer, MD [facility] Radiology Electronically Signed   By: Ruthann Cancer MD   On: 11/18/2019 15:26   IR US Guide Vasc Access Right  Result Date: 11/18/2019 INDICATION: 64 year old male with history of acute on chronic kidney injury requiring central venous access for hemodialysis. EXAM: NON-TUNNELED CENTRAL VENOUS HEMODIALYSIS CATHETER PLACEMENT WITH ULTRASOUND AND FLUOROSCOPIC GUIDANCE COMPARISON:  None. MEDICATIONS: None FLUOROSCOPY TIME:  0 minutes, 12 seconds (16 mGy) COMPLICATIONS: None immediate. PROCEDURE: Informed written consent was obtained from the patient after a discussion of the risks, benefits, and alternatives to treatment. Questions regarding the procedure were encouraged and answered. The right neck and chest were prepped with chlorhexidine in a sterile fashion, and a sterile drape was applied covering the operative field. Maximum barrier sterile technique with sterile gowns and gloves were used for the procedure. A timeout was performed prior to the initiation of the procedure. After the overlying soft tissues were anesthetized, a small venotomy incision was created and a micropuncture kit was utilized to access the internal jugular vein.  Real-time ultrasound guidance was utilized for vascular access including the acquisition of a permanent ultrasound image documenting patency of the accessed vessel. A J tipped guidewire was advanced to the level of the IVC. Under fluoroscopic guidance, the venotomy was serially dilated, ultimately allowing placement of a triple-lumen 20 cm temporary catheter with tip ultimately terminating within the superior aspect of the right atrium. Final catheter positioning was confirmed and documented with a spot radiographic image. The catheter aspirates and flushes normally. The catheter was flushed with appropriate volume heparin dwells. The catheter exit site was secured with a 2-0 prolene retention suture. A dressing was placed. The patient tolerated the procedure well without immediate post procedural complication. IMPRESSION: Successful placement of a right internal jugular approach 20 cm temporary dialysis catheter with tip terminating with in the superior aspect of the right atrium. The catheter is ready for immediate use. PLAN: This catheter may be converted to a tunneled dialysis catheter at a later  date as indicated. Ruthann Cancer, MD [facility] Radiology Electronically Signed   By: Ruthann Cancer MD   On: 11/18/2019 15:26   DG CHEST PORT 1 VIEW  Result Date: 11/18/2019 CLINICAL DATA:  Central line placement EXAM: PORTABLE CHEST 1 VIEW COMPARISON:  11/18/2019 FINDINGS: There is a new right-sided central venous catheter with tip projecting over the cavoatrial junction. There is no pneumothorax. There is a stable appearance of the right lung field with a probable loculated pleural effusion with adjacent airspace disease. There is a dual chamber pacemaker in place. There is cardiomegaly. IMPRESSION: 1. New right-sided central venous catheter with tip projecting over the cavoatrial junction. No pneumothorax. 2. Otherwise, no significant interval change.  Electronically Signed   By: Constance Holster M.D.   On: 11/18/2019 18:24   DG CHEST PORT 1 VIEW  Result Date: 11/18/2019 CLINICAL DATA:  Shortness of breath EXAM: PORTABLE CHEST 1 VIEW COMPARISON:  11/21/2019 FINDINGS: Unchanged AP portable examination with a loculated appearing right pleural effusion and volume loss of the right hemithorax. The left lung is normally aerated. Cardiomegaly with left chest multi lead pacer. IMPRESSION: 1. Unchanged AP portable examination with a loculated appearing right pleural effusion and volume loss of the right hemithorax. The left lung is normally aerated. No new airspace opacity. 2.  Cardiomegaly. Electronically Signed   By: Eddie Candle M.D.   On: 11/18/2019 08:57        Scheduled Meds: . atorvastatin  10 mg Oral Daily  . bisacodyl  10 mg Oral Daily  . carvedilol  3.125 mg Oral BID WC  . Chlorhexidine Gluconate Cloth  6 each Topical Q0600  . heparin  2,000 Units Dialysis Once in dialysis  . insulin aspart  0-15 Units Subcutaneous TID WC  . isosorbide mononitrate  30 mg Oral Daily  . metolazone  10 mg Oral Daily  . midodrine  10 mg Oral BID WC   Continuous Infusions: . sodium chloride 10 mL/hr at 11/15/19 0306  . furosemide 120 mg (11/19/19 0209)  . heparin 1,650 Units/hr (11/19/19 0610)          Aline August, MD [facility] 11/19/2019, 10:01 AM

## 2019-11-19 NOTE — Progress Notes (Signed)
Renal Navigator received notification from Dr. Jonnie Finner to refer patient for outpatient HD treatment for ESRD.  Navigator met with patient to see how he is feeling and to discuss referral. Patient states he is feeling fine with starting HD at this point, since "I'm not ready to leave yet" (as in not ready to die). He understands his need to start and accepts it at this time. Navigator spoke openly with patient about the need to be honest with himself, his medical team and his supports about how dialysis is going in regards to his quality of life. Patient agreed. Navigator asked how his first treatment went and he said, "I don't know. I haven't done it yet." Navigator is aware that patient had his first treatment last night, but did not talk about this any further.  Navigator asked patient about where he lives and who is supports are. He replied, "no one." He states his sister, Kyle Newton, lives in [place] and that she is a good emotional support person, though she is not here physically. He states he talks with her, "sometimes." Patient states he would want her to make decisions for him if he were not able. He does not have children and in regards to a spouse, he states his spouse is gone. Navigator is unsure what he exactly means by this other than no longer involved. Navigator encouraged patient to talk with his sister about her willingness to make decisions for him if he were ever unable to. Navigator asked if he has her contact number and if he wanted her listed as an Emergency Contact. He states he will speak with her, and that he would like her listed, but does not have her number with him-it's at home. His ER contact is his friend Kyle Newton. He says this person is a good friend and he thinks Kyle Newton would be able to reach his sister if ever needed. Patient states that he lives alone and uses the city bus to get places. He would like to apply for [facility] [place] once transportation to/from outpatient HD  was discussed by Navigator. Renal Navigator explained that outpatient HD treatment takes place 3 times per week and that it is extremely important to attend all sessions. He was able to use teach back method to show understanding. Renal Navigator will ask that Dialysis RN Coordinator meet with patient for further education.  Referral submitted to [facility] and will follow closely for where he get assigned.   Kyle Newton, [place] Renal Navigator [phone]

## 2019-11-19 NOTE — Plan of Care (Signed)
  Problem: Education: Goal: Knowledge of General Education information will improve Description: Including pain rating scale, medication(s)/side effects and non-pharmacologic comfort measures Outcome: Completed/Met   Problem: Clinical Measurements: Goal: Diagnostic test results will improve Outcome: Completed/Met   

## 2019-11-19 NOTE — Consult Note (Signed)
Vascular and Vein Specialist  Patient name: Marko Skalski MRN: [id number] DOB: Aug 14, 1955 Sex: male    HPI: Dyshaun Bonzo is a 64 y.o. male seen in consultation for permanent hemodialysis access.  He is currently on hemodialysis via a right IJ temporary catheter.  He presented with anasarca and renal failure.  Has had session yesterday and is currently having hemodialysis again today.  He is somewhat lethargic but does answer questions appropriately.  I explained plan for permanent access and tunnel catheter tomorrow and he said I am with you.  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  He is right-handed.  Past Medical History:  Diagnosis Date  . CHF (congestive heart failure) ([place])   . Diabetes mellitus without complication ([place])   . Hypertension     Family History  Problem Relation Age of Onset  . Heart failure Mother   . Diabetes Mother   . Diabetes Father     SOCIAL HISTORY: Social History   Tobacco Use  . Smoking status: Current Every Day Smoker    Packs/day: 1.00    Years: 44.00    Pack years: 44.00    Types: Cigarettes  . Smokeless tobacco: Never Used  Substance Use Topics  . Alcohol use: Not Currently    No Known Allergies  Current Facility-Administered Medications  Medication Dose Route Frequency Provider Last Rate Last Admin  . 0.9 %  sodium chloride infusion   Intravenous PRN Toy Baker, MD 10 mL/hr at 11/15/19 0306 New Bag at 11/15/19 0306  . acetaminophen (TYLENOL) tablet 650 mg  650 mg Oral Q6H PRN Marylyn Ishihara, Tyrone A, DO   650 mg at 11/15/19 1811   Or  . acetaminophen (TYLENOL) suppository 650 mg  650 mg Rectal Q6H PRN Marylyn Ishihara, Tyrone A, DO      . atorvastatin (LIPITOR) tablet 10 mg  10 mg Oral Daily Freada Bergeron, MD   10 mg at 11/18/19 0845  . bisacodyl (DULCOLAX) EC tablet 10 mg  10 mg Oral Daily Georgette Shell, MD   10 mg at 11/18/19 0845  .  carvedilol (COREG) tablet 3.125 mg  3.125 mg Oral BID WC Freada Bergeron, MD   3.125 mg at 11/18/19 1636  . [START ON 11/10/2019] ceFAZolin (ANCEF) IVPB 2g/100 mL premix  2 g Intravenous On Call Dshaun Reppucci, Arvilla Meres, MD      . Chlorhexidine Gluconate Cloth 2 % PADS 6 each  6 each Topical Q0600 Roney Jaffe, MD   6 each at 11/19/19 [phone]  . furosemide (LASIX) 120 mg in dextrose 5 % 50 mL IVPB  120 mg Intravenous Q12H Roney Jaffe, MD 62 mL/hr at 11/19/19 0209 120 mg at 11/19/19 0209  . heparin ADULT infusion 100 units/mL (25000 units/265mL sodium chloride 0.45%)  1,550 Units/hr Intravenous Continuous Skeet Simmer, RPH 15.5 mL/hr at 11/19/19 1024 1,550 Units/hr at 11/19/19 1024  . heparin injection 2,000 Units  2,000 Units Dialysis Once in dialysis Roney Jaffe, MD      . insulin aspart (novoLOG) injection 0-15 Units  0-15 Units Subcutaneous TID WC Kyle, Tyrone A, DO   2 Units at 11/19/19 1240  . isosorbide mononitrate (IMDUR) 24 hr tablet 30 mg  30 mg Oral Daily  Freada Bergeron, MD   30 mg at 11/18/19 0845  . metolazone (ZAROXOLYN) tablet 10 mg  10 mg Oral Daily Roney Jaffe, MD   10 mg at 11/18/19 0846  . midodrine (PROAMATINE) tablet 10 mg  10 mg Oral BID WC Roney Jaffe, MD   10 mg at 11/19/19 1312  . ondansetron (ZOFRAN) tablet 4 mg  4 mg Oral Q6H PRN Marylyn Ishihara, Tyrone A, DO       Or  . ondansetron (ZOFRAN) injection 4 mg  4 mg Intravenous Q6H PRN Marylyn Ishihara, Tyrone A, DO        REVIEW OF SYSTEMS:  Reviewed and as history and physical with nothing to add PHYSICAL EXAM: Vitals:   11/18/19 2315 11/19/19 0011 11/19/19 0443 11/19/19 0906  BP: 100/64 100/74 92/80 (!) 136/93  Pulse: (!) 59 62 62 60  Resp: 17   20  Temp: 98 F (36.7 C) 98.4 F (36.9 C) (!) 97.5 F (36.4 C) 98.1 F (36.7 C)  TempSrc: Oral Oral Oral Oral  SpO2: 96% 97% 100% 100%  Weight: 96.1 kg     Height:        GENERAL: The patient is a well-nourished male, in no acute distress. The vital signs are documented  above. CARDIOVASCULAR: 2+ radial pulses bilaterally.  Diffuse anasarca.  I do not see any large surface veins. PULMONARY: There is good air exchange  MUSCULOSKELETAL: There are no major deformities or cyanosis. NEUROLOGIC: No focal weakness or paresthesias are detected. SKIN: There are no ulcers or rashes noted. PSYCHIATRIC: The patient has a normal affect.  DATA:  None  MEDICAL ISSUES: I discussed plan for hemodialysis access with the patient.  He does have a temporary catheter which is currently functioning.  We will place this with a tunneled catheter.  Also explained the need for right arm access.  He is not a candidate for left arm access due to his AICD in his left subclavian vein.  We will proceed without vein map.  Will image his veins with SonoSite ultrasound tomorrow and place fistula versus graft depending on the quality of his veins.  He understands that he will use his temporary catheter for at least 1 month and potentially longer depending on his right arm access.    Rosetta Posner, MD FACS [facility] Tel [phone]

## 2019-11-19 NOTE — Progress Notes (Signed)
[facility] Progress Note  Subjective: UOP better , 3.3 L yest, creat up a bit to 4.55        Vitals:   11/16/19 1321 11/16/19 1728 11/16/19 2036 11/17/19 0559  BP: 116/66 130/79 114/86 114/70  Pulse: 61 64 61 68  Resp: 18  20 18   Temp:   (!) 97.5 F (36.4 C) 97.6 F (36.4 C)  TempSrc:   Oral Axillary  SpO2: 98%  95% 100%  Weight:    97.9 kg  Height:        Exam: Gen awake, interactive +JVD Chest clear bilatto bases, no rales RRR no RG, soft SEM Abd soft ntnd no mass or ascites +bs GU2+ penile/ scrotal edema Extdiffuse LE / abd wall/ hipedema Neuro alert and moving all ext, gen weak   Home meds: - lipitor 10 /imdur 30 qd  CXR -IMPRESSION: 1. Cardiomegaly with a persistent small to moderate-sized right pleural effusion. 2. Right mid and lower lung opacities favored to reflect scarring and atelectasis although superimposed infection is not excluded. ECHO 10/22 - LVEF 35-40%, no RWMA's, akinetic apical segment and also of LV apical lat/ inf-lat/ septal / inf and anterior walls. RV mildly reduced, mod ^PA pressures. Severe LAE, mod RAE. AV replaced/ reparied.  EKG - vent paced rhythm. 80's.   [place] 20192.51 >>1.57AKI episode, eGFR 26 >46 April 20213.04 >>2.30AKI episode, eGFR 21 >29 Oct 21, 86761.9509 Oct 22, 32671.2458  UA negative, prot 100 UNa / UCr pending Renal US - 10.5/ 12.4 cm kidneys, ^'d echo, no hydro, normal appearing bladder  Assessment/ Plan: 1. AoCKD IV - baseline creat from 04/2019 = 2.3- 2.6, eGFR 25- 29 ml/min. Admit creat 3.9 here, stable at 4.1 today. Renal US no sx of obstruction. Severe vol overload w/ anasarca up to lower abd wall, hx known systolic CHF, low EF. Suspect HTN'sive renal failure w/ cardiorenal  syndrome as well. Creat up a bit, will lower IV lasix to bid. Cont diuresis. Pt is now at eGFR of 15 ml/min and is at sig risk of requiring acute dialysis during this hospital stay. Have d/w pt so he is aware and answered questions about dialysis for him.  2. HyperK+ - changed to renal/ carb mod diet, cont lokelma, a little better 5.7 today 3. A/C systolic CHF - LVEF 09-98%, on coreg 4. Perm afib/ flutter - believe on eliquis at home, IV hep here 5. HTN - dc'd hydralazine while diuresing, let BP's run higher during diuresis 6. SP ICD 7. Hx AVR - on IV hep     Rob Teancum Brule 11/17/2019, 8:45 AM

## 2019-11-20 ENCOUNTER — Inpatient Hospital Stay (HOSPITAL_COMMUNITY): Payer: Medicare Other

## 2019-11-20 ENCOUNTER — Encounter (HOSPITAL_COMMUNITY): Admission: EM | Disposition: E | Payer: Self-pay | Source: Home / Self Care | Attending: Internal Medicine

## 2019-11-20 ENCOUNTER — Inpatient Hospital Stay (HOSPITAL_COMMUNITY): Payer: Medicare Other | Admitting: Anesthesiology

## 2019-11-20 ENCOUNTER — Encounter (HOSPITAL_COMMUNITY): Payer: Self-pay | Admitting: Internal Medicine

## 2019-11-20 DIAGNOSIS — I509 Heart failure, unspecified: Secondary | ICD-10-CM | POA: Diagnosis not present

## 2019-11-20 DIAGNOSIS — E875 Hyperkalemia: Secondary | ICD-10-CM | POA: Diagnosis not present

## 2019-11-20 DIAGNOSIS — N179 Acute kidney failure, unspecified: Secondary | ICD-10-CM | POA: Diagnosis not present

## 2019-11-20 HISTORY — PX: INSERTION OF DIALYSIS CATHETER: SHX1324

## 2019-11-20 LAB — CBC
HCT: 30.6 % — ABNORMAL LOW (ref 39.0–52.0)
HCT: 29 % — ABNORMAL LOW (ref 39.0–52.0)
Hemoglobin: 9.6 g/dL — ABNORMAL LOW (ref 13.0–17.0)
Hemoglobin: 9.1 g/dL — ABNORMAL LOW (ref 13.0–17.0)
MCH: 27.3 pg (ref 26.0–34.0)
MCH: 27.9 pg (ref 26.0–34.0)
MCHC: 31.4 g/dL (ref 30.0–36.0)
MCHC: 31.4 g/dL (ref 30.0–36.0)
MCV: 86.9 fL (ref 80.0–100.0)
MCV: 89 fL (ref 80.0–100.0)
Platelets: 81 10*3/uL — ABNORMAL LOW (ref 150–400)
Platelets: 96 10*3/uL — ABNORMAL LOW (ref 150–400)
RBC: 3.52 MIL/uL — ABNORMAL LOW (ref 4.22–5.81)
RBC: 3.26 MIL/uL — ABNORMAL LOW (ref 4.22–5.81)
RDW: 17 % — ABNORMAL HIGH (ref 11.5–15.5)
RDW: 17.2 % — ABNORMAL HIGH (ref 11.5–15.5)
WBC: 5.1 10*3/uL (ref 4.0–10.5)
WBC: 5.9 10*3/uL (ref 4.0–10.5)
nRBC: 0 % (ref 0.0–0.2)
nRBC: 0 % (ref 0.0–0.2)

## 2019-11-20 LAB — GLUCOSE, CAPILLARY
Glucose-Capillary: 93 mg/dL (ref 70–99)
Glucose-Capillary: 111 mg/dL — ABNORMAL HIGH (ref 70–99)
Glucose-Capillary: 168 mg/dL — ABNORMAL HIGH (ref 70–99)
Glucose-Capillary: 262 mg/dL — ABNORMAL HIGH (ref 70–99)

## 2019-11-20 LAB — HEPARIN LEVEL (UNFRACTIONATED)
Heparin Unfractionated: 0.24 IU/mL — ABNORMAL LOW (ref 0.30–0.70)
Heparin Unfractionated: 0.19 IU/mL — ABNORMAL LOW (ref 0.30–0.70)

## 2019-11-20 LAB — BASIC METABOLIC PANEL
Anion gap: 8 (ref 5–15)
Anion gap: 8 (ref 5–15)
BUN: 54 mg/dL — ABNORMAL HIGH (ref 8–23)
BUN: 56 mg/dL — ABNORMAL HIGH (ref 8–23)
CO2: 23 mmol/L (ref 22–32)
CO2: 23 mmol/L (ref 22–32)
Calcium: 8.1 mg/dL — ABNORMAL LOW (ref 8.9–10.3)
Calcium: 7.9 mg/dL — ABNORMAL LOW (ref 8.9–10.3)
Chloride: 104 mmol/L (ref 98–111)
Chloride: 105 mmol/L (ref 98–111)
Creatinine, Ser: 3.24 mg/dL — ABNORMAL HIGH (ref 0.61–1.24)
Creatinine, Ser: 3.25 mg/dL — ABNORMAL HIGH (ref 0.61–1.24)
GFR, Estimated: 21 mL/min — ABNORMAL LOW (ref >60)
GFR, Estimated: 20 mL/min — ABNORMAL LOW (ref >60)
Glucose, Bld: 164 mg/dL — ABNORMAL HIGH (ref 70–99)
Glucose, Bld: 121 mg/dL — ABNORMAL HIGH (ref 70–99)
Potassium: 4.3 mmol/L (ref 3.5–5.1)
Potassium: 4.4 mmol/L (ref 3.5–5.1)
Sodium: 135 mmol/L (ref 135–145)
Sodium: 136 mmol/L (ref 135–145)

## 2019-11-20 LAB — IRON AND TIBC
Iron: 35 ug/dL — ABNORMAL LOW (ref 45–182)
Saturation Ratios: 12 % — ABNORMAL LOW (ref 17.9–39.5)
TIBC: 288 ug/dL (ref 250–450)
UIBC: 253 ug/dL

## 2019-11-20 LAB — BLOOD GAS, ARTERIAL
Acid-base deficit: 0.5 mmol/L (ref 0.0–2.0)
Bicarbonate: 24.3 mmol/L (ref 20.0–28.0)
Drawn by: 28603
FIO2: 21
O2 Saturation: 92 %
Patient temperature: 36.8
pCO2 arterial: 43.8 mmHg (ref 32.0–48.0)
pH, Arterial: 7.361 (ref 7.350–7.450)
pO2, Arterial: 66.1 mmHg — ABNORMAL LOW (ref 83.0–108.0)

## 2019-11-20 LAB — PHOSPHORUS: Phosphorus: 2.9 mg/dL (ref 2.5–4.6)

## 2019-11-20 LAB — MRSA PCR SCREENING: MRSA by PCR: NEGATIVE

## 2019-11-20 LAB — TROPONIN I (HIGH SENSITIVITY): Troponin I (High Sensitivity): 61 ng/L — ABNORMAL HIGH (ref <18)

## 2019-11-20 LAB — FERRITIN: Ferritin: 6 ng/mL — ABNORMAL LOW (ref 24–336)

## 2019-11-20 SURGERY — INSERTION OF DIALYSIS CATHETER
Anesthesia: General | Site: Neck | Laterality: Right

## 2019-11-20 MED ORDER — LIDOCAINE 2% (20 MG/ML) 5 ML SYRINGE
INTRAMUSCULAR | Status: AC
  Filled 2019-11-20: qty 5

## 2019-11-20 MED ORDER — PHENYLEPHRINE 40 MCG/ML (10ML) SYRINGE FOR IV PUSH (FOR BLOOD PRESSURE SUPPORT)
PREFILLED_SYRINGE | INTRAVENOUS | Status: AC
  Filled 2019-11-20: qty 10

## 2019-11-20 MED ORDER — LIDOCAINE-EPINEPHRINE 0.5 %-1:200000 IJ SOLN
INTRAMUSCULAR | Status: AC
  Filled 2019-11-20: qty 1

## 2019-11-20 MED ORDER — SODIUM CHLORIDE 0.9 % IV SOLN
INTRAVENOUS | Status: AC
  Filled 2019-11-20: qty 1.2

## 2019-11-20 MED ORDER — VASOPRESSIN 20 UNIT/ML IV SOLN
INTRAVENOUS | Status: DC | PRN
  Administered 2019-11-20: 2 [IU] via INTRAVENOUS
  Administered 2019-11-20: 1 [IU] via INTRAVENOUS

## 2019-11-20 MED ORDER — PROPOFOL 10 MG/ML IV BOLUS
INTRAVENOUS | Status: DC | PRN
  Administered 2019-11-20: 100 mg via INTRAVENOUS

## 2019-11-20 MED ORDER — HEPARIN SODIUM (PORCINE) 1000 UNIT/ML IJ SOLN
INTRAMUSCULAR | Status: DC | PRN
  Administered 2019-11-20: 3800 [IU] via INTRAVENOUS

## 2019-11-20 MED ORDER — PROPOFOL 10 MG/ML IV BOLUS
INTRAVENOUS | Status: AC
  Filled 2019-11-20: qty 20

## 2019-11-20 MED ORDER — LIDOCAINE 2% (20 MG/ML) 5 ML SYRINGE
INTRAMUSCULAR | Status: DC | PRN
  Administered 2019-11-20: 40 mg via INTRAVENOUS

## 2019-11-20 MED ORDER — ALBUMIN HUMAN 5 % IV SOLN: INTRAVENOUS | Status: DC | PRN

## 2019-11-20 MED ORDER — FENTANYL CITRATE (PF) 100 MCG/2ML IJ SOLN
INTRAMUSCULAR | Status: DC | PRN
  Administered 2019-11-20: 50 ug via INTRAVENOUS

## 2019-11-20 MED ORDER — SODIUM CHLORIDE 0.9 % IV SOLN
INTRAVENOUS | Status: DC | PRN
  Administered 2019-11-20: 09:00:00 500 mL

## 2019-11-20 MED ORDER — FENTANYL CITRATE (PF) 100 MCG/2ML IJ SOLN: 25.0000 ug | INTRAMUSCULAR | Status: DC | PRN

## 2019-11-20 MED ORDER — NOREPINEPHRINE 4 MG/250ML-% IV SOLN
0.0000 ug/min | INTRAVENOUS | Status: AC
  Administered 2019-11-20: 4 ug/min via INTRAVENOUS
  Filled 2019-11-20: qty 250

## 2019-11-20 MED ORDER — MIDAZOLAM HCL 2 MG/2ML IJ SOLN
INTRAMUSCULAR | Status: AC
  Filled 2019-11-20: qty 2

## 2019-11-20 MED ORDER — PROSOURCE PLUS PO LIQD
30.0000 mL | Freq: Two times a day (BID) | ORAL | Status: DC
  Administered 2019-11-21: 30 mL via ORAL
  Filled 2019-11-20: qty 30

## 2019-11-20 MED ORDER — SODIUM CHLORIDE 0.9 % IV SOLN: INTRAVENOUS | Status: DC | PRN

## 2019-11-20 MED ORDER — EPINEPHRINE PF 1 MG/ML IJ SOLN
INTRAMUSCULAR | Status: DC | PRN
  Administered 2019-11-20: .1 mg via INTRAVENOUS

## 2019-11-20 MED ORDER — DEXAMETHASONE SODIUM PHOSPHATE 10 MG/ML IJ SOLN
INTRAMUSCULAR | Status: AC
  Filled 2019-11-20: qty 1

## 2019-11-20 MED ORDER — LIDOCAINE-EPINEPHRINE 0.5 %-1:200000 IJ SOLN: INTRAMUSCULAR | Status: DC | PRN

## 2019-11-20 MED ORDER — FENTANYL CITRATE (PF) 250 MCG/5ML IJ SOLN
INTRAMUSCULAR | Status: AC
  Filled 2019-11-20: qty 5

## 2019-11-20 MED ORDER — SUCCINYLCHOLINE CHLORIDE 200 MG/10ML IV SOSY
PREFILLED_SYRINGE | INTRAVENOUS | Status: AC
  Filled 2019-11-20: qty 10

## 2019-11-20 MED ORDER — HEPARIN SODIUM (PORCINE) 1000 UNIT/ML IJ SOLN
INTRAMUSCULAR | Status: AC
  Administered 2019-11-20: 1000 [IU]
  Filled 2019-11-20: qty 3

## 2019-11-20 MED ORDER — PHENYLEPHRINE HCL-NACL 10-0.9 MG/250ML-% IV SOLN
INTRAVENOUS | Status: DC | PRN
  Administered 2019-11-20: 40 ug/min via INTRAVENOUS

## 2019-11-20 MED ORDER — CEFAZOLIN SODIUM 1 G IJ SOLR
INTRAMUSCULAR | Status: AC
  Filled 2019-11-20: qty 20

## 2019-11-20 MED ORDER — ONDANSETRON HCL 4 MG/2ML IJ SOLN
INTRAMUSCULAR | Status: AC
  Filled 2019-11-20: qty 2

## 2019-11-20 MED ORDER — HEPARIN SODIUM (PORCINE) 1000 UNIT/ML IJ SOLN
INTRAMUSCULAR | Status: AC
  Filled 2019-11-20: qty 1

## 2019-11-20 MED ORDER — ROCURONIUM BROMIDE 10 MG/ML (PF) SYRINGE
PREFILLED_SYRINGE | INTRAVENOUS | Status: AC
  Filled 2019-11-20: qty 10

## 2019-11-20 MED ORDER — VASOPRESSIN 20 UNIT/ML IV SOLN
INTRAVENOUS | Status: AC
  Filled 2019-11-20: qty 1

## 2019-11-20 MED ORDER — PHENYLEPHRINE 40 MCG/ML (10ML) SYRINGE FOR IV PUSH (FOR BLOOD PRESSURE SUPPORT)
PREFILLED_SYRINGE | INTRAVENOUS | Status: DC | PRN
  Administered 2019-11-20 (×2): 120 ug via INTRAVENOUS

## 2019-11-20 MED ORDER — ONDANSETRON HCL 4 MG/2ML IJ SOLN
4.0000 mg | Freq: Once | INTRAMUSCULAR | Status: AC | PRN
  Administered 2019-11-20: 4 mg via INTRAVENOUS

## 2019-11-20 MED ORDER — 0.9 % SODIUM CHLORIDE (POUR BTL) OPTIME
TOPICAL | Status: DC | PRN
  Administered 2019-11-20: 1000 mL

## 2019-11-20 MED ORDER — HEPARIN SODIUM (PORCINE) 1000 UNIT/ML DIALYSIS: 2500.0000 [IU] | INTRAMUSCULAR | Status: DC | PRN

## 2019-11-20 MED ORDER — EPHEDRINE 5 MG/ML INJ
INTRAVENOUS | Status: AC
  Filled 2019-11-20: qty 10

## 2019-11-20 SURGICAL SUPPLY — 52 items
ARMBAND PINK RESTRICT EXTREMIT (MISCELLANEOUS) ×8 IMPLANT
BAG DECANTER FOR FLEXI CONT (MISCELLANEOUS) ×4 IMPLANT
BIOPATCH RED 1 DISK 7.0 (GAUZE/BANDAGES/DRESSINGS) ×3 IMPLANT
BIOPATCH RED 1IN DISK 7.0MM (GAUZE/BANDAGES/DRESSINGS) ×1
CANISTER SUCT 3000ML PPV (MISCELLANEOUS) ×4 IMPLANT
CANNULA VESSEL 3MM 2 BLNT TIP (CANNULA) ×4 IMPLANT
CATH PALINDROME-P 19CM W/VT (CATHETERS) IMPLANT
CATH PALINDROME-P 23CM W/VT (CATHETERS) ×4 IMPLANT
CATH PALINDROME-P 28CM W/VT (CATHETERS) IMPLANT
CLIP LIGATING EXTRA MED SLVR (CLIP) ×4 IMPLANT
CLIP LIGATING EXTRA SM BLUE (MISCELLANEOUS) ×4 IMPLANT
COVER PROBE W GEL 5X96 (DRAPES) ×4 IMPLANT
COVER SURGICAL LIGHT HANDLE (MISCELLANEOUS) ×4 IMPLANT
COVER WAND RF STERILE (DRAPES) IMPLANT
DECANTER SPIKE VIAL GLASS SM (MISCELLANEOUS) ×4 IMPLANT
DERMABOND ADVANCED (GAUZE/BANDAGES/DRESSINGS) ×2
DERMABOND ADVANCED .7 DNX12 (GAUZE/BANDAGES/DRESSINGS) ×2 IMPLANT
DRAPE C-ARM 42X72 X-RAY (DRAPES) ×4 IMPLANT
DRAPE CHEST BREAST 15X10 FENES (DRAPES) ×4 IMPLANT
ELECT REM PT RETURN 9FT ADLT (ELECTROSURGICAL) ×4
ELECTRODE REM PT RTRN 9FT ADLT (ELECTROSURGICAL) ×2 IMPLANT
GAUZE 4X4 16PLY RFD (DISPOSABLE) ×4 IMPLANT
GAUZE SPONGE 4X4 12PLY STRL (GAUZE/BANDAGES/DRESSINGS) ×4 IMPLANT
GLOVE SS BIOGEL STRL SZ 7.5 (GLOVE) ×2 IMPLANT
GLOVE SUPERSENSE BIOGEL SZ 7.5 (GLOVE) ×2
GOWN STRL REUS W/ TWL LRG LVL3 (GOWN DISPOSABLE) ×6 IMPLANT
GOWN STRL REUS W/TWL LRG LVL3 (GOWN DISPOSABLE) ×6
KIT BASIN OR (CUSTOM PROCEDURE TRAY) ×4 IMPLANT
KIT PALINDROME-P 55CM (CATHETERS) IMPLANT
KIT TURNOVER KIT B (KITS) ×4 IMPLANT
NEEDLE 18GX1X1/2 (RX/OR ONLY) (NEEDLE) ×4 IMPLANT
NEEDLE 22X1 1/2 (OR ONLY) (NEEDLE) IMPLANT
NEEDLE HYPO 25GX1X1/2 BEV (NEEDLE) ×4 IMPLANT
NS IRRIG 1000ML POUR BTL (IV SOLUTION) ×4 IMPLANT
PACK CV ACCESS (CUSTOM PROCEDURE TRAY) ×4 IMPLANT
PACK SURGICAL SETUP 50X90 (CUSTOM PROCEDURE TRAY) ×4 IMPLANT
PAD ARMBOARD 7.5X6 YLW CONV (MISCELLANEOUS) ×8 IMPLANT
SOAP 2 % CHG 4 OZ (WOUND CARE) ×4 IMPLANT
SUT ETHILON 3 0 PS 1 (SUTURE) ×4 IMPLANT
SUT PROLENE 6 0 CC (SUTURE) ×4 IMPLANT
SUT VIC AB 3-0 SH 27 (SUTURE) ×4
SUT VIC AB 3-0 SH 27X BRD (SUTURE) ×4 IMPLANT
SUT VICRYL 4-0 PS2 18IN ABS (SUTURE) ×8 IMPLANT
SYR 10ML LL (SYRINGE) ×4 IMPLANT
SYR 20ML LL LF (SYRINGE) ×4 IMPLANT
SYR 5ML LL (SYRINGE) ×8 IMPLANT
SYR CONTROL 10ML LL (SYRINGE) ×4 IMPLANT
TAPE CLOTH 4X10 WHT NS (GAUZE/BANDAGES/DRESSINGS) ×4 IMPLANT
TOWEL GREEN STERILE (TOWEL DISPOSABLE) ×8 IMPLANT
TOWEL GREEN STERILE FF (TOWEL DISPOSABLE) ×4 IMPLANT
UNDERPAD 30X36 HEAVY ABSORB (UNDERPADS AND DIAPERS) ×4 IMPLANT
WATER STERILE IRR 1000ML POUR (IV SOLUTION) ×4 IMPLANT

## 2019-11-20 NOTE — Progress Notes (Signed)
OT Cancellation Note  Patient Details Name: Kyle Newton MRN: [id number] DOB: 11-14-1955   Cancelled Treatment:    Reason Eval/Treat Not Completed: Patient at procedure or test/ unavailable--will follow and see as able.   Jolaine Artist, OT Acute Rehabilitation Services Pager [phone] Office [phone]   Kyle Newton 10/26/2019, 9:02 AM

## 2019-11-20 NOTE — Anesthesia Postprocedure Evaluation (Signed)
Anesthesia Post Note  Patient: Kyle Newton  Procedure(s) Performed: INSERTION OF TUNNELED DIALYSIS CATHETER (Right Neck)     Patient location during evaluation: PACU Anesthesia Type: General Level of consciousness: awake, lethargic and patient cooperative Pain management: pain level controlled Vital Signs Assessment: post-procedure vital signs reviewed and stable Respiratory status: spontaneous breathing, respiratory function stable and nonlabored ventilation Cardiovascular status: blood pressure returned to baseline Anesthetic complications: no Comments: See progress notes    No complications documented.  Last Vitals:  Vitals:   11/02/2019 1115 11/02/2019 1153  BP:  109/71  Pulse: (!) 58 (!) 59  Resp: (!) 6 18  Temp:  (!) 36.3 C  SpO2: 100% 96%    Last Pain:  Vitals:   11/16/2019 1153  TempSrc: Oral  PainSc:                  [place] COKER

## 2019-11-20 NOTE — Anesthesia Procedure Notes (Signed)
Procedure Name: LMA Insertion Date/Time: 11/17/2019 7:43 AM Performed by: Devin Ganaway T, CRNA Pre-anesthesia Checklist: Patient identified, Emergency Drugs available, Suction available and Patient being monitored Patient Re-evaluated:Patient Re-evaluated prior to induction Oxygen Delivery Method: Circle system utilized Preoxygenation: Pre-oxygenation with 100% oxygen Induction Type: IV induction Ventilation: Mask ventilation without difficulty LMA: LMA inserted LMA Size: 5.0 Number of attempts: 1 Placement Confirmation: positive ETCO2 and breath sounds checked- equal and bilateral Tube secured with: Tape Dental Injury: Teeth and Oropharynx as per pre-operative assessment

## 2019-11-20 NOTE — Progress Notes (Signed)
Anesthesiology note:   Kyle Newton is a 64 year old male with end-stage renal disease, cardiomyopathy, Status post bioprosthetic aortic valve replacement, persistent a flutter, AICD, hypertension, and type 2 diabetes:   He was admitted to [facility] on 11/14/29 with anasarca and renal failure. He was started on dialysis through a temporary catheter.  He was brought to the operating room today for placement of a permanent dialysis catheter and right arm AV Gore-Tex graft or fistula.     In the preoperative holding area,  he was moderately confused but  answered simple questions and was moving all his extremities. He was brought to the operating room and general anesthesia was induced with 100 mg propofol and an LMA was inserted.   His blood pressure dropped to 90 systolic from 997/74 prior to induction.  He was treated with 250 cc of 5% albumin and Neo-Synephrine with no response and his blood pressure progressively decreased to 14-23 systolic.  This happened over approximately 10-15 minutes.  I was called to the room and the patient's blood pressure was 55/30.  I could not palpate a pulse but there was an end-tidal CO2 waveform of approximately 30 mmHg.  He was  started on Levophed at 5 mics per minute and given 50 mcg of epinephrine.  No chest compressions were started. An arterial line was inserted and his blood pressure  was  150/80 approximately 3 to 4 minutes after he received the epinephrine and 5 minutes after the Levophed was begun.   The decision was made to cancel the surgery since he has  adequate dialysis access.  He  then emerged from anesthesia and the LMA was removed.  He was moving all extremities and his blood pressure remained stable. His mental status appeared to return to baseline in recovery.  The pressors have now been weaned off.   Vital signs: T-36.8 BP 128/62 HR 60 RR-21 O2 Sat 98% on 2L O2  Heart- RRR with systolic murmur Lungs- upper airway BS no crackles  ECG Paced rhythm 60  bpm with a. Flutter waves  Impression: Persistent hypotension following induction of anesthesia in 64 year old male with multiple comorbidities.  Plan: 1. Check Troponin, BMET, ABG, CBC 2. Transfer to monitored floor 3. Dr. Broadus John ([facility] will see patient) 4. Observe in PACU for at least one hour prior to transfer.  Roberts Gaudy

## 2019-11-20 NOTE — Progress Notes (Signed)
Patient ID: Taheem Fricke, male   DOB: December 11, 1955, 64 y.o.   MRN: [id number]  PROGRESS NOTE   Aaronmichael Brumbaugh  GMW:[id number] DOB: [birth year] DOA: 11/24/2019 PCP: Patient, No Pcp Per   Brief Narrative:  63/M history of chronic systolic heart failure, EF of 25%, diabetes mellitus type 2, aortic valve replacement and AICD, chronic atrial fibrillation status post AV nodal ablation, chronic renal disease stage IV, diabetes mellitus type 2,  noncompliant to medications and follow-up presented with generalized edema, scrotal edema and dyspnea on exertion.  Patient was admitted for acute on chronic systolic CHF exacerbation.  Cardiology was consulted.  He was diuresed.  Renal function worsened.  Nephrology recommended transfer to [place] for initiation of hemodialysis.  He was transferred to [facility] on 11/18/2019 and was started on hemodialysis on 11/18/2019.  Assessment & Plan:   Acute on chronic systolic heart failure AKI on CKD stage IV New ESRD Admitted with anasarca, scrotal edema, acute kidney injury -baseline creatinine around 2.5, was 3.9 on admission and then trended up to the 4.6 range -Echo noted EF of 35 to 40% -Followed by cardiology initially, diuresed aggressively with Lasix drip and metolazone with limited response, eventually with worsening kidney function nephrology was consulted, transferred to [facility] and started hemodialysis on 10/25 -Volume managed with HD now -Also has cognitive deficits with long history of poor compliance -Severe hypotension with brief loss of pulse after tunneled HD cath placement in the OR today,, did not require CPR, temporarily needed Neo-Synephrine  Hyperkalemia -Received Lokelma earlier this admission  Essential hypertension -Blood pressure has been trending down this admission, now on midodrine, also on low-dose carvedilol -Today with profound hypotensive episode with blood pressure dropping to the 50s after HD cath  placement, transiently requiring pressors, now improved -Stopped Imdur  Diabetes mellitus type 2 -A1c 8.1.  Continue CBGs with SSI.  Patient was not compliant with glipizide as an outpatient -Carb modified diet  History of polysubstance abuse -Has history of marijuana and cocaine abuse.  Urine drug screen positive for cocaine.  Social worker consult  History of aortic valve replacement Chronic atrial fibrillation/flutter -Eliquis on hold.  Currently on heparin drip.  Currently rate controlled.  Continue Coreg  Hyperlipidemia -Continue statin  Generalized conditioning -Overall prognosis is guarded to poor given patient's noncompliance and polysubstance abuse.  Palliative care has evaluated the patient during this hospitalization and patient remains full code with full scope of treatment -Disposition remains unclear, ideally needs SNF  DVT prophylaxis: Heparin drip Code Status: Full Family Communication: None at bedside Disposition Plan: Status is: Inpatient  Remains inpatient appropriate because:Inpatient level of care appropriate due to severity of illness   Dispo: The patient is from: Home              Anticipated d/c is to: Ideally needs SNF              Anticipated d/c date is: > 3 days              Patient currently is not medically stable to d/c.  Consultants: Cardiology/nephrology/palliative care/IR  Procedures:  Echo  nontunneled hemodialysis catheter placement by IR on 11/18/2019 Hemodialysis started on 11/18/2019  Antimicrobials: None  Subjective: -Just ate lunch and wants to sleep, does not want to be bothered  Objective: Vitals:   10/29/2019 1115 11/11/2019 1153 11/24/2019 1403 10/31/2019 1408  BP:  109/71  (!) 95/56  Pulse: (!) 58 (!) 59  62  Resp: (!) 6 18  18  Temp:  (!) 97.4 F (36.3 C)  (!) 97.5 F (36.4 C)  TempSrc:  Oral  Axillary  SpO2: 100% 96%  93%  Weight:   92.7 kg   Height:        Intake/Output Summary (Last 24 hours) at 10/25/2019  1454 Last data filed at 10/29/2019 4540 Gross per 24 hour  Intake 1943.65 ml  Output 2420 ml  Net -476.35 ml   Filed Weights   11/19/19 1655 11/19/19 2041 10/30/2019 1403  Weight: 94.4 kg 94.4 kg 92.7 kg    Examination:  General exam: Chronically ill elderly male resting in bed, somnolent but easily arousable, oriented to self and place, only partly to time, cognitive deficits noted CVS: S1-S2, regular rate rhythm Lungs: Diminished breath sounds to bases, otherwise clear Abdomen: Soft, mildly distended, nontender, bowel sounds present Extremities: Trace edema Neuro: Awake and alert, cognitive deficits, moves all extremities no localizing signs  Skin: Multiple healed lesions on skin noted all over the body     Data Reviewed: I have personally reviewed following labs and imaging studies  CBC: Recent Labs  Lab 11/13/2019 1031 11/15/19 0021 11/17/19 0414 11/18/19 0421 11/19/19 0346 11/23/2019 0229 11/10/2019 0943  WBC 4.4   < > 3.9* 4.0 4.7 5.1 5.9  NEUTROABS 2.7  --   --   --   --   --   --   HGB 13.7   < > 12.7* 12.6* 10.7* 9.6* 9.1*  HCT 43.8   < > 39.7 39.5 33.3* 30.6* 29.0*  MCV 90.9   < > 88.8 87.8 86.3 86.9 89.0  PLT 166   < > 179 167 104* 81* 96*   < > = values in this interval not displayed.   Basic Metabolic Panel: Recent Labs  Lab 11/17/19 0414 11/18/19 0421 11/19/19 0346 11/07/2019 0229 11/04/2019 0943  NA 137 138 137 135 136  K 5.7* 5.5* 4.3 4.3 4.4  CL 109 107 105 104 105  CO2 17* 18* 22 23 23   GLUCOSE 131* 135* 152* 164* 121*  BUN 89* 93* 74* 54* 56*  CREATININE 4.55* 4.64* 3.95* 3.24* 3.25*  CALCIUM 8.7* 8.8* 8.3* 8.1* 7.9*   GFR: Estimated Creatinine Clearance: 25.8 mL/min (A) (by C-G formula based on SCr of 3.25 mg/dL (H)). Liver Function Tests: Recent Labs  Lab 10/28/2019 1031 11/15/19 0021 11/15/19 0610 11/16/19 0418  AST 35 29 29 31   ALT 23 21 20 21   ALKPHOS 164* 140* 151* 154*  BILITOT 1.2 1.1 0.8 0.8  PROT 9.0* 7.7 7.9 7.8  ALBUMIN 3.3*  2.9* 3.0* 3.0*   No results for input(s): LIPASE, AMYLASE in the last 168 hours. No results for input(s): AMMONIA in the last 168 hours. Coagulation Profile: No results for input(s): INR, PROTIME in the last 168 hours. Cardiac Enzymes: No results for input(s): CKTOTAL, CKMB, CKMBINDEX, TROPONINI in the last 168 hours. BNP (last 3 results) No results for input(s): PROBNP in the last 8760 hours. HbA1C: Recent Labs    11/18/19 1101  HGBA1C 8.1*   CBG: Recent Labs  Lab 11/19/19 1124 11/19/19 1737 11/19/19 2156 11/06/2019 0917 11/10/2019 1155  GLUCAP 135* 102* 147* 93 111*   Lipid Profile: No results for input(s): CHOL, HDL, LDLCALC, TRIG, CHOLHDL, LDLDIRECT in the last 72 hours. Thyroid Function Tests: No results for input(s): TSH, T4TOTAL, FREET4, T3FREE, THYROIDAB in the last 72 hours. Anemia Panel: No results for input(s): VITAMINB12, FOLATE, FERRITIN, TIBC, IRON, RETICCTPCT in the last 72 hours. Sepsis Labs:  No results for input(s): PROCALCITON, LATICACIDVEN in the last 168 hours.  Recent Results (from the past 240 hour(s))  Respiratory Panel by RT PCR (Flu A&B, Covid) - Nasopharyngeal Swab     Status: None   Collection Time: 11/21/2019 10:33 AM   Specimen: Nasopharyngeal Swab  Result Value Ref Range Status   SARS Coronavirus 2 by RT PCR NEGATIVE NEGATIVE Final    Comment: (NOTE) SARS-CoV-2 target nucleic acids are NOT DETECTED.  The SARS-CoV-2 RNA is generally detectable in upper respiratoy specimens during the acute phase of infection. The lowest concentration of SARS-CoV-2 viral copies this assay can detect is 131 copies/mL. A negative result does not preclude SARS-Cov-2 infection and should not be used as the sole basis for treatment or other patient management decisions. A negative result may occur with  improper specimen collection/handling, submission of specimen other than nasopharyngeal swab, presence of viral mutation(s) within the areas targeted by this assay,  and inadequate number of viral copies (<131 copies/mL). A negative result must be combined with clinical observations, patient history, and epidemiological information. The expected result is Negative.  Fact Sheet for Patients:  PinkCheek.be  Fact Sheet for Healthcare Providers:  GravelBags.it  This test is no t yet approved or cleared by the [place] FDA and  has been authorized for detection and/or diagnosis of SARS-CoV-2 by FDA under an Emergency Use Authorization (EUA). This EUA will remain  in effect (meaning this test can be used) for the duration of the COVID-19 declaration under Section 564(b)(1) of the Act, 21 U.S.C. section 360bbb-3(b)(1), unless the authorization is terminated or revoked sooner.     Influenza A by PCR NEGATIVE NEGATIVE Final   Influenza B by PCR NEGATIVE NEGATIVE Final    Comment: (NOTE) The Xpert Xpress SARS-CoV-2/FLU/RSV assay is intended as an aid in  the diagnosis of influenza from Nasopharyngeal swab specimens and  should not be used as a sole basis for treatment. Nasal washings and  aspirates are unacceptable for Xpert Xpress SARS-CoV-2/FLU/RSV  testing.  Fact Sheet for Patients: PinkCheek.be  Fact Sheet for Healthcare Providers: GravelBags.it  This test is not yet approved or cleared by the [place] FDA and  has been authorized for detection and/or diagnosis of SARS-CoV-2 by  FDA under an Emergency Use Authorization (EUA). This EUA will remain  in effect (meaning this test can be used) for the duration of the  Covid-19 declaration under Section 564(b)(1) of the Act, 21  U.S.C. section 360bbb-3(b)(1), unless the authorization is  terminated or revoked. Performed at [facility], [place] [address], [place], [place] [address]   MRSA PCR Screening     Status: None   Collection Time: 11/12/2019  3:38 AM    Specimen: Nasal Mucosa; Nasopharyngeal  Result Value Ref Range Status   MRSA by PCR NEGATIVE NEGATIVE Final    Comment:        The GeneXpert MRSA Assay (FDA approved for NASAL specimens only), is one component of a comprehensive MRSA colonization surveillance program. It is not intended to diagnose MRSA infection nor to guide or monitor treatment for MRSA infections. Performed at [facility], [place] [address] [place], [place] [address]          Radiology Studies: IR Fluoro Guide CV Line Right  Result Date: 11/18/2019 INDICATION: 64 year old male with history of acute on chronic kidney injury requiring central venous access for hemodialysis. EXAM: NON-TUNNELED CENTRAL VENOUS HEMODIALYSIS CATHETER PLACEMENT WITH ULTRASOUND AND FLUOROSCOPIC GUIDANCE COMPARISON:  None.  MEDICATIONS: None FLUOROSCOPY TIME:  0 minutes, 12 seconds (16 mGy) COMPLICATIONS: None immediate. PROCEDURE: Informed written consent was obtained from the patient after a discussion of the risks, benefits, and alternatives to treatment. Questions regarding the procedure were encouraged and answered. The right neck and chest were prepped with chlorhexidine in a sterile fashion, and a sterile drape was applied covering the operative field. Maximum barrier sterile technique with sterile gowns and gloves were used for the procedure. A timeout was performed prior to the initiation of the procedure. After the overlying soft tissues were anesthetized, a small venotomy incision was created and a micropuncture kit was utilized to access the internal jugular vein. Real-time ultrasound guidance was utilized for vascular access including the acquisition of a permanent ultrasound image documenting patency of the accessed vessel. A J tipped guidewire was advanced to the level of the IVC. Under fluoroscopic guidance, the venotomy was serially dilated, ultimately allowing placement of a triple-lumen 20 cm temporary catheter with tip  ultimately terminating within the superior aspect of the right atrium. Final catheter positioning was confirmed and documented with a spot radiographic image. The catheter aspirates and flushes normally. The catheter was flushed with appropriate volume heparin dwells. The catheter exit site was secured with a 2-0 prolene retention suture. A dressing was placed. The patient tolerated the procedure well without immediate post procedural complication. IMPRESSION: Successful placement of a right internal jugular approach 20 cm temporary dialysis catheter with tip terminating with in the superior aspect of the right atrium. The catheter is ready for immediate use. PLAN: This catheter may be converted to a tunneled dialysis catheter at a later date as indicated. Ruthann Cancer, MD [facility] Radiology Electronically Signed   By: Ruthann Cancer MD   On: 11/18/2019 15:26   IR US Guide Vasc Access Right  Result Date: 11/18/2019 INDICATION: 64 year old male with history of acute on chronic kidney injury requiring central venous access for hemodialysis. EXAM: NON-TUNNELED CENTRAL VENOUS HEMODIALYSIS CATHETER PLACEMENT WITH ULTRASOUND AND FLUOROSCOPIC GUIDANCE COMPARISON:  None. MEDICATIONS: None FLUOROSCOPY TIME:  0 minutes, 12 seconds (16 mGy) COMPLICATIONS: None immediate. PROCEDURE: Informed written consent was obtained from the patient after a discussion of the risks, benefits, and alternatives to treatment. Questions regarding the procedure were encouraged and answered. The right neck and chest were prepped with chlorhexidine in a sterile fashion, and a sterile drape was applied covering the operative field. Maximum barrier sterile technique with sterile gowns and gloves were used for the procedure. A timeout was performed prior to the initiation of the procedure. After the overlying soft tissues were anesthetized, a small venotomy incision was created and a  micropuncture kit was utilized to access the internal jugular vein. Real-time ultrasound guidance was utilized for vascular access including the acquisition of a permanent ultrasound image documenting patency of the accessed vessel. A J tipped guidewire was advanced to the level of the IVC. Under fluoroscopic guidance, the venotomy was serially dilated, ultimately allowing placement of a triple-lumen 20 cm temporary catheter with tip ultimately terminating within the superior aspect of the right atrium. Final catheter positioning was confirmed and documented with a spot radiographic image. The catheter aspirates and flushes normally. The catheter was flushed with appropriate volume heparin dwells. The catheter exit site was secured with a 2-0 prolene retention suture. A dressing was placed. The patient tolerated the procedure well without immediate post procedural complication. IMPRESSION: Successful placement of a right internal jugular approach 20 cm temporary dialysis catheter  with tip terminating with in the superior aspect of the right atrium. The catheter is ready for immediate use. PLAN: This catheter may be converted to a tunneled dialysis catheter at a later date as indicated. Ruthann Cancer, MD [facility] Radiology Electronically Signed   By: Ruthann Cancer MD   On: 11/18/2019 15:26   DG Chest Port 1 View  Result Date: 10/25/2019 CLINICAL DATA:  Status post dialysis catheter insertion. EXAM: PORTABLE CHEST 1 VIEW COMPARISON:  11/18/2019 FINDINGS: Interval placement of right sided dialysis catheter with tips in the right atrium. No pneumothorax identified. Previous median sternotomy and cardiac valve replacement. There is a left chest wall pacer device with leads in the right atrial appendage and right ventricle. Unchanged cardiac enlargement. Loculated right pleural effusion is unchanged. No acute airspace opacities. IMPRESSION: Interval placement of  right sided dialysis catheter with tips in the right atrium. No pneumothorax. Electronically Signed   By: Kerby Moors M.D.   On: 11/19/2019 10:38   DG CHEST PORT 1 VIEW  Result Date: 11/18/2019 CLINICAL DATA:  Central line placement EXAM: PORTABLE CHEST 1 VIEW COMPARISON:  11/18/2019 FINDINGS: There is a new right-sided central venous catheter with tip projecting over the cavoatrial junction. There is no pneumothorax. There is a stable appearance of the right lung field with a probable loculated pleural effusion with adjacent airspace disease. There is a dual chamber pacemaker in place. There is cardiomegaly. IMPRESSION: 1. New right-sided central venous catheter with tip projecting over the cavoatrial junction. No pneumothorax. 2. Otherwise, no significant interval change. Electronically Signed   By: Constance Holster M.D.   On: 11/18/2019 18:24   DG Fluoro Guide CV Line-No Report  Result Date: 11/04/2019 Fluoroscopy was utilized by the requesting physician.  No radiographic interpretation.        Scheduled Meds: . atorvastatin  10 mg Oral Daily  . bisacodyl  10 mg Oral Daily  . carvedilol  3.125 mg Oral BID WC  . Chlorhexidine Gluconate Cloth  6 each Topical Q0600  . heparin sodium (porcine)      . insulin aspart  0-15 Units Subcutaneous TID WC  . midodrine  10 mg Oral BID WC   Continuous Infusions: . sodium chloride 10 mL/hr at 11/08/2019 0727  . heparin 1,550 Units/hr (10/27/2019 1241)    Domenic Polite, MD [facility] 11/08/2019, 2:54 PM

## 2019-11-20 NOTE — Progress Notes (Signed)
PT Cancellation Note  Patient Details Name: Frederic Tones MRN: [id number] DOB: 1955-03-07   Cancelled Treatment:    Reason Eval/Treat Not Completed: Patient at procedure or test/ unavailable--will follow and see as able.   Lyanne Co, DPT Acute Rehabilitation Services [phone]  Renne Crigler 11/13/2019, 9:47 Am

## 2019-11-20 NOTE — Progress Notes (Signed)
PT Cancellation Note  Patient Details Name: Kyle Newton MRN: [id number] DOB: January 09, 1956   Cancelled Treatment:     RN deferred treatment, pt will be going to dialysis soon, RN asked for [place], DPT Acute Rehabilitation Services [phone]   Kyle Newton 10/26/2019, 1:32 PM

## 2019-11-20 NOTE — Progress Notes (Signed)
ANTICOAGULATION CONSULT NOTE - Follow Up Consult  Pharmacy Consult for Heparin Indication: atrial fibrillation  No Known Allergies  Patient Measurements: Height: 5\' 9"  (175.3 cm) Weight: 92.7 kg (204 lb 5.9 oz) IBW/kg (Calculated) : 70.7 Heparin Dosing Weight: 91 kg  Vital Signs: Temp: 97.5 F (36.4 C) (10/27 1408) Temp Source: Axillary (10/27 1408) BP: 109/84 (10/27 1538) Pulse Rate: 65 (10/27 1411)  Labs: Recent Labs    11/19/19 0346 11/19/19 0346 11/19/19 1840 11/12/2019 0229 10/31/2019 0943  HGB 10.7*   < >  --  9.6* 9.1*  HCT 33.3*  --   --  30.6* 29.0*  PLT 104*  --   --  81* 96*  HEPARINUNFRC 0.76*  --  <0.10* 0.24*  --   CREATININE 3.95*  --   --  3.24* 3.25*  TROPONINIHS  --   --   --   --  61*   < > = values in this interval not displayed.    Estimated Creatinine Clearance: 25.8 mL/min (A) (by C-G formula based on SCr of 3.25 mg/dL (H)).  Assessment:  57 yoM with PMH CHF, Afib previously on Eliquis, bioprosthetic AVR, HLD, DM2, admitted with scrotal/groin swelling; found to be in [facility] with AKI, possibly cardiorenal. Pharmacy to start heparin for Afib in setting of AKI.   Baseline INR, aPTT: n/a  Prior anticoagulation: Eliquis 2.5 mg bid; LD 10/18 - has been off most other meds for almost 2 months d/t being unable/unwilling to fill Rx. Appears Eliquis Rx was for 5 mg bid, but suspect patient may have been splitting tabs to prolong supply.    Transferred from [place] to [place] on 10/25 for hemodialysis.      Heparin level supbratherapeutic (0.24) on 1550 units/hr ~2:30am. Unsure how long drip had been running after resumed post-HD last night, but likely less than 8 hours.  Hgb trended down to 9.1, platelet count remains low but no further drop.  No bleeding reported.    Heparin was off on call to OR this morning for [facility].  Resumed on return to the floor per RN confirmed with Dr. Donnetta Hutching. ~3 hrs post-op.  Goal of Therapy:  Heparin level 0.3-0.7  units/ml Monitor platelets by anticoagulation protocol: Yes   Plan:   Heparin drip resumed post-op at 1550 units/hr  Heparin level ~8 hrs after resuming.  Daily heparin level and CBC.  Monitor for signs/symptoms of bleeding.  Follow for eventual transition back to oral anticoagulation.   Arty Baumgartner, [place] Phone: [phone] 10/30/2019,4:29 PM

## 2019-11-20 NOTE — Op Note (Signed)
    OPERATIVE REPORT  DATE OF SURGERY: 11/13/2019  PATIENT: Kyle Newton, 64 y.o. male MRN: [id number]  DOB: [birth year]  PRE-OPERATIVE DIAGNOSIS: End-stage renal disease  POST-OPERATIVE DIAGNOSIS:  Same  PROCEDURE: Right IJ tunneled hemodialysis catheter  SURGEON:  Curt Jews, M.D.  PHYSICIAN ASSISTANT: Nurse  The assistant was needed for exposure and to expedite the case  ANESTHESIA: LMA  EBL: per anesthesia record  Total I/O In: 4098 [I.V.:800; IV Piggyback:250] Out: -   BLOOD ADMINISTERED: none  DRAINS: none  SPECIMEN: none  COUNTS CORRECT:  YES  PATIENT DISPOSITION:  PACU - hemodynamically stable  PROCEDURE DETAILS: Patient was taken up and placed supine position with area of the right and left neck prepped draped you sterile fashion.  The patient had a temporary catheter in the right internal jugular vein.  This was prepped and as well.  Guidewire was passed through the catheter and the temporary catheter was removed.  A peel-away sheath was passed over the guidewire.  23 cm tunnel catheter was brought through a separate stab incision to the level of the tunnel catheter entry site.  The dilator and guidewire were removed and the catheter was passed through the peel-away sheath which was removed.  The distal tips were placed in the distal right atrium.  The catheter was flushed with heparinized saline and reoccluded.  The catheter was secured to the skin and secured to the skin with a 3-0 nylon stitch and the inside was closed with a 4 subcuticular Vicryl stitch.  There had been plan for placement of a right arm permanent access.  Following placement of the catheter, the patient became hypotensive in the 50s.  He did require resuscitation with inotropic agents.  Was felt safest to not proceed with the permanent access since he did have a tunneled catheter which was functioning.  Arterial line was placed by anesthesia and he did have improvement in his blood pressure  with resuscitation.  He was extubated in the operating room and was moving all extremities to command.  Was transferred to the recovery room where chest x-ray and EKG are pending   Rosetta Posner, M.D., [facility] 11/12/2019 9:31 AM

## 2019-11-20 NOTE — Transfer of Care (Signed)
Immediate Anesthesia Transfer of Care Note  Patient: Kyle Newton  Procedure(s) Performed: INSERTION OF TUNNELED DIALYSIS CATHETER (Right Neck)  Patient Location: PACU  Anesthesia Type:General  Level of Consciousness: drowsy  Airway & Oxygen Therapy: Patient Spontanous Breathing and Patient connected to nasal cannula oxygen  Post-op Assessment: Report given to RN, Post -op Vital signs reviewed and stable, Patient moving all extremities and Patient moving all extremities X 4  Post vital signs: Reviewed and stable  Last Vitals:  Vitals Value Taken Time  BP 130/77 11/04/2019 0917  Temp 36.8 C 11/19/2019 0917  Pulse 60 10/31/2019 0929  Resp 17 11/19/2019 0929  SpO2 98 % 10/31/2019 0929  Vitals shown include unvalidated device data.  Last Pain:  Vitals:   10/30/2019 0600  TempSrc:   PainSc: 0-No pain         Complications: No complications documented.

## 2019-11-20 NOTE — Progress Notes (Signed)
[facility] Progress Note   Subjective:   Seen on HD today - 2.5L UFG, attempted to increase to 3L as long as BP allows. He is pretty drowsy and says neck hurts. Went down for [facility] and AVF placement this morning - per notes, got very hypotensive with brief loss of pulse after [facility] placement so the AVF was deferred. He is dyspneic now.  Objective Vitals:   11/05/2019 1448 11/13/2019 1453 10/29/2019 1508 10/26/2019 1523  BP: 100/82 108/71 (!) 87/58 95/62  Pulse:      Resp:      Temp:      TempSrc:      SpO2:      Weight:      Height:       Physical Exam General: Ill appearing at the moment, drowsy. Heart: RRR; 2/6 murmur Lungs: CTA anteriorly Abdomen: tense/distended Extremities: 2+ tense LE edema to hips/flank/scrotum Dialysis Access: TDC in R neck (new)  Additional Objective Labs: Basic Metabolic Panel: Recent Labs  Lab 11/19/19 0346 11/02/2019 0229 11/13/2019 0943  NA 137 135 136  K 4.3 4.3 4.4  CL 105 104 105  CO2 22 23 23   GLUCOSE 152* 164* 121*  BUN 74* 54* 56*  CREATININE 3.95* 3.24* 3.25*  CALCIUM 8.3* 8.1* 7.9*   Liver Function Tests: Recent Labs  Lab 11/15/19 0021 11/15/19 0610 11/16/19 0418  AST 29 29 31   ALT 21 20 21   ALKPHOS 140* 151* 154*  BILITOT 1.1 0.8 0.8  PROT 7.7 7.9 7.8  ALBUMIN 2.9* 3.0* 3.0*   CBC: Recent Labs  Lab 10/31/2019 1031 11/15/19 0021 11/17/19 0414 11/17/19 0414 11/18/19 0421 11/18/19 0421 11/19/19 0346 10/31/2019 0229 11/13/2019 0943  WBC 4.4   < > 3.9*   < > 4.0   < > 4.7 5.1 5.9  NEUTROABS 2.7  --   --   --   --   --   --   --   --   HGB 13.7   < > 12.7*   < > 12.6*   < > 10.7* 9.6* 9.1*  HCT 43.8   < > 39.7   < > 39.5   < > 33.3* 30.6* 29.0*  MCV 90.9   < > 88.8  --  87.8  --  86.3 86.9 89.0  PLT 166   < > 179   < > 167   < > 104* 81* 96*   < > = values in this interval not displayed.   Studies/Results: DG Chest Port 1 View  Result Date: 11/23/2019 CLINICAL DATA:  Status post dialysis catheter insertion.  EXAM: PORTABLE CHEST 1 VIEW COMPARISON:  11/18/2019 FINDINGS: Interval placement of right sided dialysis catheter with tips in the right atrium. No pneumothorax identified. Previous median sternotomy and cardiac valve replacement. There is a left chest wall pacer device with leads in the right atrial appendage and right ventricle. Unchanged cardiac enlargement. Loculated right pleural effusion is unchanged. No acute airspace opacities. IMPRESSION: Interval placement of right sided dialysis catheter with tips in the right atrium. No pneumothorax. Electronically Signed   By: Kerby Moors M.D.   On: 11/12/2019 10:38   DG CHEST PORT 1 VIEW  Result Date: 11/18/2019 CLINICAL DATA:  Central line placement EXAM: PORTABLE CHEST 1 VIEW COMPARISON:  11/18/2019 FINDINGS: There is a new right-sided central venous catheter with tip projecting over the cavoatrial junction. There is no pneumothorax. There is a stable appearance of the right lung field with a probable loculated pleural  effusion with adjacent airspace disease. There is a dual chamber pacemaker in place. There is cardiomegaly. IMPRESSION: 1. New right-sided central venous catheter with tip projecting over the cavoatrial junction. No pneumothorax. 2. Otherwise, no significant interval change. Electronically Signed   By: Constance Holster M.D.   On: 11/18/2019 18:24   DG Fluoro Guide CV Line-No Report  Result Date: 11/04/2019 Fluoroscopy was utilized by the requesting physician.  No radiographic interpretation.   Medications: . sodium chloride 10 mL/hr at 11/17/2019 0727  . heparin 1,550 Units/hr (11/13/2019 1241)   . atorvastatin  10 mg Oral Daily  . bisacodyl  10 mg Oral Daily  . carvedilol  3.125 mg Oral BID WC  . Chlorhexidine Gluconate Cloth  6 each Topical Q0600  . heparin sodium (porcine)      . insulin aspart  0-15 Units Subcutaneous TID WC  . midodrine  10 mg Oral BID WC    Dialysis Orders: New start - establishing  orders  Assessment/Plan: 1. AoCKD with severe overload w/ anasarca in setting of cardiomyopathy (EF 35%): Baseline Cr 2.5 range. Cr on admit 3.9 and trended up to 4.64 on 10/25 (GFR 13) with inadequate diuresis + development of uremic symptoms. Dialysis started on 10/25 - will need serial HD for anasarca. Treating as ESRD. CLIP process started. [facility] consulted for permanent access - TDC placed 10/27, hypotensive episode with brief loss of pulse prevented AVF placement - got short-term pressors, now off. 2. Hyperkalemia: Resolved with HD. 3. HypoTN/volume: BP low; on dose Coreg. Hydralazine d/c'd. Midodrine 10mg  BID started here. 4. Anemia: Hgb 9.1. Will get iron studies, likely start ESA soon. 5. Secondary hyperparathyroidism: CorrCa ok, Phos pending. No binders, will check PTH. 6. Nutrition: Alb low - starting supplements. 7.  HFrEF (EF 35%), s/p ICD 8. Hx AVR 9. A-fib/flutter (Hx AV nodal ablation): On Eliquis at home + BB 10. T2DM: Per primary  Veneta Penton, PA-C 11/19/2019, 3:46 PM  [facility]

## 2019-11-20 NOTE — Progress Notes (Signed)
ANTICOAGULATION CONSULT NOTE - Follow Up Consult  Pharmacy Consult for Heparin Indication: atrial fibrillation  No Known Allergies  Patient Measurements: Height: 5\' 9"  (175.3 cm) Weight: 90 kg (198 lb 6.6 oz) IBW/kg (Calculated) : 70.7 Heparin Dosing Weight: 91 kg  Vital Signs: Temp: 97.5 F (36.4 C) (10/27 2137) Temp Source: Oral (10/27 1806) BP: 112/71 (10/27 2137) Pulse Rate: 68 (10/27 2137)  Labs: Recent Labs    11/19/19 0346 11/19/19 0346 11/19/19 1840 11/07/2019 0229 10/25/2019 0943 11/24/2019 2032  HGB 10.7*   < >  --  9.6* 9.1*  --   HCT 33.3*  --   --  30.6* 29.0*  --   PLT 104*  --   --  81* 96*  --   HEPARINUNFRC 0.76*   < > <0.10* 0.24*  --  0.19*  CREATININE 3.95*  --   --  3.24* 3.25*  --   TROPONINIHS  --   --   --   --  61*  --    < > = values in this interval not displayed.    Estimated Creatinine Clearance: 25.5 mL/min (A) (by C-G formula based on SCr of 3.25 mg/dL (H)).  Assessment:  56 yoM with PMH CHF, Afib previously on Eliquis, bioprosthetic AVR, HLD, DM2, admitted with scrotal/groin swelling; found to be in [facility] with AKI, possibly cardiorenal. Pharmacy to start heparin for Afib in setting of AKI.   Baseline INR, aPTT: n/a  Prior anticoagulation: Eliquis 2.5 mg bid; LD 10/18 - has been off most other meds for almost 2 months d/t being unable/unwilling to fill Rx. Appears Eliquis Rx was for 5 mg bid, but suspect patient may have been splitting tabs to prolong supply.    Transferred from [place] to [place] on 10/25 for hemodialysis.      Heparin level subtherapeutic (0.19) on 1550 units/hr. No bleeding noted or problems with infusion per RN.   Goal of Therapy:  Heparin level 0.3-0.7 units/ml Monitor platelets by anticoagulation protocol: Yes   Plan:   Increase heparin drip to 1750 units/hr  Heparin level ~8 hrs  Daily heparin level and CBC  Monitor for signs/symptoms of bleeding  Follow for eventual transition back to oral  anticoagulation   Thank you for involving pharmacy in this patient's care.  Renold Genta, PharmD, BCPS Clinical Pharmacist Clinical phone for 11/17/2019 until 10p is [phone] 11/10/2019 10:07 PM  **Pharmacist phone directory can be found on [email or web address] listed under [place]**

## 2019-11-20 NOTE — Anesthesia Procedure Notes (Signed)
Arterial Line Insertion Start/End10/12/2019 8:35 AM, 11/17/2019 8:41 AM Performed by: Roberts Gaudy, MD, anesthesiologist  Patient location: OR. Patient sedated Left, radial was placed Catheter size: 20 G Hand hygiene performed  and maximum sterile barriers used   Attempts: 1 Procedure performed without using ultrasound guided technique. Following insertion, dressing applied and Biopatch. Post procedure assessment: normal  Patient tolerated the procedure well with no immediate complications.

## 2019-11-21 ENCOUNTER — Encounter (HOSPITAL_COMMUNITY): Payer: Self-pay | Admitting: Vascular Surgery

## 2019-11-21 DIAGNOSIS — E875 Hyperkalemia: Secondary | ICD-10-CM | POA: Diagnosis not present

## 2019-11-21 DIAGNOSIS — N179 Acute kidney failure, unspecified: Secondary | ICD-10-CM | POA: Diagnosis not present

## 2019-11-21 DIAGNOSIS — I509 Heart failure, unspecified: Secondary | ICD-10-CM | POA: Diagnosis not present

## 2019-11-21 LAB — GLUCOSE, CAPILLARY
Glucose-Capillary: 191 mg/dL — ABNORMAL HIGH (ref 70–99)
Glucose-Capillary: 120 mg/dL — ABNORMAL HIGH (ref 70–99)
Glucose-Capillary: 123 mg/dL — ABNORMAL HIGH (ref 70–99)
Glucose-Capillary: 164 mg/dL — ABNORMAL HIGH (ref 70–99)
Glucose-Capillary: 146 mg/dL — ABNORMAL HIGH (ref 70–99)

## 2019-11-21 LAB — BASIC METABOLIC PANEL
Anion gap: 9 (ref 5–15)
BUN: 39 mg/dL — ABNORMAL HIGH (ref 8–23)
CO2: 26 mmol/L (ref 22–32)
Calcium: 8 mg/dL — ABNORMAL LOW (ref 8.9–10.3)
Chloride: 100 mmol/L (ref 98–111)
Creatinine, Ser: 3.47 mg/dL — ABNORMAL HIGH (ref 0.61–1.24)
GFR, Estimated: 19 mL/min — ABNORMAL LOW (ref >60)
Glucose, Bld: 231 mg/dL — ABNORMAL HIGH (ref 70–99)
Potassium: 4.2 mmol/L (ref 3.5–5.1)
Sodium: 135 mmol/L (ref 135–145)

## 2019-11-21 LAB — CBC
HCT: 24.1 % — ABNORMAL LOW (ref 39.0–52.0)
Hemoglobin: 7.5 g/dL — ABNORMAL LOW (ref 13.0–17.0)
MCH: 28.1 pg (ref 26.0–34.0)
MCHC: 31.1 g/dL (ref 30.0–36.0)
MCV: 90.3 fL (ref 80.0–100.0)
Platelets: 77 10*3/uL — ABNORMAL LOW (ref 150–400)
RBC: 2.67 MIL/uL — ABNORMAL LOW (ref 4.22–5.81)
RDW: 17.4 % — ABNORMAL HIGH (ref 11.5–15.5)
WBC: 7.2 10*3/uL (ref 4.0–10.5)
nRBC: 0 % (ref 0.0–0.2)

## 2019-11-21 LAB — HEPARIN LEVEL (UNFRACTIONATED): Heparin Unfractionated: 0.42 IU/mL (ref 0.30–0.70)

## 2019-11-21 MED ORDER — LACTATED RINGERS IV BOLUS
500.0000 mL | Freq: Once | INTRAVENOUS | Status: AC
  Administered 2019-11-21: 500 mL via INTRAVENOUS

## 2019-11-21 MED ORDER — HYDROMORPHONE HCL 1 MG/ML IJ SOLN: 1.0000 mg | INTRAMUSCULAR | Status: DC | PRN

## 2019-11-21 MED ORDER — SODIUM CHLORIDE 0.9 % IV SOLN
510.0000 mg | Freq: Once | INTRAVENOUS | Status: AC
  Administered 2019-11-21: 510 mg via INTRAVENOUS
  Filled 2019-11-21: qty 17

## 2019-11-21 MED ORDER — OXYCODONE-ACETAMINOPHEN 5-325 MG PO TABS
1.0000 | ORAL_TABLET | Freq: Once | ORAL | Status: AC
  Administered 2019-11-21: 1 via ORAL
  Filled 2019-11-21: qty 1

## 2019-11-21 MED ORDER — HEPARIN SODIUM (PORCINE) 1000 UNIT/ML IJ SOLN
INTRAMUSCULAR | Status: AC
  Administered 2019-11-21: 2500 [IU] via INTRAVENOUS_CENTRAL
  Filled 2019-11-21: qty 4

## 2019-11-21 NOTE — Progress Notes (Signed)
ANTICOAGULATION CONSULT NOTE - Follow Up Consult  Pharmacy Consult for Heparin Indication: atrial fibrillation  No Known Allergies  Patient Measurements: Height: 5\' 9"  (175.3 cm) Weight: 90.3 kg (199 lb 1.2 oz) IBW/kg (Calculated) : 70.7 Heparin Dosing Weight: 90 kg  Vital Signs: Temp: 97.5 F (36.4 C) (10/28 0725) Temp Source: Oral (10/28 0725) BP: 97/61 (10/28 1019) Pulse Rate: 87 (10/28 0904)  Labs: Recent Labs    11/18/2019 0229 11/12/2019 0229 11/19/2019 0943 11/13/2019 2032 11/21/19 0807 11/21/19 0808  HGB 9.6*   < > 9.1*  --   --  7.5*  HCT 30.6*  --  29.0*  --   --  24.1*  PLT 81*  --  96*  --   --  77*  HEPARINUNFRC 0.24*  --   --  0.19* 0.42  --   CREATININE 3.24*  --  3.25*  --  3.47*  --   TROPONINIHS  --   --  61*  --   --   --    < > = values in this interval not displayed.   Assessment:  2 yoM with PMH CHF, Afib previously on Eliquis, bioprosthetic AVR, HLD, DM2, admitted with scrotal/groin swelling; found to be in [facility] with AKI, possibly cardiorenal. Pharmacy to start heparin for Afib in setting of AKI.   Baseline INR, aPTT: n/a  Prior anticoagulation: Eliquis 2.5 mg bid; LD 10/18 - has been off most other meds for almost 2 months d/t being unable/unwilling to fill Rx. Appears Eliquis Rx was for 5 mg bid, but suspect patient may have been splitting tabs to prolong supply.    Transferred from [place] to [place] on 10/25 for hemodialysis.      Heparin level is therapeutic (0.42) on 1750 units/hr.  Hgb trended down to 7.5,  platelet count trended down to 77. No bleeding reported.  Feraheme 510 mg IV x 1 given today.  Noted plan for ESA.  Goal of Therapy:  Heparin level 0.3-0.7 units/ml Monitor platelets by anticoagulation protocol: Yes   Plan:   Continue heparin drip at 1750 units/hr  Daily heparin level and CBC.  Consider checking HIT antibody if platelet count falls again.  Monitor for signs/symptoms of bleeding.  Follow for eventual  transition back to oral anticoagulation.   Noted plan for AVF vs AVG as outpatient or early next week if still inpatient.  Arty Baumgartner, [place] Phone: [phone] 11/21/2019,11:09 AM

## 2019-11-21 NOTE — Progress Notes (Signed)
Patient has been accepted at [facility] on a TTS schedule with a seat time of 12:40pm. He needs to arrive to his appointments at 12:20pm. On his first day at the clinic, he needs to arrive at 11:40am to complete intake paperwork.  Navigator attempted to meet with patient at HD bedside, but he was lethargic and unable to receive information at this time. Navigator will follow for disposition and assist with smooth transition from hospital to outpatient clinic.  Navigator will also look for an opportunity to complete Access [place] application for patient.   Alphonzo Cruise, [place] [facility] Navigator [phone]

## 2019-11-21 NOTE — Evaluation (Signed)
Occupational Therapy Evaluation Patient Details Name: Kyle Newton MRN: [id number] DOB: 01/18/1956 Today's Date: 11/21/2019    History of Present Illness Pt is a 64 y/o male with PMH significant for CHF, HTN, DM2, aortic valve replacement and AICD, chronic atrial fibrillation status post AV nodal ablation, chronic renal disease stage IV, noncompliance to medication presenting with 2 weeks of scrotal swelling, generalized edema, dyspnea, fatigue. Admitted for acute on chronic systolic CHF. Transferred to [facility] and started on HD 10/25.    Clinical Impression   PTA patient reports independent with ADLs, mobility; not driving. Admitted for above and limited by problem list below, including L abdominal pain, impaired balance, generalized weakness and impaired cognition.  Patient requires min assist for bed mobility, max assist +2 for sit to stand from EOB, setup to total assist for ADLs.  Limited today due to pain in L side abdomen, therapists assisted with repositioning and changing sheets; once comfortable in bed, patient appears quite fatigued.  Noted BP drop from EOB to standing, SBP from 107 at EOB to 83 in standing; recovered to 101/61 supine- check orthostatics next session. He is oriented, able to follow simple commands, but decreased problem solving and awareness to deficits.  Believe he will benefit from continued OT services while admitted and after dc at SNF level to optimize independence, safety with ADLs, mobility.     Follow Up Recommendations  SNF;Supervision/Assistance - 24 hour    Equipment Recommendations  3 in 1 bedside commode    Recommendations for Other Services       Precautions / Restrictions Precautions Precautions: Fall Precaution Comments: scrotal edema Restrictions Weight Bearing Restrictions: No      Mobility Bed Mobility Overal bed mobility: Needs Assistance Bed Mobility: Supine to Sit;Sit to Supine     Supine to sit: Min assist;HOB elevated Sit to  supine: Min guard   General bed mobility comments: pt transitioned to EOB with min assist for trunk support, min guard to return supine; cueing for safety     Transfers Overall transfer level: Needs assistance Equipment used: Rolling walker (2 wheeled) Transfers: Sit to/from Stand Sit to Stand: Max assist;+2 physical assistance;+2 safety/equipment         General transfer comment: max assist +2 to power up and steady from EOB with cueing for hand placement and safety     Balance Overall balance assessment: Needs assistance Sitting-balance support: No upper extremity supported;Feet supported Sitting balance-Leahy Scale: Fair Sitting balance - Comments: static sitting with min guard for safety    Standing balance support: Bilateral upper extremity supported;During functional activity Standing balance-Leahy Scale: Poor Standing balance comment: relies on BUE and external support                            ADL either performed or assessed with clinical judgement   ADL Overall ADL's : Needs assistance/impaired     Grooming: Set up;Bed level;Wash/dry face   Upper Body Bathing: Minimal assistance;Sitting   Lower Body Bathing: Moderate assistance;Sit to/from stand;+2 for physical assistance;+2 for safety/equipment   Upper Body Dressing : Minimal assistance;Sitting   Lower Body Dressing: Total assistance;+2 for physical assistance;+2 for safety/equipment;Sit to/from Health and safety inspector Details (indicate cue type and reason): deferred          Functional mobility during ADLs: Maximal assistance;+2 for physical assistance;+2 for safety/equipment;Rolling walker;Cueing for sequencing;Cueing for safety General ADL Comments: pt limited by L sided abdominal pain, cognition, impaired  balance and generalized weakness      Vision         Perception     Praxis      Pertinent Vitals/Pain Pain Assessment: 0-10 Pain Score: 10-Worst pain ever Pain Location:  stomach, L side Pain Descriptors / Indicators: Discomfort;Grimacing;Guarding Pain Intervention(s): Limited activity within patient's tolerance;Monitored during session;Repositioned     Hand Dominance     Extremity/Trunk Assessment Upper Extremity Assessment Upper Extremity Assessment: Generalized weakness   Lower Extremity Assessment Lower Extremity Assessment: Defer to PT evaluation       Communication Communication Communication: No difficulties   Cognition Arousal/Alertness: Awake/alert Behavior During Therapy: WFL for tasks assessed/performed Overall Cognitive Status: Impaired/Different from baseline Area of Impairment: Attention;Memory;Following commands;Safety/judgement;Awareness;Problem solving                   Current Attention Level: Sustained Memory: Decreased recall of precautions;Decreased short-term memory Following Commands: Follows one step commands consistently;Follows one step commands with increased time;Follows multi-step commands inconsistently Safety/Judgement: Decreased awareness of safety;Decreased awareness of deficits Awareness: Emergent Problem Solving: Slow processing;Difficulty sequencing;Requires verbal cues;Requires tactile cues General Comments: patient oriented, follows simple commands with increased time but difficutly following mulitple step commands, decreased awareness of deficits and safety    General Comments  pt on 2L via [place] with SpO2 91% or greater; HR ranges 65-80s; BP assessed postionally-- SBP decreasing from 107 to 83 from EOB to standing; 101/61 once supine     Exercises     Shoulder Instructions      Home Living Family/patient expects to be discharged to:: Private residence Living Arrangements: Alone   Type of Home: Apartment Home Access: Stairs to enter [employer] of Steps: 1   Home Layout: One level     Bathroom Shower/Tub: Occupational psychologist: Standard     Home Equipment: None           Prior Functioning/Environment Level of Independence: Independent        Comments: reports independent with mobility, ADLs; not driving         OT Problem List: Decreased strength;Decreased activity tolerance;Impaired balance (sitting and/or standing);Decreased cognition;Decreased safety awareness;Decreased knowledge of use of DME or AE;Decreased knowledge of precautions;Obesity;Pain      OT Treatment/Interventions: Self-care/ADL training;DME and/or AE instruction;Therapeutic activities;Cognitive remediation/compensation;Patient/family education;Balance training;Therapeutic exercise    OT Goals(Current goals can be found in the care plan section) Acute Rehab OT Goals Patient Stated Goal: less pain  OT Goal Formulation: With patient Time For Goal Achievement: 12/05/19 Potential to Achieve Goals: Good  OT Frequency: Min 2X/week   Barriers to D/C:            Co-evaluation PT/OT/SLP Co-Evaluation/Treatment: Yes Reason for Co-Treatment: For patient/therapist safety;To address functional/ADL transfers;Necessary to address cognition/behavior during functional activity   OT goals addressed during session: ADL's and self-care      AM-PAC OT "6 Clicks" Daily Activity     Outcome Measure Help from another person eating meals?: A Little Help from another person taking care of personal grooming?: A Little Help from another person toileting, which includes using toliet, bedpan, or urinal?: A Lot Help from another person bathing (including washing, rinsing, drying)?: A Lot Help from another person to put on and taking off regular upper body clothing?: A Little Help from another person to put on and taking off regular lower body clothing?: A Lot 6 Click Score: 15   End of Session Equipment Utilized During Treatment: Rolling walker;Oxygen Nurse Communication: Mobility status  Activity Tolerance: Patient limited by pain Patient left: in bed;with call bell/phone within  reach;with bed alarm set  OT Visit Diagnosis: Other abnormalities of gait and mobility (R26.89);Muscle weakness (generalized) (M62.81);Pain;Other symptoms and signs involving cognitive function Pain - Right/Left: Left Pain - part of body:  (abdomen)                Time: 9381-8299 OT Time Calculation (min): 21 min Charges:  OT General Charges $OT Visit: 1 Visit OT Evaluation $OT Eval Moderate Complexity: 1 Mod  Jolaine Artist, OT Acute Rehabilitation Services Pager [phone] Office [phone]   Delight Stare 11/21/2019, 3:23 PM

## 2019-11-21 NOTE — Progress Notes (Signed)
Called by CCMD ,made aware that patient has 35 beats of PVC sustained,went into patient's room to assess.Found patient sitting in a recliner ,was just moved from bed by a nurse and n.t.

## 2019-11-21 NOTE — Progress Notes (Signed)
[facility] Progress Note   Dialysis Orders: New start - establishing orders  Assessment/ Plan:   1. AoCKD with severe overload w/ anasarca in setting of cardiomyopathy (EF 35%): Baseline Cr 2.5 range. Cr on admit 3.9 and trended up to 4.64 on 10/25 (GFR 13) with inadequate diuresis + development of uremic symptoms. Dialysis started on 10/25 - will need serial HD for anasarca. Treating as ESRD. CLIP process started. [facility] consulted for permanent access - TDC placed 10/27, with HD #1 10/27 with 2.8L net UF  Seen on HD #2 today with goal 4.5L (net 4L) as tolerated through the RIJ TC.  Plan on HD #3  Tomorrow mainly for UF and then will eval if treatment on Sat is necessary.  2. Hyperkalemia: Resolved with HD. 3. HypoTN/volume: BP low; on dose Coreg. Hydralazine d/c'd. Midodrine 10mg  BID started here. 4. Anemia: Hgb 9.1. TSAT only 12% with a ferritin of 6 -> iron infusion and then start  ESA  5. Secondary hyperparathyroidism: CorrCa ok, Phos 2.9 -> no binders, will check PTH. 6. Nutrition: Alb low - starting supplements. 7.  HFrEF (EF 35%), s/p ICD 8. Hx AVR 9. A-fib/flutter (Hx AV nodal ablation): On Eliquis at home + BB 10. T2DM: Per primary  Subjective:   C/o constipation, mild abdominal discomfort and not sleeping well. Denies dyspnea/ fever/ chills/ cough   Objective:   BP (!) 125/96 (BP Location: Left Arm)   Pulse 89   Temp 97.8 F (36.6 C) (Oral)   Resp 18   Ht 5\' 9"  (1.753 m)   Wt 89.1 kg   SpO2 95%   BMI 29.01 kg/m   Intake/Output Summary (Last 24 hours) at 11/21/2019 0723 Last data filed at 11/21/2019 0349 Gross per 24 hour  Intake 2170 ml  Output 2800 ml  Net -630 ml   Weight change: -3.8 kg  Physical Exam: General: Ill appearing at the moment, drowsy. Heart: RRR; 2/6 murmur Lungs: CTA anteriorly Abdomen: tense/distended Extremities: 2+ tense LE edema to hips/flank/scrotum Dialysis Access: [facility] in R neck (new)  Imaging: DG Chest Port 1  View  Result Date: 11/01/2019 CLINICAL DATA:  Status post dialysis catheter insertion. EXAM: PORTABLE CHEST 1 VIEW COMPARISON:  11/18/2019 FINDINGS: Interval placement of right sided dialysis catheter with tips in the right atrium. No pneumothorax identified. Previous median sternotomy and cardiac valve replacement. There is a left chest wall pacer device with leads in the right atrial appendage and right ventricle. Unchanged cardiac enlargement. Loculated right pleural effusion is unchanged. No acute airspace opacities. IMPRESSION: Interval placement of right sided dialysis catheter with tips in the right atrium. No pneumothorax. Electronically Signed   By: Kerby Moors M.D.   On: 11/08/2019 10:38   DG Fluoro Guide CV Line-No Report  Result Date: 11/05/2019 Fluoroscopy was utilized by the requesting physician.  No radiographic interpretation.    Labs: BMET Recent Labs  Lab 11/15/19 1107 11/16/19 0418 11/17/19 0414 11/18/19 0421 11/19/19 0346 10/29/2019 0229 11/03/2019 0943 10/26/2019 1820  NA 137 138 137 138 137 135 136  --   K 5.7* 6.0* 5.7* 5.5* 4.3 4.3 4.4  --   CL 113* 112* 109 107 105 104 105  --   CO2 16* 14* 17* 18* 22 23 23   --   GLUCOSE 83 108* 131* 135* 152* 164* 121*  --   BUN 77* 80* 89* 93* 74* 54* 56*  --   CREATININE 4.19* 4.12* 4.55* 4.64* 3.95* 3.24* 3.25*  --   CALCIUM  8.8* 8.9 8.7* 8.8* 8.3* 8.1* 7.9*  --   PHOS  --   --   --   --   --   --   --  2.9   CBC Recent Labs  Lab 11/02/2019 1031 11/15/19 0021 11/18/19 0421 11/19/19 0346 11/15/2019 0229 11/12/2019 0943  WBC 4.4   < > 4.0 4.7 5.1 5.9  NEUTROABS 2.7  --   --   --   --   --   HGB 13.7   < > 12.6* 10.7* 9.6* 9.1*  HCT 43.8   < > 39.5 33.3* 30.6* 29.0*  MCV 90.9   < > 87.8 86.3 86.9 89.0  PLT 166   < > 167 104* 81* 96*   < > = values in this interval not displayed.    Medications:    . (feeding supplement) PROSource Plus  30 mL Oral BID BM  . atorvastatin  10 mg Oral Daily  . bisacodyl  10 mg Oral  Daily  . carvedilol  3.125 mg Oral BID WC  . Chlorhexidine Gluconate Cloth  6 each Topical Q0600  . insulin aspart  0-15 Units Subcutaneous TID WC  . midodrine  10 mg Oral BID WC      Otelia Santee, MD 11/21/2019, 7:23 AM

## 2019-11-21 NOTE — Progress Notes (Signed)
  Progress Note    11/21/2019 7:55 AM 1 Day Post-Op  Subjective:  Seen in HD. Complaining of left hip pain. No recollection of yesterdays events and states he was unaware of his access surgery. Appears to be at baseline mentation though. Otherwise does not seen confused   Vitals:   11/21/19 0725 11/21/19 0731  BP: 108/68 102/64  Pulse: 68 62  Resp: 20 20  Temp: (!) 97.5 F (36.4 C)   SpO2: 100%    Physical Exam: Cardiac: regular, right IJ TDC working well Lungs: 2L [place], non labored Extremities: moving all extremities without deficit. 2+ radial pulses bilaterally Abdomen:  Distended, non tender Neurologic: alert and oriented  CBC    Component Value Date/Time   WBC 5.9 10/28/2019 0943   RBC 3.26 (L) 11/17/2019 0943   HGB 9.1 (L) 11/13/2019 0943   HCT 29.0 (L) 11/23/2019 0943   PLT 96 (L) 11/19/2019 0943   MCV 89.0 11/07/2019 0943   MCH 27.9 10/29/2019 0943   MCHC 31.4 10/31/2019 0943   RDW 17.2 (H) 11/17/2019 0943   LYMPHSABS 0.7 11/23/2019 1031   MONOABS 0.9 11/13/2019 1031   EOSABS 0.2 10/25/2019 1031   BASOSABS 0.1 11/03/2019 1031    BMET    Component Value Date/Time   NA 136 11/10/2019 0943   K 4.4 11/04/2019 0943   CL 105 11/16/2019 0943   CO2 23 11/21/2019 0943   GLUCOSE 121 (H) 11/06/2019 0943   BUN 56 (H) 11/07/2019 0943   CREATININE 3.25 (H) 11/06/2019 0943   CALCIUM 7.9 (L) 11/12/2019 0943   GFRNONAA 20 (L) 11/19/2019 0943   GFRAA 34 (L) 05/01/2019 0345    INR    Component Value Date/Time   INR 1.3 (H) 04/27/2019 0215     Intake/Output Summary (Last 24 hours) at 11/21/2019 0755 Last data filed at 11/21/2019 0349 Gross per 24 hour  Intake 2170 ml  Output 2800 ml  Net -630 ml     Assessment/Plan:  64 y.o. male is s/p TDC insertion 1 Day Post-Op. Patient had hypotension following induction of anesthesia so did not proceed with permanent access placement in RUE. Catheter is working well. Possible plan for AVF vs AVG as outpatient or  possibly rescheduled for early next week if patient is still admitted. Will defer to Dr. Donnetta Hutching for timing of rescheduling this   Karoline Caldwell, PA-C [facility] [phone] 11/21/2019 7:55 AM

## 2019-11-21 NOTE — Progress Notes (Signed)
PT Cancellation Note  Patient Details Name: Kyle Newton MRN: [id number] DOB: December 27, 1955   Cancelled Treatment:    Reason Eval/Treat Not Completed: Patient at procedure or test/unavailable currently at HD- will attempt to see later if he is agreeable and if time/schedule allow.    Windell Norfolk, DPT, PN1   Supplemental Physical Therapist [facility]    Pager [phone] Acute Rehab Office [phone]

## 2019-11-21 NOTE — Progress Notes (Signed)
OT Cancellation Note  Patient Details Name: Prosper Paff MRN: [id number] DOB: [birth year]   Cancelled Treatment:    Reason Eval/Treat Not Completed: Patient at procedure or test/ unavailable  Off unit at HD. Will follow and see as able.   Jolaine Artist, OT Acute Rehabilitation Services Pager [phone] Office [phone]  Delight Stare 11/21/2019, 8:05 AM

## 2019-11-21 NOTE — Progress Notes (Signed)
Patient ID: Kyle Newton, male   DOB: 1955/03/23, 64 y.o.   MRN: [id number]  PROGRESS NOTE   Kyle Newton  QVZ:[id number] DOB: 1955/03/31 DOA: 11/03/2019 PCP: Patient, No Pcp Per   Brief Narrative:  63/M history of chronic systolic heart failure, EF of 25%, diabetes mellitus type 2, aortic valve replacement and AICD, chronic atrial fibrillation status post AV nodal ablation, chronic renal disease stage IV, diabetes mellitus type 2,  noncompliant to medications and follow-up presented with generalized edema, scrotal edema and dyspnea on exertion.  Patient was admitted for acute on chronic systolic CHF exacerbation.  Cardiology was consulted.  He was diuresed.  Renal function worsened.  Nephrology recommended transfer to [facility] for initiation of hemodialysis.  He was transferred to [facility] on 11/18/2019 and was started on hemodialysis on 11/18/2019.  Assessment & Plan:   Acute on chronic systolic heart failure AKI on CKD stage IV New ESRD Admitted with anasarca, scrotal edema, acute kidney injury -baseline creatinine around 2.5, was 3.9 on admission and then trended up to the 4.6 range, Echo noted EF of 35 to 40% -Followed by cardiology initially, diuresed aggressively with Lasix drip and metolazone with limited response, eventually with worsening kidney function nephrology was consulted, transferred to [facility] and started hemodialysis on 10/25, volume managed with HD now -Also has cognitive deficits with long history of poor compliance -Had hypotensive episode in OR yesterday after HD cath placement temporarily requiring Neo-Synephrine, Blood pressure stable now -PT OT eval completed, SNF recommended for rehabilitation -Had another discussion with patient regarding goals of care, patient agrees to DNR now, plan to continue hemodialysis and go to SNF for rehab with palliative care follow-up  Hyperkalemia -Received Lokelma earlier this admission  Essential  hypertension -Blood pressure has been trending down this admission, now on midodrine, also on low-dose carvedilol -10/27 with profound hypotensive episode with blood pressure dropping to the 50s after HD cath placement, transiently requiring pressors, now improved -Stopped Imdur  Diabetes mellitus type 2 -A1c 8.1.  Continue CBGs with SSI.  Patient was not compliant with glipizide as an outpatient -Carb modified diet  History of polysubstance abuse -Has history of marijuana and cocaine abuse.  Urine drug screen positive for cocaine.  Social worker consult  History of aortic valve replacement Chronic atrial fibrillation/flutter -Eliquis on hold.  Currently on heparin drip.  Currently rate controlled.  Continue Coreg, change back to eliquis if no additional plans for AVF as inpatient  Hyperlipidemia -Continue statin  Palliative care encounter -Overall prognosis is guarded to poor given patient's noncompliance and polysubstance abuse, CHF, and now ESRD.  Palliative care has evaluated the patient during this hospitalization. Pt wished for full scope of Rx -I discussed CODE STATUS and prognosis with patient again 10/28, he is agreeable to DNR -Plan for SNF with palliative care  DVT prophylaxis: Heparin drip Code Status: DNR Family Communication: None at bedside Disposition Plan: Status is: Inpatient  Remains inpatient appropriate because:Inpatient level of care appropriate due to severity of illness   Dispo: The patient is from: Home              Anticipated d/c is to: Ideally needs SNF              Anticipated d/c date is: > 3 days              Patient currently is not medically stable to d/c.  Consultants: Cardiology/nephrology/palliative care/IR  Procedures:  Echo  nontunneled hemodialysis catheter placement  by IR on 11/18/2019 Hemodialysis started on 11/18/2019  Antimicrobials: None  Subjective: -Just back from dialysis, feels tired and weak  Objective: Vitals:    11/21/19 1019 11/21/19 1049 11/21/19 1114 11/21/19 1202  BP: 97/61 (!) 87/58 (!) 93/55 91/67  Pulse:    62  Resp: 14 (!) 22 16 18   Temp:   98 F (36.7 C) 97.9 F (36.6 C)  TempSrc:   Oral Oral  SpO2:   100% 96%  Weight:   86.7 kg   Height:        Intake/Output Summary (Last 24 hours) at 11/21/2019 1332 Last data filed at 11/21/2019 1114 Gross per 24 hour  Intake 1120 ml  Output 6460 ml  Net -5340 ml   Filed Weights   11/21/19 0500 11/21/19 0725 11/21/19 1114  Weight: 89.1 kg 90.3 kg 86.7 kg    Examination:  General exam: Chronically ill male sitting up in bed, awake alert oriented to self, and place, partly to time, cognitive deficits noted CVS: S1-S2, regular rate rhythm, systolic murmur Lungs: Decreased breath sounds to bases, otherwise clear Abdomen: Soft, less distended, nontender, bowel sounds present Extremities: 1+ edema   Skin: Multiple healed lesions on skin noted all over the body     Data Reviewed: I have personally reviewed following labs and imaging studies  CBC: Recent Labs  Lab 11/18/19 0421 11/19/19 0346 11/05/2019 0229 11/06/2019 0943 11/21/19 0808  WBC 4.0 4.7 5.1 5.9 7.2  HGB 12.6* 10.7* 9.6* 9.1* 7.5*  HCT 39.5 33.3* 30.6* 29.0* 24.1*  MCV 87.8 86.3 86.9 89.0 90.3  PLT 167 104* 81* 96* 77*   Basic Metabolic Panel: Recent Labs  Lab 11/18/19 0421 11/19/19 0346 11/24/2019 0229 11/19/2019 0943 11/13/2019 1820 11/21/19 0807  NA 138 137 135 136  --  135  K 5.5* 4.3 4.3 4.4  --  4.2  CL 107 105 104 105  --  100  CO2 18* 22 23 23   --  26  GLUCOSE 135* 152* 164* 121*  --  231*  BUN 93* 74* 54* 56*  --  39*  CREATININE 4.64* 3.95* 3.24* 3.25*  --  3.47*  CALCIUM 8.8* 8.3* 8.1* 7.9*  --  8.0*  PHOS  --   --   --   --  2.9  --    GFR: Estimated Creatinine Clearance: 23.5 mL/min (A) (by C-G formula based on SCr of 3.47 mg/dL (H)). Liver Function Tests: Recent Labs  Lab 11/15/19 0021 11/15/19 0610 11/16/19 0418  AST 29 29 31   ALT 21 20 21     ALKPHOS 140* 151* 154*  BILITOT 1.1 0.8 0.8  PROT 7.7 7.9 7.8  ALBUMIN 2.9* 3.0* 3.0*   No results for input(s): LIPASE, AMYLASE in the last 168 hours. No results for input(s): AMMONIA in the last 168 hours. Coagulation Profile: No results for input(s): INR, PROTIME in the last 168 hours. Cardiac Enzymes: No results for input(s): CKTOTAL, CKMB, CKMBINDEX, TROPONINI in the last 168 hours. BNP (last 3 results) No results for input(s): PROBNP in the last 8760 hours. HbA1C: No results for input(s): HGBA1C in the last 72 hours. CBG: Recent Labs  Lab 11/05/2019 1806 11/13/2019 2123 11/21/19 0703 11/21/19 1138 11/21/19 1202  GLUCAP 168* 262* 191* 120* 123*   Lipid Profile: No results for input(s): CHOL, HDL, LDLCALC, TRIG, CHOLHDL, LDLDIRECT in the last 72 hours. Thyroid Function Tests: No results for input(s): TSH, T4TOTAL, FREET4, T3FREE, THYROIDAB in the last 72 hours. Anemia Panel: Recent Labs  10/25/2019 1820  FERRITIN 6*  TIBC 288  IRON 35*   Sepsis Labs: No results for input(s): PROCALCITON, LATICACIDVEN in the last 168 hours.  Recent Results (from the past 240 hour(s))  Respiratory Panel by RT PCR (Flu A&B, Covid) - Nasopharyngeal Swab     Status: None   Collection Time: 10/28/2019 10:33 AM   Specimen: Nasopharyngeal Swab  Result Value Ref Range Status   SARS Coronavirus 2 by RT PCR NEGATIVE NEGATIVE Final    Comment: (NOTE) SARS-CoV-2 target nucleic acids are NOT DETECTED.  The SARS-CoV-2 RNA is generally detectable in upper respiratoy specimens during the acute phase of infection. The lowest concentration of SARS-CoV-2 viral copies this assay can detect is 131 copies/mL. A negative result does not preclude SARS-Cov-2 infection and should not be used as the sole basis for treatment or other patient management decisions. A negative result may occur with  improper specimen collection/handling, submission of specimen other than nasopharyngeal swab, presence of viral  mutation(s) within the areas targeted by this assay, and inadequate number of viral copies (<131 copies/mL). A negative result must be combined with clinical observations, patient history, and epidemiological information. The expected result is Negative.  Fact Sheet for Patients:  PinkCheek.be  Fact Sheet for Healthcare Providers:  GravelBags.it  This test is no t yet approved or cleared by the [place] FDA and  has been authorized for detection and/or diagnosis of SARS-CoV-2 by FDA under an Emergency Use Authorization (EUA). This EUA will remain  in effect (meaning this test can be used) for the duration of the COVID-19 declaration under Section 564(b)(1) of the Act, 21 U.S.C. section 360bbb-3(b)(1), unless the authorization is terminated or revoked sooner.     Influenza A by PCR NEGATIVE NEGATIVE Final   Influenza B by PCR NEGATIVE NEGATIVE Final    Comment: (NOTE) The Xpert Xpress SARS-CoV-2/FLU/RSV assay is intended as an aid in  the diagnosis of influenza from Nasopharyngeal swab specimens and  should not be used as a sole basis for treatment. Nasal washings and  aspirates are unacceptable for Xpert Xpress SARS-CoV-2/FLU/RSV  testing.  Fact Sheet for Patients: PinkCheek.be  Fact Sheet for Healthcare Providers: GravelBags.it  This test is not yet approved or cleared by the [place] FDA and  has been authorized for detection and/or diagnosis of SARS-CoV-2 by  FDA under an Emergency Use Authorization (EUA). This EUA will remain  in effect (meaning this test can be used) for the duration of the  Covid-19 declaration under Section 564(b)(1) of the Act, 21  U.S.C. section 360bbb-3(b)(1), unless the authorization is  terminated or revoked. Performed at [facility], [place] [address], [place], [place] [address]   MRSA PCR Screening      Status: None   Collection Time: 11/03/2019  3:38 AM   Specimen: Nasal Mucosa; Nasopharyngeal  Result Value Ref Range Status   MRSA by PCR NEGATIVE NEGATIVE Final    Comment:        The GeneXpert MRSA Assay (FDA approved for NASAL specimens only), is one component of a comprehensive MRSA colonization surveillance program. It is not intended to diagnose MRSA infection nor to guide or monitor treatment for MRSA infections. Performed at [facility], [place] [address] [place], [place] [address]          Radiology Studies: DG Chest Port 1 View  Result Date: 10/27/2019 CLINICAL DATA:  Status post dialysis catheter insertion. EXAM: PORTABLE CHEST 1 VIEW COMPARISON:  11/18/2019 FINDINGS: Interval  placement of right sided dialysis catheter with tips in the right atrium. No pneumothorax identified. Previous median sternotomy and cardiac valve replacement. There is a left chest wall pacer device with leads in the right atrial appendage and right ventricle. Unchanged cardiac enlargement. Loculated right pleural effusion is unchanged. No acute airspace opacities. IMPRESSION: Interval placement of right sided dialysis catheter with tips in the right atrium. No pneumothorax. Electronically Signed   By: Kerby Moors M.D.   On: 11/09/2019 10:38   DG Fluoro Guide CV Line-No Report  Result Date: 10/28/2019 Fluoroscopy was utilized by the requesting physician.  No radiographic interpretation.        Scheduled Meds: . (feeding supplement) PROSource Plus  30 mL Oral BID BM  . atorvastatin  10 mg Oral Daily  . bisacodyl  10 mg Oral Daily  . carvedilol  3.125 mg Oral BID WC  . Chlorhexidine Gluconate Cloth  6 each Topical Q0600  . insulin aspart  0-15 Units Subcutaneous TID WC  . midodrine  10 mg Oral BID WC   Continuous Infusions: . sodium chloride 10 mL/hr at 11/10/2019 0727  . heparin 1,750 Units/hr (11/21/19 0345)    Domenic Polite, MD [facility] 11/21/2019, 1:32 PM

## 2019-11-21 NOTE — Progress Notes (Signed)
Physical Therapy Treatment Patient Details Name: Kyle Newton MRN: [id number] DOB: 05/26/1955 Today's Date: 11/21/2019    History of Present Illness Pt is a 64 y/o male with PMH significant for CHF, HTN, DM2, aortic valve replacement and AICD, chronic atrial fibrillation status post AV nodal ablation, chronic renal disease stage IV, noncompliance to medication presenting with 2 weeks of scrotal swelling, generalized edema, dyspnea, fatigue. Admitted for acute on chronic systolic CHF. Transferred to [facility] and started on HD 10/25.     PT Comments    Patient received in bed, restless and upset, stating "I feel like I'm dying, I'm going to pass away" and reporting abdominal pain; VSS on 2LPM in bed. Able to get to EOB with MinA and felt better, however once standing BP dropped to 83/60s, unable to really progress mobility today due to fatigue and BP levels. Needed more physical assist for OOB transfers today. Very poor safety awareness and actually almost scooted himself off EOB today. Left in bed positioned to comfort with all needs met, RN aware of patient status and BP WNL with return to supine, bed alarm active.     Follow Up Recommendations  SNF     Equipment Recommendations  Rolling walker with 5" wheels    Recommendations for Other Services       Precautions / Restrictions Precautions Precautions: Fall Precaution Comments: scrotal edema, watch BP (can be hypotensive) Restrictions Weight Bearing Restrictions: No    Mobility  Bed Mobility Overal bed mobility: Needs Assistance Bed Mobility: Supine to Sit;Sit to Supine     Supine to sit: Min assist;HOB elevated Sit to supine: Min guard   General bed mobility comments: pt transitioned to EOB with min assist for trunk support, min guard to return supine; cueing for safety   Transfers Overall transfer level: Needs assistance Equipment used: Rolling walker (2 wheeled) Transfers: Sit to/from Stand Sit to Stand: Max assist;+2  physical assistance;+2 safety/equipment         General transfer comment: max assist +2 to power up and steady from EOB with cueing for hand placement and safety   Ambulation/Gait             General Gait Details: unable- too orthostatic   Stairs             Wheelchair Mobility    Modified Rankin (Stroke Patients Only)       Balance Overall balance assessment: Needs assistance Sitting-balance support: No upper extremity supported;Feet supported Sitting balance-Leahy Scale: Fair Sitting balance - Comments: static sitting with min guard for safety    Standing balance support: Bilateral upper extremity supported;During functional activity Standing balance-Leahy Scale: Poor Standing balance comment: relies on BUE and external support                             Cognition Arousal/Alertness: Awake/alert Behavior During Therapy: WFL for tasks assessed/performed Overall Cognitive Status: Impaired/Different from baseline Area of Impairment: Attention;Memory;Following commands;Safety/judgement;Awareness;Problem solving                   Current Attention Level: Sustained Memory: Decreased recall of precautions;Decreased short-term memory Following Commands: Follows one step commands consistently;Follows one step commands with increased time;Follows multi-step commands inconsistently Safety/Judgement: Decreased awareness of safety;Decreased awareness of deficits Awareness: Emergent Problem Solving: Slow processing;Difficulty sequencing;Requires verbal cues;Requires tactile cues General Comments: patient oriented, follows simple commands with increased time but difficutly following mulitple step commands, decreased awareness of deficits and safety  Exercises      General Comments General comments (skin integrity, edema, etc.): 2LPM O2 via [place] with sats >91%; HR 65-80s; orthostatic today with SBP decreasing from 107 to 83 in standing, BP 101/61  with return to supine      Pertinent Vitals/Pain Pain Assessment: 0-10 Pain Score: 10-Worst pain ever Pain Location: stomach, L side Pain Descriptors / Indicators: Discomfort;Grimacing;Guarding Pain Intervention(s): Limited activity within patient's tolerance;Monitored during session;Repositioned    Home Living Family/patient expects to be discharged to:: Private residence Living Arrangements: Alone   Type of Home: Apartment Home Access: Stairs to enter   Home Layout: One level Home Equipment: None      Prior Function Level of Independence: Independent      Comments: reports independent with mobility, ADLs; not driving    PT Goals (current goals can now be found in the care plan section) Acute Rehab PT Goals Patient Stated Goal: less pain  PT Goal Formulation: With patient Time For Goal Achievement: 12/02/19 Potential to Achieve Goals: Good Progress towards PT goals: Progressing toward goals (slowly)    Frequency    Min 3X/week      PT Plan Current plan remains appropriate    Co-evaluation   Reason for Co-Treatment: For patient/therapist safety;To address functional/ADL transfers;Necessary to address cognition/behavior during functional activity   OT goals addressed during session: ADL's and self-care      AM-PAC PT "6 Clicks" Mobility   Outcome Measure  Help needed turning from your back to your side while in a flat bed without using bedrails?: A Little Help needed moving from lying on your back to sitting on the side of a flat bed without using bedrails?: A Little Help needed moving to and from a bed to a chair (including a wheelchair)?: A Lot Help needed standing up from a chair using your arms (e.g., wheelchair or bedside chair)?: A Lot Help needed to walk in hospital room?: A Lot Help needed climbing 3-5 steps with a railing? : A Lot 6 Click Score: 14    End of Session   Activity Tolerance: Patient limited by fatigue;Treatment limited secondary to  medical complications (Comment) (orthostasis) Patient left: in bed;with call bell/phone within reach;with bed alarm set Nurse Communication: Mobility status;Other (comment) (BP) PT Visit Diagnosis: Other abnormalities of gait and mobility (R26.89);Muscle weakness (generalized) (M62.81)     Time: 8337-4451 PT Time Calculation (min) (ACUTE ONLY): 21 min  Charges:  $Therapeutic Activity: 8-22 mins                     Windell Norfolk, DPT, PN1   Supplemental Physical Therapist [place]    Pager [phone] Acute Rehab Office [phone]

## 2019-11-22 LAB — PARATHYROID HORMONE, INTACT (NO CA): PTH: 145 pg/mL — ABNORMAL HIGH (ref 15–65)

## 2019-11-22 LAB — GLUCOSE, CAPILLARY: Glucose-Capillary: 145 mg/dL — ABNORMAL HIGH (ref 70–99)

## 2019-11-22 LAB — TROPONIN T: Troponin T (Highly Sensitive): 97 ng/L — ABNORMAL HIGH (ref 0–22)

## 2019-11-22 MED ORDER — LACTATED RINGERS IV BOLUS
500.0000 mL | Freq: Once | INTRAVENOUS | Status: AC
  Administered 2019-11-22: 500 mL via INTRAVENOUS

## 2019-11-25 NOTE — Progress Notes (Addendum)
Called by RN.  Patient had 500 LR bolus earlier.  Vital signs after bolus his blood pressure of 70/54 with heart rate is 62.  Temperature is 94.1 rectally. Patient be transferred to progressive care. Another 500 LR bolus ordered. Pt required brief pressor support a few days ago and will use pressors if BP does not improve with bolus.  Bearhugger placed to rewarm pt.  Monitor closely.   Rapid response team with patient.  Continues to have low BP despite 500 ml bolus of LR.  Cannot get pressors in progressive care.  Consulted Critical care team to evaluate for transfer to ICU.   Pt evaluated by Critical care team but pt developed agonal breathing as they were obtaining labs and a bed to transfer to.  Pt passed before being transferred to ICU.

## 2019-11-25 NOTE — Progress Notes (Signed)
   12-09-2019 0015  Vitals  BP (!) 85/57  MAP (mmHg) 65  BP Location Left Arm  BP Method Automatic  Patient Position (if appropriate) Lying  Pulse Rate 62  Pulse Rate Source Dinamap  Resp 20  Level of Consciousness  Level of Consciousness Alert  MEWS COLOR  MEWS Score Color Yellow  Oxygen Therapy  SpO2 96 %  O2 Device Nasal Cannula    RR Mindy notified. Will see pt.

## 2019-11-25 NOTE — Progress Notes (Signed)
Called [employer], spoke with Ronney Asters. Referral number [id number]. Possible eye donor. Eye prep completed.

## 2019-11-25 NOTE — Progress Notes (Signed)
Sent all belongings cellphone, wallet and clothing with patient to morgue

## 2019-11-25 NOTE — Significant Event (Addendum)
Rapid Response Event Note   Reason for Call :  Confusion, hypotension, and hypothermia.  Pt is a DNR.    Initial Focused Assessment:  Pt laying in bed with eyes closed. He will awaken to verbal stimulation. He is alert and oriented x 3, confused at times. He is cool, clammy, diaphoretic. Lungs clear on L and diminished on R. Heart murmur present. ABD is firm and distended. T-94.1(R), HR-60(v-paced), BP-74/54, RR-16, SpO2-100% on 5L [place], CBG-145  Interventions:  500cc LR bolus given PTA RRT Additional 500cc LR bolus also given CBG-145 CBC/T and screen  Plan of Care:  Original plan prior to my arrival was to move to PCU, place bairhugger, and monitor. However, after 2nd 500cc bolus, pt's SBP was still in the 70s. TRH MD was updated regarding post-bolus VS. With concern for placing bairhugger and dropping pt's BP further, TRH MD consulted PCCM. Will move to ICU.    Update: 0150-Pt with agonal breathing and now unresponsive to painful stimuli. Pupils 6 and not reactive. BP-47/20, very weak pulse-levophed ordered and lab at bedside to obtain STAT labs. 0153-Pt now apneic, pulse still present. 0158-Pt with no heart sounds, pulse, or respirations-confirmed by Meredeth Ide, NP and PCCM Hunsucker, MD. Pt pronounced. Dr. Tonie Griffith notified.   Event Summary:   MD Notified: Dr. Tonie Griffith Call [phone] Arrival Time:0035 End Time:0210  Dillard Essex, RN

## 2019-11-25 NOTE — Progress Notes (Signed)
Pt with no respirations, heart sounds, or pulse. Confirmed by Meredeth Ide, NP and PCCM Hunsucker, MD. Pt pronounced at 0158. Primary MD Chotiner notifed. Attempt made by PCCM to contact family unsuccessful.

## 2019-11-25 NOTE — Consult Note (Signed)
Contacted via E-Link for new consult for hypotension. Reviewed chart and noted admitted with volume overload. Progress note 10/28 reviewed with documented discussion with patient regarding [facility] and decision to be DNR. EF 25% with CKD4 at baseline. Received aggressive diuresis and now felt to be ESRD. Started HD and had received 2 sessions the past 2 days with ~6.4L removed. Review of labs indicated Hgb in 13s on admission 10/21 with gradual decline last few days, 9.1 to 7.6 from 10/27 to 10/28 AM. He is on heparin drip for Afib. Concerned for occult bleed. Stat CBC and T&S ordered. Went to examine patient. RR and pirmary RN at bedside. Heparin drip stopped at my request. BP low. He was conversant but confused appearing. Moving all extremities. On exam, he was clammy and diaphoretic with warm proximal extremities. Minimal LE edema. Abdomen was quite distended, taught. Did not seem TTP. Order to transfer to ICU placed. Pharmacy was called for bag of levophed. Additional fluids ordered verbally. Within minutes of arrival, he was noted to be agonally breathing, BP 47/20 on cuff. Contact listed in Epic called but could not be reached. Could not palpate a femoral pulse. Doppler could detect weak pulse presumably due to active pacemaker. He was observed to not be breathing for a prolonged period of time. Repeat exam revealed bilateral blown pupils, no corneal reflexes, no gag. Hospitalist notified. Time of death 1:58 AM.

## 2019-11-25 NOTE — Progress Notes (Signed)
   11/21/19 2230  Assess: MEWS Score  BP (!) 87/50  Pulse Rate (!) 59   Pt. found on the  bedside commode with IV lines pulled out, pt. bleeding profusely on his pulled IV site. VS checked. textpaged and made aware on call night provider MD Chotiner. Received a callback, see new orders.

## 2019-11-25 DEATH — deceased

## 2019-12-11 ENCOUNTER — Ambulatory Visit: Payer: Medicare Other | Admitting: General Practice

## 2019-12-25 NOTE — Discharge Summary (Signed)
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 Metabolic encephalopathy History of aortic valve replacement Chronic atrial fibrillation/A-flutter Polysubstance abuse Cocaine abuse Moderate protein calorie malnutrition Noncompliance Palliative care encounter   Congestive heart failure (HCC)   AKI (acute kidney injury) ([place])   Anasarca   Noncompliance with medication regimen   Pressure injury of skin   Acute renal failure superimposed on stage 4 chronic kidney disease (HCC)   Acute on chronic combined systolic and diastolic CHF (congestive heart failure) ([place])  History of present illness:    Hospital Course:   Acute on chronic systolic heart failure AKI on CKD stage IV New ESRD Noncompliance, polysubstance abuse Admitted with anasarca, scrotal edema, acute kidney injury -baseline creatinine around 2.5, was 3.9 on admission and then trended up to the 4.6 range, Echo noted EF of 35% -Followed by cardiology initially, diuresed aggressively with Lasix drip and metolazone with limited response, eventually with worsening kidney function nephrology was consulted, transferred to [facility] and started hemodialysis on 10/25,  -Hospitalization was complicated by hypotension, encephalopathy --Also has cognitive deficits with long history of poor compliance -Had hypotensive episode in OR 10/27 after HD cath placement temporarily requiring Neo-Synephrine, Blood pressure stabilized subsequently -Also followed by palliative care this admission on account of overall very poor prognosis, patient wished to start hemodialysis then on 10/28 agreed to DNR -Around midnight on 10/28 became acutely hypotensive, he was given a liter of saline  bolus, PCCM was consulted and plan was made to transfer him to the ICU, Levophed was ordered, shortly thereafter patient developed agonal breathing and became unresponsive, lost his pulse and he was pronounced dead around 1:58 AM  Hyperkalemia -Received Lokelma earlier this admission  Essential hypertension -Started on midodrine this admission -Low-dose carvedilol was continued with holding parameters given severe cardiomyopathy  Diabetes mellitus type 2 -Hemoglobin A1c was 8.1, admitted to not taking diabetic pills every day -Was on sliding scale this admission  History of polysubstance abuse -Long history of cannabis and and cocaine abuse.  Urine drug screen positive for cocaine.  Seen by social work team this admission  History of aortic valve replacement Chronic atrial fibrillation/flutter -Eliquis was placed on hold, was on a heparin drip pending AV fistula placement -Coreg continued -Plan was to restart Eliquis after all procedures were completed  Palliative care encounter -Overall prognosis is guarded to poor given patient's noncompliance and polysubstance abuse, advanced systolic and diastolic CHF, and now ESRD.  Palliative care has evaluated the patient during this hospitalization.  Patient did not wish for aggressive management initially however finally after much discussion decided to start dialysis -I discussed CODE STATUS and prognosis with patient again 10/28, he told me he knows his time is limited and asked me how much longer he has to live and was agreeable to DNR  Code Status: DNR   Time: 35  Signed:  [facility] 12/06/2019, 2:14 PM

## 2022-07-27 IMAGING — US US SCROTUM
1 series · 14 of 25 positions shown · non-contrast
Comparison: None.

CLINICAL DATA: Scrotal edema.

EXAM:
ULTRASOUND OF SCROTUM
TECHNIQUE: Complete ultrasound examination of the testicles, epididymis, and
other scrotal structures was performed.

[Series 1: us scrotum · 41 acquisitions, 14 frames shown]
[im 1/41]
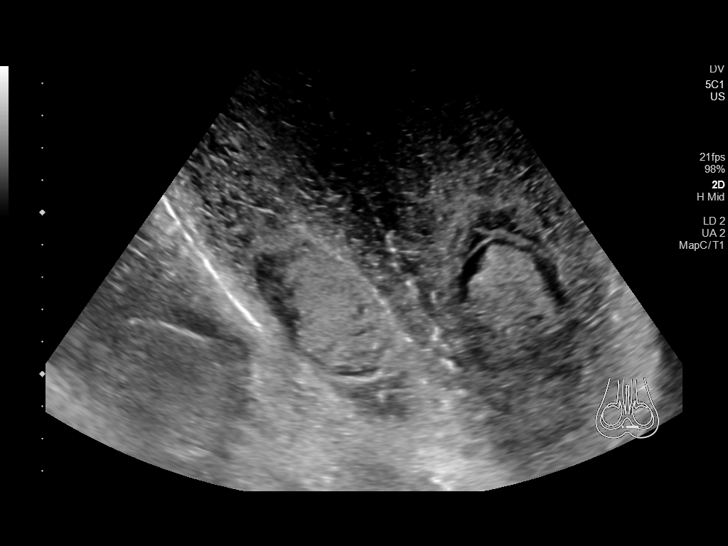
[im 4/41]
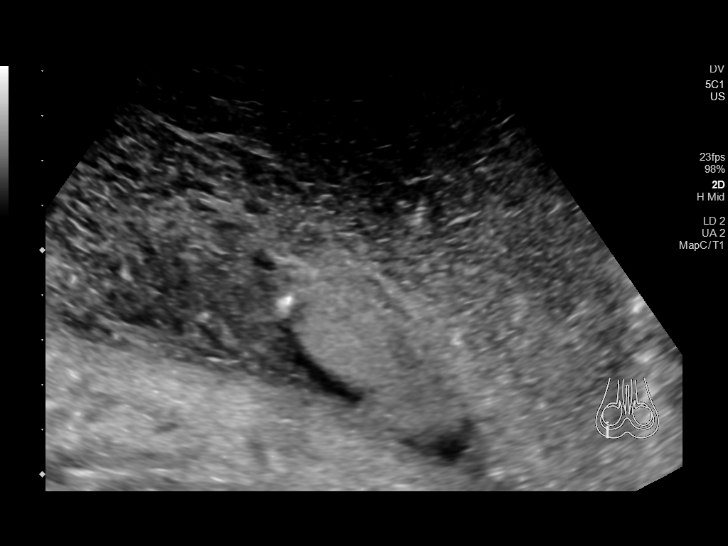
[im 7/41]
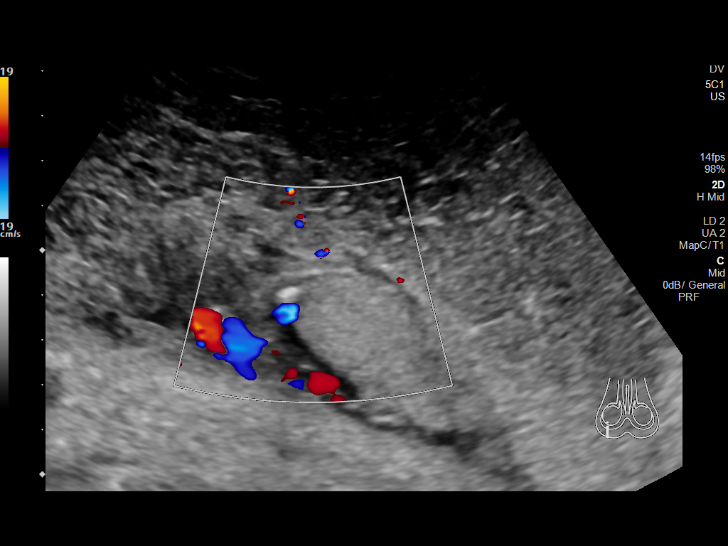
[im 11/41]
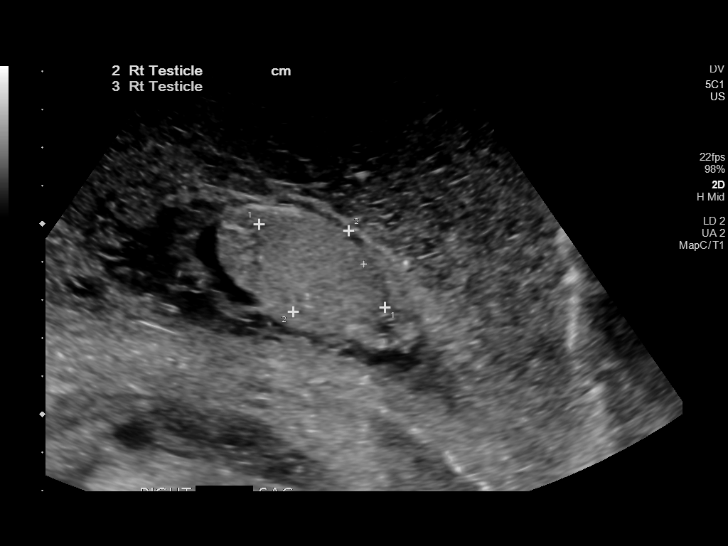
[im 14/41]
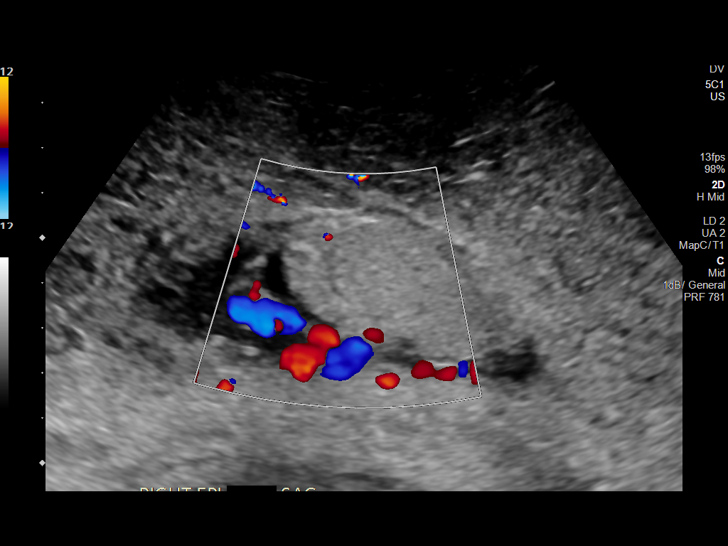
[im 16/41]
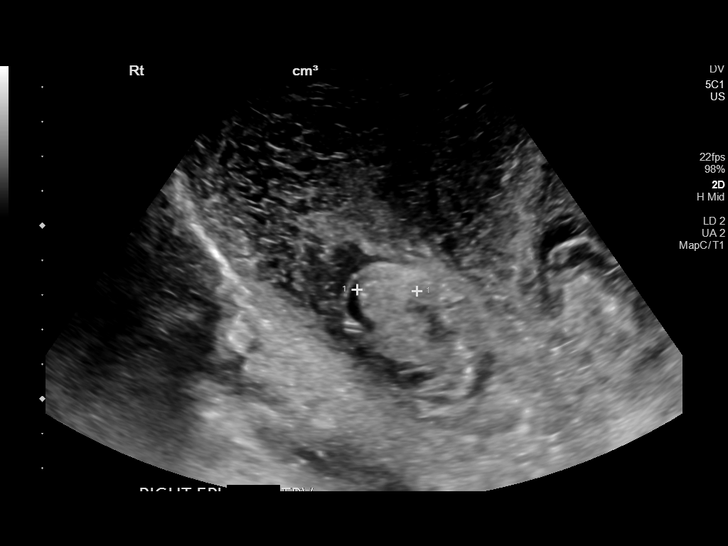
[im 19/41]
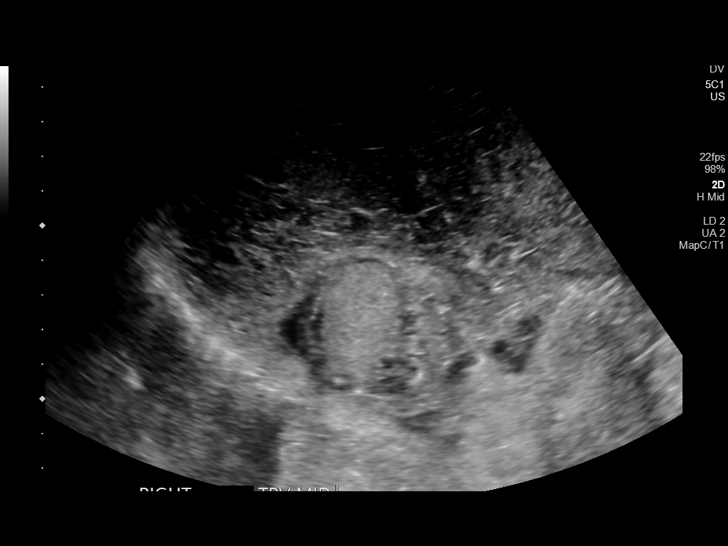
[im 22/41]
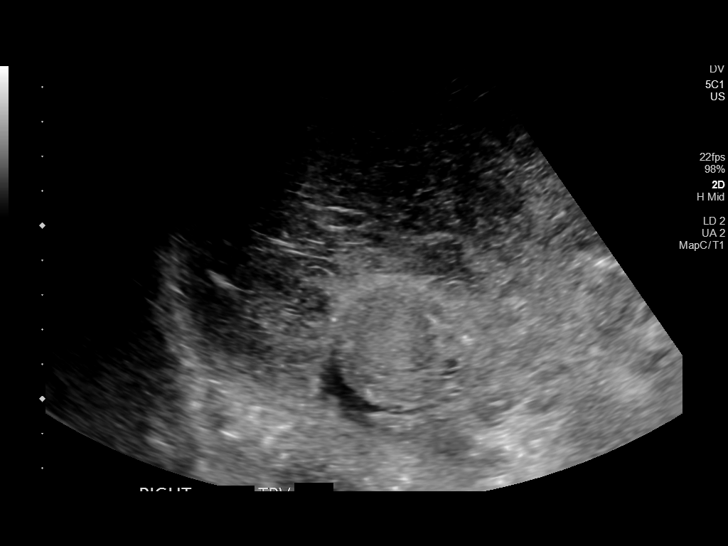
[im 26/41]
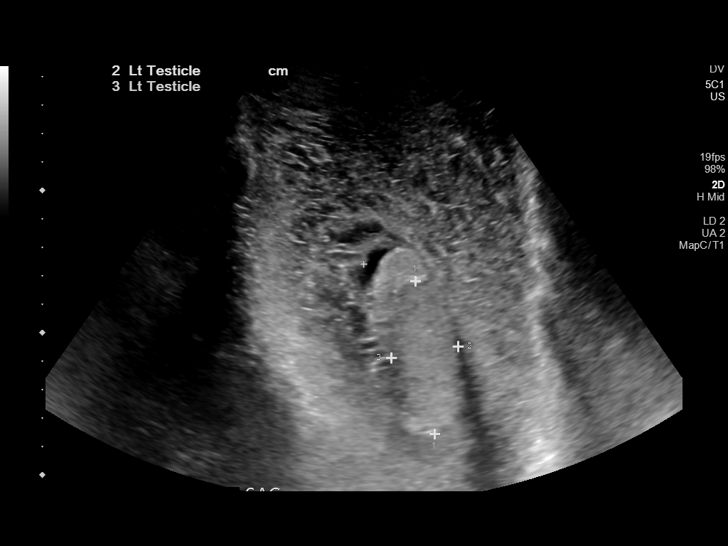
[im 27/41]
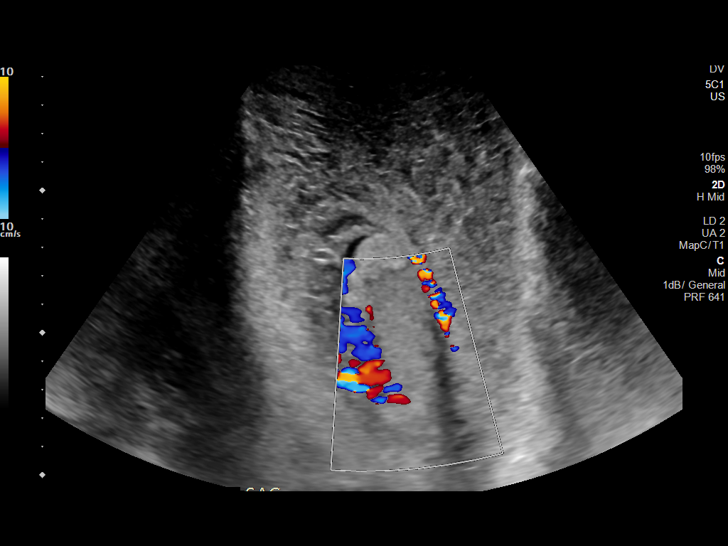
[im 31/41]
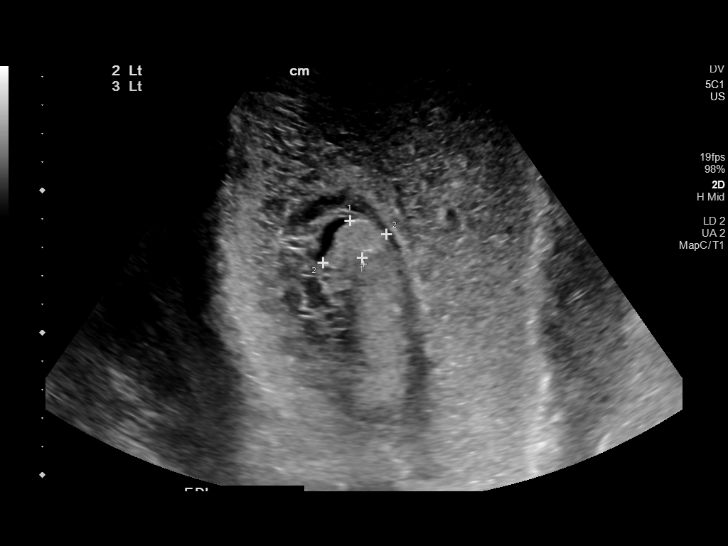
[im 34/41]
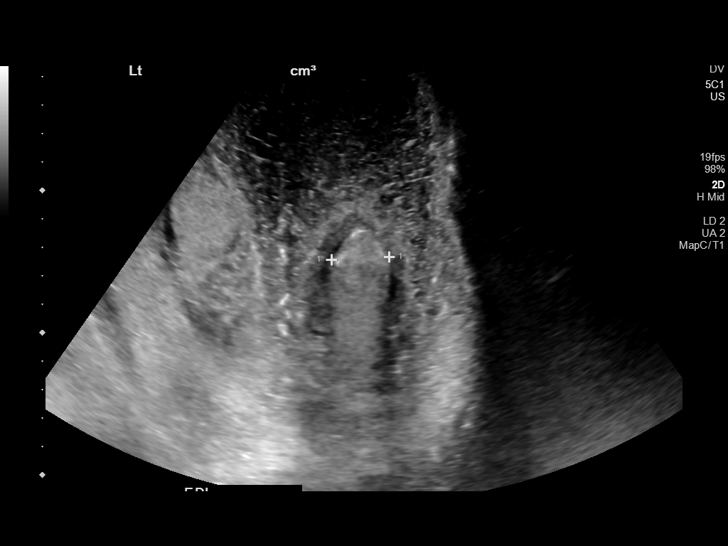
[im 37/41]
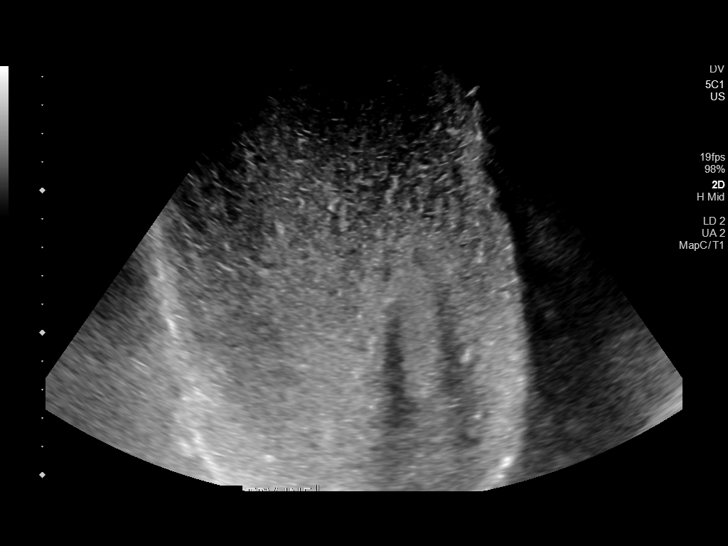
[im 41/41]
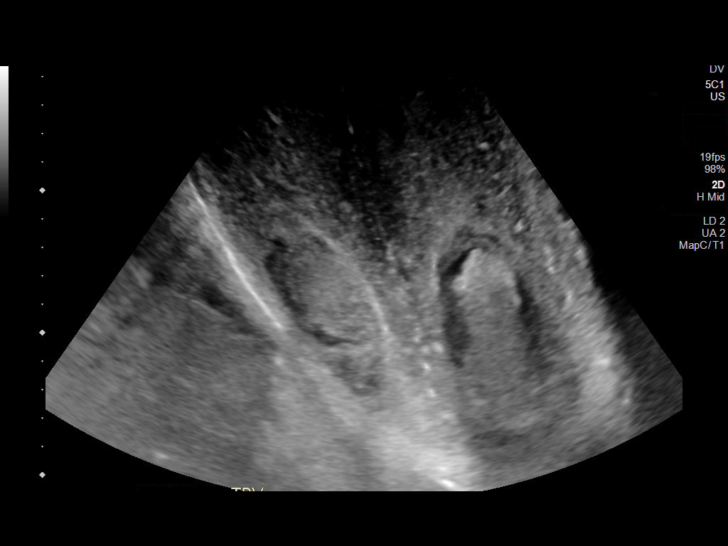

[14 of 25 positions shown; findings below may reference images not displayed]

FINDINGS: Right testicle

Measurements: 4.0 cm x 2.6 cm x 2.4 cm. A 6 mm x 4 mm x 4 mm
calcification is seen adjacent to the superolateral aspect of the
right testicle.

Left testicle

Measurements: 5.4 cm x 2.4 cm x 2.1 cm. No mass or microlithiasis
visualized.

Right epididymis:  Normal in size and appearance.

Left epididymis:  Normal in size and appearance.

Hydrocele: A small amount of fluid is seen adjacent to the bilateral
epididymal heads.

Varicocele:  None visualized.

Other: Marked severity scrotal edema is seen.
IMPRESSION: Marked severity scrotal edema with a small amount of bilateral peri
epididymal fluid.

## 2022-07-31 IMAGING — DX DG CHEST 1V PORT
1 series · 1 of 1 positions shown · non-contrast
Comparison: 11/18/2019

CLINICAL DATA: Central line placement

EXAM:
PORTABLE CHEST 1 VIEW

[chest ap]
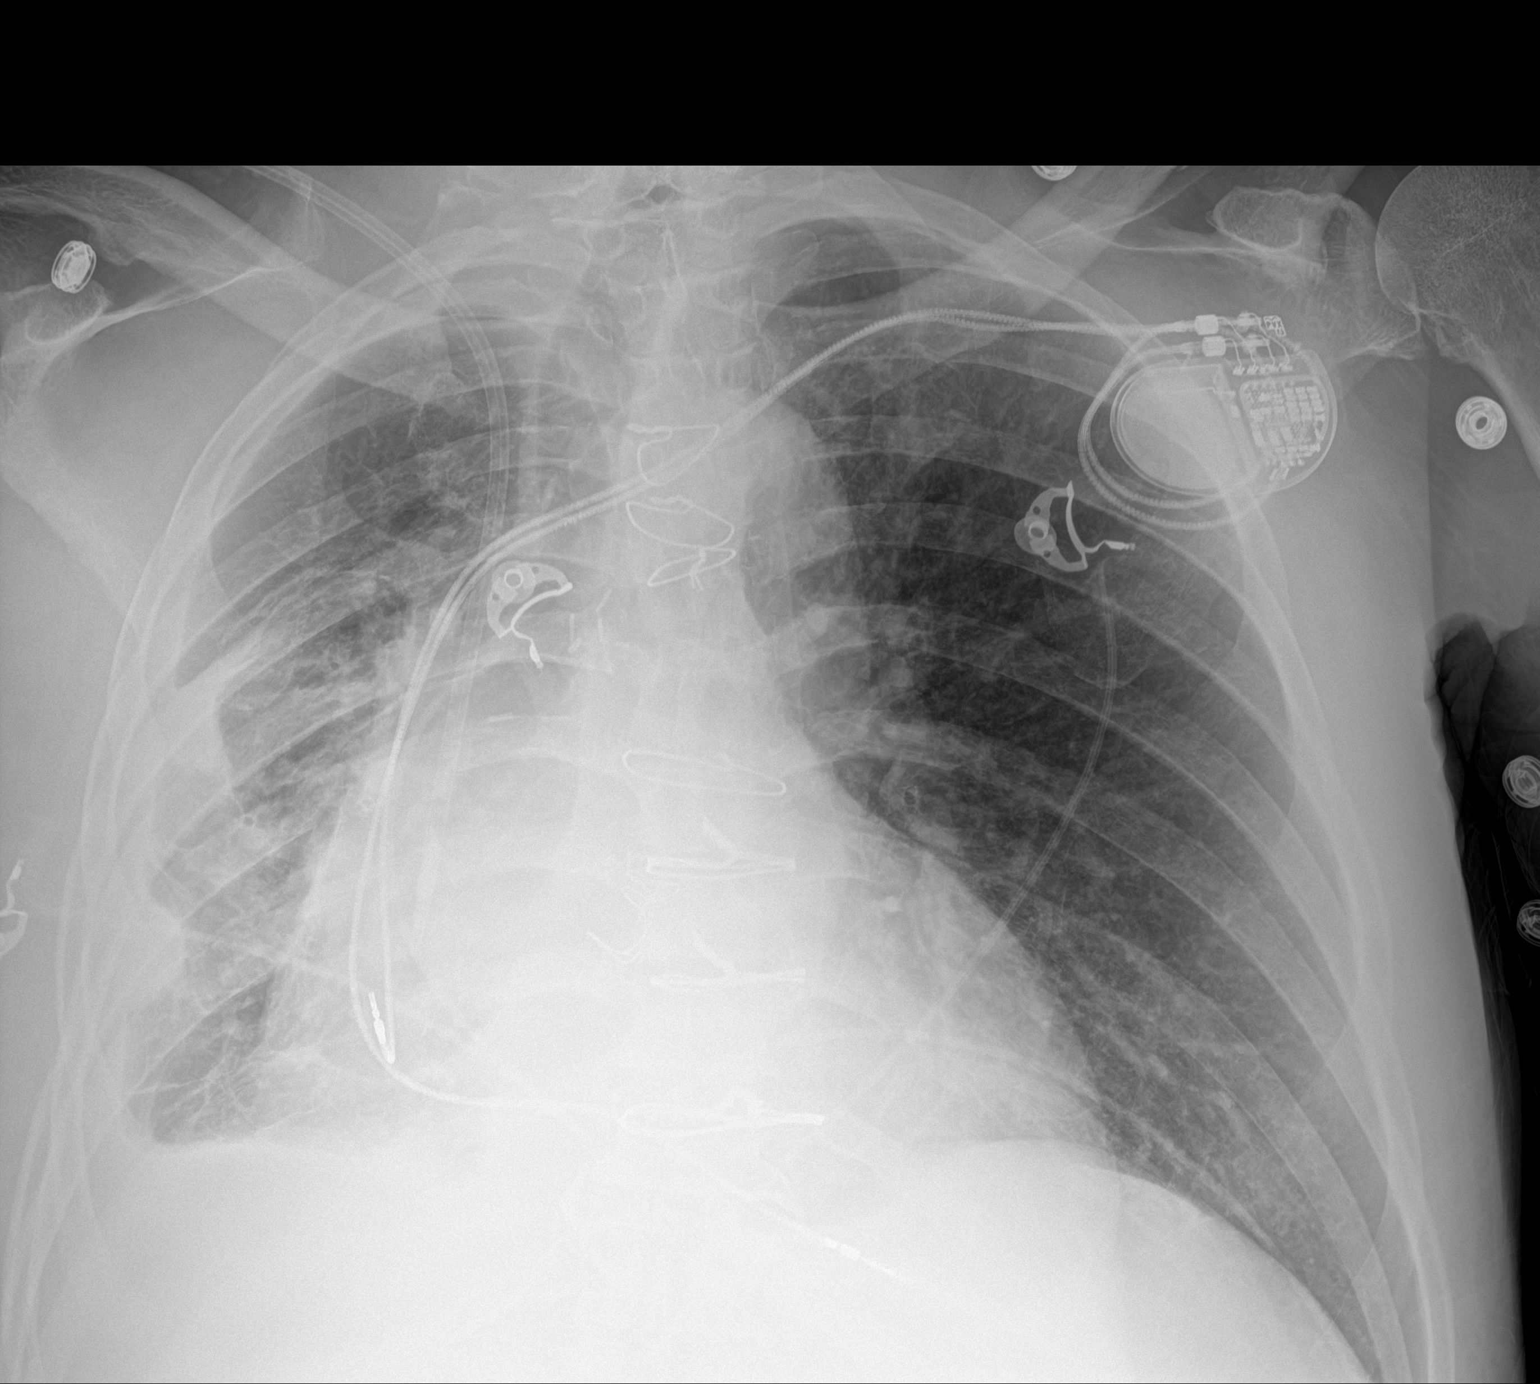

[1 of 1 positions shown; findings below may reference images not displayed]

FINDINGS: There is a new right-sided central venous catheter with tip
projecting over the cavoatrial junction. There is no pneumothorax.
There is a stable appearance of the right lung field with a probable
loculated pleural effusion with adjacent airspace disease. There is
a dual chamber pacemaker in place. There is cardiomegaly.
IMPRESSION: 1. New right-sided central venous catheter with tip projecting over
the cavoatrial junction. No pneumothorax.
2. Otherwise, no significant interval change.
